# Patient Record
Sex: Female | Born: 1985 | Race: Black or African American | Hispanic: No | Marital: Married | Smoking: Current every day smoker
Health system: Southern US, Community
[De-identification: ages and names within clinical notes are randomized; demographics above are authoritative.]

## PROBLEM LIST (undated history)

## (undated) ENCOUNTER — Inpatient Hospital Stay (HOSPITAL_COMMUNITY): Payer: Self-pay

## (undated) DIAGNOSIS — Z8619 Personal history of other infectious and parasitic diseases: Secondary | ICD-10-CM

## (undated) DIAGNOSIS — N83209 Unspecified ovarian cyst, unspecified side: Secondary | ICD-10-CM

## (undated) DIAGNOSIS — O139 Gestational [pregnancy-induced] hypertension without significant proteinuria, unspecified trimester: Secondary | ICD-10-CM

## (undated) DIAGNOSIS — B999 Unspecified infectious disease: Secondary | ICD-10-CM

## (undated) DIAGNOSIS — O165 Unspecified maternal hypertension, complicating the puerperium: Secondary | ICD-10-CM

## (undated) DIAGNOSIS — D259 Leiomyoma of uterus, unspecified: Secondary | ICD-10-CM

## (undated) DIAGNOSIS — I10 Essential (primary) hypertension: Secondary | ICD-10-CM

## (undated) HISTORY — DX: Unspecified maternal hypertension, complicating the puerperium: O16.5

## (undated) HISTORY — PX: NO PAST SURGERIES: SHX2092

## (undated) HISTORY — DX: Essential (primary) hypertension: I10

---
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
## 2007-06-07 ENCOUNTER — Emergency Department (HOSPITAL_COMMUNITY): Admission: EM | Admit: 2007-06-07 | Discharge: 2007-06-07 | Payer: Self-pay | Admitting: Emergency Medicine

## 2010-02-24 ENCOUNTER — Emergency Department (HOSPITAL_COMMUNITY)
Admission: EM | Admit: 2010-02-24 | Discharge: 2010-02-25 | Payer: Self-pay | Source: Home / Self Care | Admitting: Emergency Medicine

## 2010-03-25 ENCOUNTER — Emergency Department (HOSPITAL_COMMUNITY)
Admission: EM | Admit: 2010-03-25 | Discharge: 2010-03-25 | Payer: Self-pay | Source: Home / Self Care | Admitting: Family Medicine

## 2010-03-25 LAB — POCT URINALYSIS DIPSTICK
Nitrite: NEGATIVE
Protein, ur: NEGATIVE mg/dL
Specific Gravity, Urine: 1.025 (ref 1.005–1.030)
Urine Glucose, Fasting: NEGATIVE mg/dL
Urobilinogen, UA: 1 mg/dL (ref 0.0–1.0)
pH: 6.5 (ref 5.0–8.0)

## 2010-03-25 LAB — WET PREP, GENITAL
Clue Cells Wet Prep HPF POC: NONE SEEN
Trich, Wet Prep: NONE SEEN
Yeast Wet Prep HPF POC: NONE SEEN

## 2010-03-25 LAB — POCT PREGNANCY, URINE: Preg Test, Ur: NEGATIVE

## 2010-04-04 LAB — GC/CHLAMYDIA PROBE AMP, GENITAL
Chlamydia, DNA Probe: NEGATIVE
GC Probe Amp, Genital: NEGATIVE

## 2010-12-12 LAB — URINALYSIS, ROUTINE W REFLEX MICROSCOPIC
Bilirubin Urine: NEGATIVE
Glucose, UA: NEGATIVE
Hgb urine dipstick: NEGATIVE
Ketones, ur: 15 — AB
Nitrite: NEGATIVE
Protein, ur: NEGATIVE
Specific Gravity, Urine: 1.029
Urobilinogen, UA: 1
pH: 7.5

## 2010-12-12 LAB — POCT PREGNANCY, URINE
Operator id: 234501
Preg Test, Ur: NEGATIVE

## 2012-09-22 ENCOUNTER — Other Ambulatory Visit (HOSPITAL_COMMUNITY)
Admission: RE | Admit: 2012-09-22 | Discharge: 2012-09-22 | Disposition: A | Discharge status: Home / Self Care | Payer: BC Managed Care – PPO | Source: Ambulatory Visit | Attending: Emergency Medicine | Admitting: Emergency Medicine

## 2012-09-22 ENCOUNTER — Encounter (HOSPITAL_COMMUNITY): Payer: Self-pay | Admitting: Emergency Medicine

## 2012-09-22 ENCOUNTER — Encounter (HOSPITAL_COMMUNITY): Payer: Self-pay | Admitting: *Deleted

## 2012-09-22 ENCOUNTER — Emergency Department (HOSPITAL_COMMUNITY)
Admission: EM | Admit: 2012-09-22 | Discharge: 2012-09-22 | Disposition: A | Discharge status: Nursing Home (Includes ICF) | Payer: BC Managed Care – PPO | Source: Home / Self Care

## 2012-09-22 ENCOUNTER — Emergency Department (HOSPITAL_COMMUNITY)
Admission: EM | Admit: 2012-09-22 | Discharge: 2012-09-22 | Discharge status: Against Medical Advice | Payer: BC Managed Care – PPO | Attending: Emergency Medicine | Admitting: Emergency Medicine

## 2012-09-22 DIAGNOSIS — N760 Acute vaginitis: Secondary | ICD-10-CM | POA: Insufficient documentation

## 2012-09-22 DIAGNOSIS — Z113 Encounter for screening for infections with a predominantly sexual mode of transmission: Secondary | ICD-10-CM | POA: Insufficient documentation

## 2012-09-22 DIAGNOSIS — R109 Unspecified abdominal pain: Secondary | ICD-10-CM | POA: Insufficient documentation

## 2012-09-22 DIAGNOSIS — Z3202 Encounter for pregnancy test, result negative: Secondary | ICD-10-CM | POA: Insufficient documentation

## 2012-09-22 DIAGNOSIS — R110 Nausea: Secondary | ICD-10-CM | POA: Insufficient documentation

## 2012-09-22 DIAGNOSIS — R300 Dysuria: Secondary | ICD-10-CM | POA: Insufficient documentation

## 2012-09-22 LAB — POCT PREGNANCY, URINE
Preg Test, Ur: NEGATIVE
Preg Test, Ur: NEGATIVE

## 2012-09-22 LAB — URINALYSIS, ROUTINE W REFLEX MICROSCOPIC
Glucose, UA: NEGATIVE mg/dL
Ketones, ur: 15 mg/dL — AB
Leukocytes, UA: NEGATIVE
Nitrite: NEGATIVE
Protein, ur: NEGATIVE mg/dL
Specific Gravity, Urine: 1.046 — ABNORMAL HIGH (ref 1.005–1.030)
Urobilinogen, UA: 0.2 mg/dL (ref 0.0–1.0)
pH: 5.5 (ref 5.0–8.0)

## 2012-09-22 LAB — CBC WITH DIFFERENTIAL/PLATELET
Basophils Absolute: 0 10*3/uL (ref 0.0–0.1)
Basophils Relative: 1 % (ref 0–1)
Eosinophils Absolute: 0.1 10*3/uL (ref 0.0–0.7)
Eosinophils Relative: 1 % (ref 0–5)
HCT: 39.9 % (ref 36.0–46.0)
Hemoglobin: 13.9 g/dL (ref 12.0–15.0)
Lymphocytes Relative: 34 % (ref 12–46)
Lymphs Abs: 2.9 10*3/uL (ref 0.7–4.0)
MCH: 30.3 pg (ref 26.0–34.0)
MCHC: 34.8 g/dL (ref 30.0–36.0)
MCV: 87.1 fL (ref 78.0–100.0)
Monocytes Absolute: 0.7 10*3/uL (ref 0.1–1.0)
Monocytes Relative: 8 % (ref 3–12)
Neutro Abs: 4.8 10*3/uL (ref 1.7–7.7)
Neutrophils Relative %: 57 % (ref 43–77)
Platelets: 263 10*3/uL (ref 150–400)
RBC: 4.58 MIL/uL (ref 3.87–5.11)
RDW: 12.2 % (ref 11.5–15.5)
WBC: 8.4 10*3/uL (ref 4.0–10.5)

## 2012-09-22 LAB — POCT I-STAT, CHEM 8
BUN: 6 mg/dL (ref 6–23)
Calcium, Ion: 1.16 mmol/L (ref 1.12–1.23)
Chloride: 102 mEq/L (ref 96–112)
Creatinine, Ser: 0.7 mg/dL (ref 0.50–1.10)
Glucose, Bld: 88 mg/dL (ref 70–99)
HCT: 43 % (ref 36.0–46.0)
Hemoglobin: 14.6 g/dL (ref 12.0–15.0)
Potassium: 3.6 mEq/L (ref 3.5–5.1)
Sodium: 141 mEq/L (ref 135–145)
TCO2: 26 mmol/L (ref 0–100)

## 2012-09-22 LAB — POCT URINALYSIS DIP (DEVICE)
Glucose, UA: NEGATIVE mg/dL
Ketones, ur: NEGATIVE mg/dL
Leukocytes, UA: NEGATIVE
Nitrite: NEGATIVE
Protein, ur: 30 mg/dL — AB
Specific Gravity, Urine: >=1.03 (ref 1.005–1.030)
Urobilinogen, UA: 0.2 mg/dL (ref 0.0–1.0)
pH: 5.5 (ref 5.0–8.0)

## 2012-09-22 LAB — URINE MICROSCOPIC-ADD ON

## 2012-09-22 LAB — POCT RAPID STREP A: Streptococcus, Group A Screen (Direct): NEGATIVE

## 2012-09-22 NOTE — ED Notes (Signed)
The p[t has had abd pain nausea temp,  Painful urination.  lmp nov

## 2012-09-22 NOTE — ED Notes (Signed)
ibu at 1530

## 2012-09-22 NOTE — ED Notes (Signed)
Pt c/o lower abdominal cramping since Saturday. Having nausea denies vomiting. Pt states that she has not had a cycle since November. Pt had one injection of depo and then discontinued.  Pt has used pain med left over from dental surgery which helped relieve pain.  Pt is sitting up right no signs of distress.

## 2012-09-23 NOTE — ED Provider Notes (Signed)
   History    CSN: [id number] Arrival date & time 09/22/12  1847  First MD Initiated Contact with Patient 09/22/12 1935     Chief Complaint  Patient presents with  . Abdominal Pain    lower abdominal pain since saturday. pain is a constant cramping sensation.    (Consider location/radiation/quality/duration/timing/severity/associated sxs/prior Treatment) HPI  27 yo bf presents with abd pain that has been worsening the last 24 yours.  Described as being constant with cramping.  Some nausea, and chills.  Denies vomiting, dysuria, hematuria, constipaton, diarrhea, bloody stools.  Has not had a cycle since November 2013.  No vaginal bleeding or discharge.  History reviewed. No pertinent past medical history. History reviewed. No pertinent past surgical history. History reviewed. No pertinent family history. History  Substance Use Topics  . Smoking status: Never Smoker   . Smokeless tobacco: Not on file  . Alcohol Use: No   OB History   Grav Para Term Preterm Abortions TAB SAB Ect Mult Living                 Review of Systems  Constitutional: Positive for chills. Negative for appetite change.  HENT: Negative.   Eyes: Negative.   Respiratory: Negative.   Cardiovascular: Negative.   Gastrointestinal: Positive for nausea and abdominal pain. Negative for vomiting, diarrhea, constipation, blood in stool, anal bleeding and rectal pain.  Endocrine: Negative.   Genitourinary: Positive for menstrual problem and pelvic pain. Negative for dysuria, hematuria, flank pain, vaginal bleeding, vaginal discharge, difficulty urinating and vaginal pain.  Musculoskeletal: Negative.   Skin: Negative.   Psychiatric/Behavioral: Negative.     Allergies  Review of patient's allergies indicates no known allergies.  Home Medications  No current outpatient prescriptions on file. BP 148/86  Pulse 86  Temp(Src) 100.3 F (37.9 C) (Oral)  Resp 16  SpO2 95%  LMP 01/24/2012 Physical Exam   Constitutional: She is oriented to person, place, and time. She appears well-developed and well-nourished.  HENT:  Head: Normocephalic.  Eyes: EOM are normal. Pupils are equal, round, and reactive to light.  Neck: Thyromegaly present.  Cardiovascular: Normal rate and regular rhythm.   Pulmonary/Chest: Effort normal and breath sounds normal.  Abdominal: Soft. Bowel sounds are normal. She exhibits no mass. There is tenderness (diffuse).  Musculoskeletal: Normal range of motion.  Neurological: She is alert and oriented to person, place, and time.  Skin: Skin is warm and dry.  Psychiatric: She has a normal mood and affect.    ED Course  Procedures (including critical care time) Labs Reviewed  POCT URINALYSIS DIP (DEVICE) - Abnormal; Notable for the following:    Bilirubin Urine SMALL (*)    Hgb urine dipstick MODERATE (*)    Protein, ur 30 (*)    All other components within normal limits  CBC WITH DIFFERENTIAL  POCT PREGNANCY, URINE  POCT I-STAT, CHEM 8  POCT RAPID STREP A ([facility] CARE ONLY)  CERVICOVAGINAL ANCILLARY ONLY   No results found. No diagnosis found.  MDM  With worsening abdominal pain and increased temp, we will send patient down to ED for furter evaluation.  Patient voices understanding.  Discussed with dr Tressia Danas.   Zonia Kief, PA-C 09/23/12 [phone]

## 2012-09-24 LAB — CULTURE, GROUP A STREP

## 2012-09-26 NOTE — ED Provider Notes (Signed)
Medical screening examination/treatment/procedure(s) were performed by non-physician practitioner and as supervising physician I was immediately available for consultation/collaboration.   [facility]; MD  Sharin Grave, MD 09/26/12 [phone]

## 2013-08-15 DIAGNOSIS — R42 Dizziness and giddiness: Secondary | ICD-10-CM | POA: Insufficient documentation

## 2014-06-28 ENCOUNTER — Emergency Department (HOSPITAL_COMMUNITY)
Admission: EM | Admit: 2014-06-28 | Discharge: 2014-06-28 | Disposition: A | Discharge status: Home / Self Care | Payer: Self-pay | Attending: Emergency Medicine | Admitting: Emergency Medicine

## 2014-06-28 ENCOUNTER — Encounter (HOSPITAL_COMMUNITY): Payer: Self-pay | Admitting: Emergency Medicine

## 2014-06-28 DIAGNOSIS — N898 Other specified noninflammatory disorders of vagina: Secondary | ICD-10-CM | POA: Insufficient documentation

## 2014-06-28 DIAGNOSIS — Z3202 Encounter for pregnancy test, result negative: Secondary | ICD-10-CM | POA: Insufficient documentation

## 2014-06-28 LAB — WET PREP, GENITAL
Clue Cells Wet Prep HPF POC: NONE SEEN
Trich, Wet Prep: NONE SEEN
Yeast Wet Prep HPF POC: NONE SEEN

## 2014-06-28 LAB — URINALYSIS, ROUTINE W REFLEX MICROSCOPIC
Bilirubin Urine: NEGATIVE
Glucose, UA: NEGATIVE mg/dL
Hgb urine dipstick: NEGATIVE
Ketones, ur: NEGATIVE mg/dL
Leukocytes, UA: NEGATIVE
Nitrite: NEGATIVE
Protein, ur: NEGATIVE mg/dL
Specific Gravity, Urine: 1.027 (ref 1.005–1.030)
Urobilinogen, UA: 0.2 mg/dL (ref 0.0–1.0)
pH: 5.5 (ref 5.0–8.0)

## 2014-06-28 LAB — RPR: RPR Ser Ql: NONREACTIVE

## 2014-06-28 MED ORDER — AZITHROMYCIN 250 MG PO TABS
1000.0000 mg | ORAL_TABLET | Freq: Once | ORAL | Status: AC
  Administered 2014-06-28: 1000 mg via ORAL
  Filled 2014-06-28: qty 4

## 2014-06-28 MED ORDER — CEFTRIAXONE SODIUM 250 MG IJ SOLR
250.0000 mg | Freq: Once | INTRAMUSCULAR | Status: AC
  Administered 2014-06-28: 250 mg via INTRAMUSCULAR
  Filled 2014-06-28: qty 250

## 2014-06-28 NOTE — ED Provider Notes (Signed)
CSN: [id number]     Arrival date & time 06/28/14  9485 History   First MD Initiated Contact with Patient 06/28/14 [phone]     Chief Complaint  Patient presents with  . Vaginal Itching     (Consider location/radiation/quality/duration/timing/severity/associated sxs/prior Treatment) HPI Comments: Yellow clumpy d/c, since Friday. Itching since Wednesday Unprotected sex 1 wk ago No fevers, no ha, no sob / joint pain / cough.   Constant Mild Nothing makes better or worse. Tried monostat without improvement.   Patient is a 29 y.o. female presenting with vaginal itching. The history is provided by the patient.  Vaginal Itching    History reviewed. No pertinent past medical history. History reviewed. No pertinent past surgical history. No family history on file. History  Substance Use Topics  . Smoking status: Never Smoker   . Smokeless tobacco: Not on file  . Alcohol Use: No   OB History    No data available     Review of Systems  All other systems reviewed and are negative.     Allergies  Review of patient's allergies indicates no known allergies.  Home Medications   Prior to Admission medications   Not on File   BP 127/86 mmHg  Pulse 58  Temp(Src) 98.2 F (36.8 C) (Oral)  SpO2 100%  LMP 06/03/2014 (Exact Date) Physical Exam  Constitutional: She appears well-developed and well-nourished. No distress.  HENT:  Head: Normocephalic and atraumatic.  Mouth/Throat: Oropharynx is clear and moist. No oropharyngeal exudate.  Eyes: Conjunctivae and EOM are normal. Pupils are equal, round, and reactive to light. Right eye exhibits no discharge. Left eye exhibits no discharge. No scleral icterus.  Neck: Normal range of motion. Neck supple. No JVD present. No thyromegaly present.  Cardiovascular: Normal rate, regular rhythm, normal heart sounds and intact distal pulses.  Exam reveals no gallop and no friction rub.   No murmur heard. Pulmonary/Chest: Effort normal and  breath sounds normal. No respiratory distress. She has no wheezes. She has no rales.  Abdominal: Soft. Bowel sounds are normal. She exhibits no distension and no mass. There is no tenderness.  Genitourinary:  Chaperone present for exam, copious yello and green d/c, no bleeding, no CMT or adnexal ttp or fullness.  Musculoskeletal: Normal range of motion. She exhibits no edema or tenderness.  Lymphadenopathy:    She has no cervical adenopathy.  Neurological: She is alert. Coordination normal.  Skin: Skin is warm and dry. No rash noted. No erythema.  Psychiatric: She has a normal mood and affect. Her behavior is normal.  Nursing note and vitals reviewed.   ED Course  Procedures (including critical care time) Labs Review Labs Reviewed  WET PREP, GENITAL - Abnormal; Notable for the following:    WBC, Wet Prep HPF POC FEW (*)    All other components within normal limits  URINALYSIS, ROUTINE W REFLEX MICROSCOPIC  RPR  HIV ANTIBODY (ROUTINE TESTING)  POC URINE PREG, ED  GC/CHLAMYDIA PROBE AMP ([place])    Imaging Review No results found.   MDM   Final diagnoses:  Vaginal discharge    STD testing pending, vital signs normal, patient otherwise unremarkable  Wet prep negative - VS normal, UA neg - preg neg - cover for STD'  Meds given in ED:  Medications  azithromycin (ZITHROMAX) tablet 1,000 mg (not administered)  cefTRIAXone (ROCEPHIN) injection 250 mg (not administered)    New Prescriptions   No medications on file       Noemi Chapel, MD  06/28/14 0628 

## 2014-06-28 NOTE — ED Notes (Signed)
Vag itching with yellowish discharge-- has had unprotected sex 2 weeks.

## 2014-06-28 NOTE — Discharge Instructions (Signed)
Pelvic Infection ° °If you have been diagnosed with a pelvic infection such as a sexually transmitted disease, you will need to be treated with antibiotics. Please take the medicines as prescribed. Some of these tests do not come back for 1-2 days in which case if they turn positive you will receive a phone call to let you know. If you are contacted and do have an infection consistent with a sexually transmitted disease, then you will need to tell any and all sexual partners that you have had in the last 6 months no so that they can be tested and treated as well. If you should develop severe or worsening pain in your abdomen or the pelvis or develop severe fevers,nausea or vomiting that prevent you from taking your medications, return to the emergency department immediately. Otherwise contact your local physician or county health department for a follow up appointment to complete STD testing including HIV and syphilis.  See the list of phone numbers below. ° °RESOURCE GUIDE ° °Dental Problems ° °Patients with Medicaid: °[facility]                     [facility] °[address]                                           [address] °Phone:  [phone]                                                  Phone:  [phone] ° °If unable to pay or uninsured, contact:  [facility] or [facility] to become qualified for the adult dental clinic. ° °Chronic Pain Problems °Contact [facility]  [phone] °Patients need to be referred by their primary care doctor. ° °Insufficient Money for Medicine °Contact [employer]:  call "211" or [facility] 271-5999. ° °No Primary Care Doctor °Call [facility]  832-8000 °Other agencies that provide inexpensive medical care °   [facility]  [phone] °   [facility]  [phone] °   [facility]  [phone] °   [facility]  [phone] °   [facility]  [phone] ° [facility]  [phone] ° °Psychological Services °[facility]  [phone] °[facility]  [phone] °[facility]   [phone] (emergency services [phone]) ° °Substance Abuse Resources °[facility]  336-882-2125 °[facility] 336-784-9470 °[facility] 336-285-9073 °[facility] 336-845-3988 °Residential & Outpatient Substance Abuse Program  800-659-3381 ° °Abuse/Neglect °[place] [facility] (336) 641-3795 °[place] [facility] Hotline 800-378-5315 (After Hours) ° °Emergency Shelter °[facility] (336) 271-5985 ° °Maternity Homes °[facility] (336) 275-9566 °[facility] (704) 372-4663 ° °MRSA Hotline #:   832-7006 ° ° ° °[place] [facility] ° °[facility] [place] [facility]     [employer]                          [facility] °[address] [place]                         [address]      [address]  °[place]                                                [place]                            [place] °Phone:  349-3220                                   Phone:  [phone]                 Phone:  342-8140 ° °[facility] °Phone:  342-8316 ° °[place] Child Abuse Hotline °(336) 342-1394 °(336) 342-3537 (After Hours) ° ° ° °

## 2014-06-30 LAB — HIV ANTIBODY (ROUTINE TESTING W REFLEX): HIV Screen 4th Generation wRfx: NONREACTIVE

## 2015-01-30 ENCOUNTER — Emergency Department (HOSPITAL_COMMUNITY)
Admission: EM | Admit: 2015-01-30 | Discharge: 2015-01-30 | Disposition: A | Discharge status: Home / Self Care | Payer: Self-pay | Attending: Emergency Medicine | Admitting: Emergency Medicine

## 2015-01-30 ENCOUNTER — Encounter (HOSPITAL_COMMUNITY): Payer: Self-pay | Admitting: Emergency Medicine

## 2015-01-30 DIAGNOSIS — K0889 Other specified disorders of teeth and supporting structures: Secondary | ICD-10-CM | POA: Insufficient documentation

## 2015-01-30 DIAGNOSIS — Z720 Tobacco use: Secondary | ICD-10-CM | POA: Insufficient documentation

## 2015-01-30 DIAGNOSIS — H9202 Otalgia, left ear: Secondary | ICD-10-CM | POA: Insufficient documentation

## 2015-01-30 MED ORDER — BUPIVACAINE HCL (PF) 0.5 % IJ SOLN
30.0000 mL | Freq: Once | INTRAMUSCULAR | Status: AC
  Administered 2015-01-30: 30 mL
  Filled 2015-01-30: qty 30

## 2015-01-30 MED ORDER — IBUPROFEN 400 MG PO TABS
600.0000 mg | ORAL_TABLET | Freq: Once | ORAL | Status: AC
  Administered 2015-01-30: 600 mg via ORAL
  Filled 2015-01-30 (×2): qty 1

## 2015-01-30 MED ORDER — BUPIVACAINE HCL 0.5 % IJ SOLN
50.0000 mL | Freq: Once | INTRAMUSCULAR | Status: DC
  Filled 2015-01-30: qty 50

## 2015-01-30 MED ORDER — IBUPROFEN 600 MG PO TABS: 600.0000 mg | ORAL_TABLET | Freq: Three times a day (TID) | ORAL | Status: DC | PRN

## 2015-01-30 MED ORDER — PENICILLIN V POTASSIUM 500 MG PO TABS: 500.0000 mg | ORAL_TABLET | Freq: Three times a day (TID) | ORAL | Status: DC

## 2015-01-30 MED ORDER — PENICILLIN V POTASSIUM 250 MG PO TABS
500.0000 mg | ORAL_TABLET | Freq: Once | ORAL | Status: AC
  Administered 2015-01-30: 500 mg via ORAL
  Filled 2015-01-30: qty 2

## 2015-01-30 NOTE — ED Notes (Signed)
Pt reports dental pain x 1 month that has progressed into L ear pain x 4 days; pt states decreased sleep d/t pain; ibuprofen "helps but wears off quickly"

## 2015-01-30 NOTE — ED Provider Notes (Signed)
CSN: [id number]     Arrival date & time 01/30/15  [id number] History   First MD Initiated Contact with Patient 01/30/15 [phone]     Chief Complaint  Patient presents with  . Dental Pain  . Otalgia     HPI Patient presents to the emergency department complaining of left upper molar dental pain worsening over the past 2 weeks.  She states the pain radiates which her left ear.  No fevers or chills.  She takes ibuprofen and this helps.  She has not seen a dentist.  No other complaints at this time.  No facial swelling and neck swelling.   History reviewed. No pertinent past medical history. History reviewed. No pertinent past surgical history. History reviewed. No pertinent family history. Social History  Substance Use Topics  . Smoking status: Current Some Day Smoker -- 0.50 packs/day    Types: Cigarettes  . Smokeless tobacco: None  . Alcohol Use: Yes     Comment: occassionally   OB History    No data available     Review of Systems  All other systems reviewed and are negative.     Allergies  Review of patient's allergies indicates no known allergies.  Home Medications   Prior to Admission medications   Medication Sig Start Date End Date Taking? Authorizing Provider  ibuprofen (ADVIL,MOTRIN) 600 MG tablet Take 1 tablet (600 mg total) by mouth every 8 (eight) hours as needed. 01/30/15   Jola Schmidt, MD  penicillin v potassium (VEETID) 500 MG tablet Take 1 tablet (500 mg total) by mouth 3 (three) times daily. 01/30/15   Jola Schmidt, MD   BP 129/82 mmHg  Pulse 65  Temp(Src) 97.9 F (36.6 C) (Oral)  Resp 16  SpO2 99%  LMP 01/19/2015 Physical Exam  Constitutional: She is oriented to person, place, and time. She appears well-developed and well-nourished.  HENT:  Head: Normocephalic.  Eyes: EOM are normal.  Neck: Normal range of motion.  Pulmonary/Chest: Effort normal.  Abdominal: She exhibits no distension.  Musculoskeletal: Normal range of motion.  Neurological: She is  alert and oriented to person, place, and time.  Psychiatric: She has a normal mood and affect.  Nursing note and vitals reviewed.   ED Course  Procedures (including critical care time)  DENTAL NERVE BLOCK Performed by: Hoy Morn Consent: Verbal consent obtained. Required items: required blood products, implants, devices, and special equipment available Time out: Immediately prior to procedure a "time out" was called to verify the correct patient, procedure, equipment, support staff and site/side marked as required. Indication: Dental pain  Nerve block body site: Dental nerve  Preparation: Patient was prepped and draped in the usual sterile fashion. Needle gauge: 24 G Location technique: anatomical landmarks Local anesthetic: Bupivacaine 0.5% without epi  Anesthetic total: 1.5 ml Outcome: pain improved Patient tolerance: Patient tolerated the procedure well with no immediate complications.    Labs Review Labs Reviewed - No data to display  Imaging Review No results found. I have personally reviewed and evaluated these images and lab results as part of my medical decision-making.   EKG Interpretation None      MDM   Final diagnoses:  Pain, dental    Dental Pain. Home with antibiotics and pain medicine. Recommend dental follow up. No signs of gingival abscess. Tolerating secretions. Airway patent. No sub lingular swelling     Jola Schmidt, MD 01/30/15 [phone]

## 2015-02-16 ENCOUNTER — Other Ambulatory Visit: Payer: Self-pay | Admitting: *Deleted

## 2015-02-16 ENCOUNTER — Telehealth: Payer: Self-pay | Admitting: *Deleted

## 2015-02-16 DIAGNOSIS — H9203 Otalgia, bilateral: Secondary | ICD-10-CM

## 2015-02-16 NOTE — Telephone Encounter (Signed)
NCM called to inform pt of referral sent to Dr Maurilio Lovely office.  Instructed pt to make appointment within 48 hours to receive care.

## 2015-02-17 ENCOUNTER — Emergency Department (HOSPITAL_COMMUNITY)
Admission: EM | Admit: 2015-02-17 | Discharge: 2015-02-17 | Disposition: A | Discharge status: Home / Self Care | Payer: Self-pay | Attending: Emergency Medicine | Admitting: Emergency Medicine

## 2015-02-17 ENCOUNTER — Encounter (HOSPITAL_COMMUNITY): Payer: Self-pay

## 2015-02-17 DIAGNOSIS — F17210 Nicotine dependence, cigarettes, uncomplicated: Secondary | ICD-10-CM | POA: Insufficient documentation

## 2015-02-17 DIAGNOSIS — N760 Acute vaginitis: Secondary | ICD-10-CM | POA: Insufficient documentation

## 2015-02-17 DIAGNOSIS — B379 Candidiasis, unspecified: Secondary | ICD-10-CM

## 2015-02-17 DIAGNOSIS — Z3202 Encounter for pregnancy test, result negative: Secondary | ICD-10-CM | POA: Insufficient documentation

## 2015-02-17 DIAGNOSIS — B373 Candidiasis of vulva and vagina: Secondary | ICD-10-CM | POA: Insufficient documentation

## 2015-02-17 DIAGNOSIS — B9689 Other specified bacterial agents as the cause of diseases classified elsewhere: Secondary | ICD-10-CM

## 2015-02-17 LAB — COMPREHENSIVE METABOLIC PANEL
ALT: 14 U/L (ref 14–54)
AST: 21 U/L (ref 15–41)
Albumin: 4 g/dL (ref 3.5–5.0)
Alkaline Phosphatase: 72 U/L (ref 38–126)
Anion gap: 5 (ref 5–15)
BUN: 9 mg/dL (ref 6–20)
CO2: 29 mmol/L (ref 22–32)
Calcium: 9.2 mg/dL (ref 8.9–10.3)
Chloride: 105 mmol/L (ref 101–111)
Creatinine, Ser: 0.58 mg/dL (ref 0.44–1.00)
GFR calc Af Amer: >60 mL/min (ref >60)
GFR calc non Af Amer: >60 mL/min (ref >60)
Glucose, Bld: 97 mg/dL (ref 65–99)
Potassium: 3.8 mmol/L (ref 3.5–5.1)
Sodium: 139 mmol/L (ref 135–145)
Total Bilirubin: 0.5 mg/dL (ref 0.3–1.2)
Total Protein: 6.9 g/dL (ref 6.5–8.1)

## 2015-02-17 LAB — CBC
HCT: 41.1 % (ref 36.0–46.0)
Hemoglobin: 14.2 g/dL (ref 12.0–15.0)
MCH: 29.8 pg (ref 26.0–34.0)
MCHC: 34.5 g/dL (ref 30.0–36.0)
MCV: 86.3 fL (ref 78.0–100.0)
Platelets: 311 10*3/uL (ref 150–400)
RBC: 4.76 MIL/uL (ref 3.87–5.11)
RDW: 12.4 % (ref 11.5–15.5)
WBC: 10.3 10*3/uL (ref 4.0–10.5)

## 2015-02-17 LAB — URINALYSIS, ROUTINE W REFLEX MICROSCOPIC
Bilirubin Urine: NEGATIVE
Glucose, UA: NEGATIVE mg/dL
Hgb urine dipstick: NEGATIVE
Ketones, ur: NEGATIVE mg/dL
Leukocytes, UA: NEGATIVE
Nitrite: NEGATIVE
Protein, ur: NEGATIVE mg/dL
Specific Gravity, Urine: 1.024 (ref 1.005–1.030)
pH: 6 (ref 5.0–8.0)

## 2015-02-17 LAB — WET PREP, GENITAL
Sperm: NONE SEEN
Trich, Wet Prep: NONE SEEN
Yeast Wet Prep HPF POC: NONE SEEN

## 2015-02-17 LAB — I-STAT BETA HCG BLOOD, ED (MC, WL, AP ONLY): I-stat hCG, quantitative: <5 m[IU]/mL (ref <5)

## 2015-02-17 LAB — LIPASE, BLOOD: Lipase: 29 U/L (ref 11–51)

## 2015-02-17 MED ORDER — FLUCONAZOLE 200 MG PO TABS
200.0000 mg | ORAL_TABLET | Freq: Once | ORAL | Status: AC
  Administered 2015-02-17: 200 mg via ORAL
  Filled 2015-02-17: qty 1

## 2015-02-17 MED ORDER — METRONIDAZOLE 500 MG PO TABS: 500.0000 mg | ORAL_TABLET | Freq: Three times a day (TID) | ORAL | Status: DC

## 2015-02-17 MED ORDER — METRONIDAZOLE 500 MG PO TABS
500.0000 mg | ORAL_TABLET | Freq: Once | ORAL | Status: AC
  Administered 2015-02-17: 500 mg via ORAL
  Filled 2015-02-17: qty 1

## 2015-02-17 MED ORDER — CEFTRIAXONE SODIUM 250 MG IJ SOLR
250.0000 mg | Freq: Once | INTRAMUSCULAR | Status: AC
  Administered 2015-02-17: 250 mg via INTRAMUSCULAR
  Filled 2015-02-17: qty 250

## 2015-02-17 MED ORDER — STERILE WATER FOR INJECTION IJ SOLN
INTRAMUSCULAR | Status: AC
  Administered 2015-02-17: 10 mL
  Filled 2015-02-17: qty 10

## 2015-02-17 MED ORDER — AZITHROMYCIN 600 MG PO TABS
1200.0000 mg | ORAL_TABLET | Freq: Once | ORAL | Status: AC
  Administered 2015-02-17: 1200 mg via ORAL
  Filled 2015-02-17: qty 2

## 2015-02-17 NOTE — ED Notes (Signed)
Pt c/o lower abdominal cramping and vaginal discharge x 1 week.  Pain score 6/10.  Pt reports discharge is whitish-yellow and denies odor.  Pt reports using Monistat w/o relief.  Sts she was recently on penicillin for a tooth.

## 2015-02-17 NOTE — Discharge Instructions (Signed)
°Emergency Department Resource Guide °1) Find a Doctor and Pay Out of Pocket °Although you won't have to find out who is covered by your insurance plan, it is a good idea to ask around and get recommendations. You will then need to call the office and see if the doctor you have chosen will accept you as a new patient and what types of options they offer for patients who are self-pay. Some doctors offer discounts or will set up payment plans for their patients who do not have insurance, but you will need to ask so you aren't surprised when you get to your appointment. ° °2) Contact Your Local Health Department °Not all health departments have doctors that can see patients for sick visits, but many do, so it is worth a call to see if yours does. If you don't know where your local health department is, you can check in your phone book. The CDC also has a tool to help you locate your state's health department, and many state websites also have listings of all of their local health departments. ° °3) Find a Walk-in Clinic °If your illness is not likely to be very severe or complicated, you may want to try a walk in clinic. These are popping up all over the country in pharmacies, drugstores, and shopping centers. They're usually staffed by nurse practitioners or physician assistants that have been trained to treat common illnesses and complaints. They're usually fairly quick and inexpensive. However, if you have serious medical issues or chronic medical problems, these are probably not your best option. ° °No Primary Care Doctor: °- Call [facility] at  832-8000 - they can help you locate a primary care doctor that  accepts your insurance, provides certain services, etc. °- Physician Referral Service- 1-800-533-3463 ° °Chronic Pain Problems: °Organization         Address  Phone   Notes  °[facility]  (336) 297-2271 Patients need to be referred by their primary care doctor.  ° °Medication  Assistance: °Organization         Address  Phone   Notes  °[place] [facility] [address] °[place], [place] [address] (336) 641-8030 --Must be a resident of [place] °-- Must have NO insurance coverage whatsoever (no Medicaid/ Medicare, etc.) °-- The pt. MUST have a primary care doctor that directs their care regularly and follows them in the community °  °[facility]  (866) 331-1348   °[employer]  (888) 892-1162   ° °Agencies that provide inexpensive medical care: °Organization         Address  Phone   Notes  °[facility]  (336) 832-8035   °[facility]    (336) 832-7272   °[facility] [address] °[place], [place] [address] (336) 832-4777   °[facility] [address], °[place] (336) 271-4999   °[facility]    (336) 373-0678   °[facility]    (336) 272-1050   °[facility] ° [address], [place] Phone:  (336) 832-4444, Fax:  (336) 832-4440 Hours of Operation:  9 am - 6 pm, M-F.  Also accepts Medicaid/Medicare and self-pay.  °[facility] ° [address],  Phone: (336) 832-3150, Fax: (336) 832-3151. Hours of Operation:  8:30 am - 5:30 pm, M-F.  Also accepts Medicaid and self-pay.  °[facility] [place] [address], [place] Phone: (336) 878-6027   °[facility] [address], [place], [place] (336)723-1848, Ext. 123 Mondays & Thursdays: 7-9 AM.  First 15 patients are seen on a first come, first serve basis. °  ° °Medicaid-accepting [place] Providers: ° °Organization         Address  Phone   Notes  °[facility] [address], [place] (336) 641-2100 Also accepts self-pay patients.  °[facility] [address], [place] ° (336) 856-9996   °[facility] [address], [place]  (336) 288-8857   °[facility] [address] [place] [address], [place] (336) 299-7000   °Veita Bland [address], [place]  ° (336) 373-1557 Only accepts [place] Access Medicaid patients after they have their name applied to their card.  ° °Self-Pay (no insurance) in [place]: ° °Organization         Address  Phone   Notes  °Sickle Cell Patients, [facility] [address], [place] (336) 832-1970   °[facility] [address], [place] (336) 832-4400   °[facility] [place] ° [address], [place] (336) 992-4800   °[facility]/Dr. Osei-Bonsu ° [address] [place] [address], [place] or [address], [place] (336) 841-8500 Phone number for both [place] and [place] locations is the same.  °[facility] [address], [place] (336) 299-0000   °[facility] [address] [place] [address], [place] or [address] (336) 852-7530 °(336) 878-2260   °[facility] [address], [place] (336) 350-1642, phone; (336) 294-5005, fax Sees patients 1st and 3rd Saturday of every month.  Must not qualify for public or private insurance (i.e. Medicaid, Medicare, [place] Health Choice, Veterans' Benefits) • Household income should be no more than 200% of the poverty level •The clinic cannot treat you if you are pregnant or think you are pregnant • Sexually transmitted diseases are not treated at the clinic.  ° ° °Dental Care: °Organization         Address  Phone  Notes  °[place] [facility] [address], [place] (336) 641-6152 Accepts children up to age 21 who are enrolled in Medicaid or [place] Health Choice; pregnant women with a Medicaid card; and children who have applied for Medicaid or [place] Health Choice, but were declined, whose parents can pay a reduced fee at time of service.  °[place]  [facility] [place]  [address], [place] (336) 641-7733 Accepts children up to age 21 who are enrolled in Medicaid or [place] Health Choice; pregnant women with a Medicaid card; and children who have applied for Medicaid or [place] Health Choice, but were declined, whose parents can pay a reduced fee at time of service.  °[facility] ° [address],  (336) 641-4533 Patients are seen by appointment only. Walk-ins are not accepted. [facility] will see patients 18 years of age and older. °Monday - Tuesday (8am-5pm) °Most Wednesdays (8:30-5pm) °$30 per visit, cash only  °[facility] ° [address], [place] (336) 641-4533 Patients are seen by appointment only. Walk-ins are not accepted. [facility] will see patients 18 years of age and older. °One   Wednesday Evening (Monthly: Volunteer Based).  $30 per visit, cash only  °[facility]  (919) 537-3737 for adults; Children under age 4, call Graduate Pediatric Dentistry at (919) [phone]. Children aged 4-14, please call (919) [phone] to request a pediatric application. ° Dental services are provided in all areas of dental care including fillings, crowns and bridges, complete and partial dentures, implants, gum treatment, root canals, and extractions. Preventive care is also provided. Treatment is provided to both adults and children. °Patients are selected via a lottery and there is often a waiting list. °  °[facility] [address], °[place] ° (336) 763-8833 [email or web address] °  °[facility] [address], [place], [place] (336)723-1848, Ext. 123 Second and Fourth Thursday of each month, opens at 6:30 AM; Clinic ends at 9 AM.  Patients are seen on a first-come first-served basis, and a limited number are seen during each clinic.  ° °[facility] ° [address], [place], [place] (336) 723-7904    Eligibility Requirements °You must have lived in [place], or [place] counties for at least the last three months. °  You cannot be eligible for state or federal sponsored healthcare insurance, including Veterans Administration, Medicaid, or Medicare. °  You generally cannot be eligible for healthcare insurance through your employer.  °  How to apply: °Eligibility screenings are held every Tuesday and Wednesday afternoon from 1:00 pm until 4:00 pm. You do not need an appointment for the interview!  °[facility] [address], [place], [place] 336-631-2330   °[place] [facility]  336-342-8273   °[facility]  336-703-3100   °[facility]  336-570-6415   ° °Behavioral Health Resources in the Community: °Intensive Outpatient Programs °Organization         Address  Phone  Notes  °[facility] [address], [place], [place] 336-878-6098   °[facility] [address], [place], [place] 336-832-9800   °[facility] [address], [place], [place] ° 336-882-2125   °[facility] [address],  °[place], [place] 1-800-853-5163 or 336-641-4981   °Substance Abuse Resources °Organization         Address  Phone  Notes  °[facility]  336-882-2125   °[facility]  336-784-9470   °[facility]  336-285-9073   °[facility]  336-845-3988   °Residential & Outpatient Substance Abuse Program  1-800-659-3381   °Psychological Services °Organization         Address  Phone  Notes  °[facility]  336- 832-9600   °[facility]  336- 378-7881   °[facility] [address], [place] 1-800-853-5163 or 336-641-4981   ° °Mobile Crisis Teams °Organization         Address  Phone  Notes  °[facility], Mobile Crisis Care Unit  1-877-626-1772   °Assertive °[facility] ° [address]  [place], [place] 336-834-9664   °Sharon DeEsch [address] ° [place] 336-554-5454   ° °Self-Help/Support Groups °Organization         Address  Phone             Notes  °[facility]  - variety of support groups  336- 373-1402 Call for more information  °Narcotics Anonymous (NA), [facility] [address], °[place]  2 meetings at this location  ° °  Residential Treatment Programs °Organization         Address  Phone  Notes  °[facility] [address],    °[place]  1-866-801-8205   °[facility] ° [address], [place], [place] 704-293-8524   °[facility] [address], [place] 336-845-3988 Admissions: 8am-3pm M-F  °[facility] [address]    °[place], [place] 336-841-1104   °[facility] [address], [place], [place] 336-379-7146   °[facility] [address]  °[place], [place] 336-285-9073   °[facility] Intensive Outpatient [address], [place], [place] 336-852-3033   °[facility] [address]  °[place], [place] 1-877-615-2722 or 336-784-9470   °[facility] [address] [place], [place] 336-227-7417 Accepts Medicaid  °[facility] [address]  °[place] 1-800-659-3381 Substance Abuse/Addiction Treatment  ° °[place] Behavioral Health Resources °Organization         Address  Phone  Notes  °[facility]  (888) 581-9988   °Julie Brannon, PhD [address] [place], [place]   (336) 349-5553 or (336) 951-0000   °[facility]   [address] °[place], [place] (336) 349-4454   °[facility] [address], [place], [place] (336) 342-8316 Insurance/Medicaid/sponsorship through [facility]  °[facility] [address]                                    [place], [place] (336) 342-8316 Therapy/tele-psych/case    °[facility] [address]  ° [place],  (336) 349-2233    °Dr. Arfeen  (336) 349-4544   °[facility] [place]  [employer] [place] [facility] 1) [address], [place] °2) [address], [place] °3)  [address], [place] (336) 349-3220 °(336) 342-7768 ° °(336) 342-8140   °[place] [facility] (336) 342-1394 or (336) 342-3537 (After Hours)    ° ° °

## 2015-02-17 NOTE — ED Provider Notes (Signed)
CSN: [id number]     Arrival date & time 02/17/15  1842 History   First MD Initiated Contact with Patient 02/17/15 2049     Chief Complaint  Patient presents with  . Abdominal Pain  . Vaginal Discharge     (Consider location/radiation/quality/duration/timing/severity/associated sxs/prior Treatment) Patient is a 29 y.o. female presenting with abdominal pain and vaginal discharge.  Abdominal Pain Associated symptoms: nausea (with pain meds and abx) and vaginal discharge   Associated symptoms: no cough, no dysuria, no fever, no shortness of breath and no vomiting   Vaginal discharge:    Quality:  Milky, thick and white   Severity:  Moderate   Duration:  1 week   Timing:  Constant   Progression:  Waxing and waning   Chronicity:  New Risk factors: no alcohol abuse, no NSAID use and not obese   Vaginal Discharge Associated symptoms: abdominal pain (suprapubic) and nausea (with pain meds and abx)   Associated symptoms: no dyspareunia, no dysuria, no fever and no vomiting     History reviewed. No pertinent past medical history. History reviewed. No pertinent past surgical history. History reviewed. No pertinent family history. Social History  Substance Use Topics  . Smoking status: Current Some Day Smoker -- 0.50 packs/day    Types: Cigarettes  . Smokeless tobacco: None  . Alcohol Use: Yes     Comment: occassionally   OB History    No data available     Review of Systems  Constitutional: Negative for fever.  Eyes: Negative for photophobia and pain.  Respiratory: Negative for cough and shortness of breath.   Gastrointestinal: Positive for nausea (with pain meds and abx) and abdominal pain (suprapubic). Negative for vomiting.  Genitourinary: Positive for vaginal discharge, vaginal pain and pelvic pain. Negative for dysuria and dyspareunia.  Neurological: Negative for light-headedness and headaches.  All other systems reviewed and are negative.     Allergies  Review of  patient's allergies indicates no known allergies.  Home Medications   Prior to Admission medications   Medication Sig Start Date End Date Taking? Authorizing Provider  HYDROcodone-acetaminophen (NORCO/VICODIN) 5-325 MG tablet Take 0.5 tablets by mouth every 6 (six) hours as needed for moderate pain or severe pain.   Yes Historical Provider, MD  ibuprofen (ADVIL,MOTRIN) 600 MG tablet Take 1 tablet (600 mg total) by mouth every 8 (eight) hours as needed. 01/30/15  Yes Jola Schmidt, MD  metroNIDAZOLE (FLAGYL) 500 MG tablet Take 1 tablet (500 mg total) by mouth 3 (three) times daily. 02/17/15   Corene Cornea Jocsan Mcginley, MD   BP 140/96 mmHg  Pulse 62  Temp(Src) 98.2 F (36.8 C) (Oral)  Resp 14  SpO2 100%  LMP 02/14/2015 Physical Exam  Constitutional: She is oriented to person, place, and time. She appears well-developed and well-nourished.  HENT:  Head: Normocephalic and atraumatic.  Eyes: Conjunctivae are normal. Pupils are equal, round, and reactive to light.  Neck: Normal range of motion.  Cardiovascular: Normal rate and regular rhythm.   Pulmonary/Chest: Effort normal and breath sounds normal. No stridor. No respiratory distress.  Abdominal: Soft. She exhibits no distension. There is no tenderness.  Genitourinary: There is no rash or tenderness on the right labia. There is no rash or tenderness on the left labia. Right adnexum displays no mass and no tenderness. Left adnexum displays no mass and no tenderness. No tenderness or bleeding in the vagina. Vaginal discharge found.  Musculoskeletal: Normal range of motion. She exhibits no edema or tenderness.  Neurological: She  is alert and oriented to person, place, and time. No cranial nerve deficit.  Skin: Skin is warm.  Nursing note and vitals reviewed.   ED Course  Procedures (including critical care time) Labs Review Labs Reviewed  WET PREP, GENITAL - Abnormal; Notable for the following:    Clue Cells Wet Prep HPF POC PRESENT (*)    WBC, Wet  Prep HPF POC MODERATE (*)    All other components within normal limits  LIPASE, BLOOD  COMPREHENSIVE METABOLIC PANEL  CBC  URINALYSIS, ROUTINE W REFLEX MICROSCOPIC (NOT AT [facility])  RPR  HIV ANTIBODY (ROUTINE TESTING)  I-STAT BETA HCG BLOOD, ED ([facility], [facility], [facility] ONLY)  GC/CHLAMYDIA PROBE AMP ([place]) NOT AT [facility]    Imaging Review No results found. I have personally reviewed and evaluated these images and lab results as part of my medical decision-making.   EKG Interpretation None      MDM   Final diagnoses:  Bacterial vaginosis  Yeast infection    29 year old female here with vaginal discharge.  Abdominal pain after being on antibiotics for a dental infection a week and half ago. Has had yeast infections before and presents mildly. Has cramping with her periods were she just finished however not to this extent. Exam as documented above. Found to have bacterial vaginosis, yeast infection. Prophylactically treated for gonorrhea and chlamydia. RPR and HIV are pending. No evidence of PID.    Merrily Pew, MD 02/17/15 [phone]

## 2015-02-18 LAB — RPR: RPR Ser Ql: NONREACTIVE

## 2015-02-18 LAB — HIV ANTIBODY (ROUTINE TESTING W REFLEX): HIV Screen 4th Generation wRfx: NONREACTIVE

## 2015-02-18 LAB — GC/CHLAMYDIA PROBE AMP (~~LOC~~) NOT AT ARMC
Chlamydia: NEGATIVE
Neisseria Gonorrhea: NEGATIVE

## 2015-10-23 LAB — CYTOLOGY - PAP: Pap: NEGATIVE

## 2015-12-15 DIAGNOSIS — Z3490 Encounter for supervision of normal pregnancy, unspecified, unspecified trimester: Secondary | ICD-10-CM | POA: Insufficient documentation

## 2015-12-15 LAB — OB RESULTS CONSOLE GC/CHLAMYDIA
Chlamydia: NEGATIVE
Gonorrhea: NEGATIVE

## 2015-12-15 LAB — OB RESULTS CONSOLE RUBELLA ANTIBODY, IGM: Rubella: IMMUNE

## 2015-12-15 LAB — OB RESULTS CONSOLE HEPATITIS B SURFACE ANTIGEN: Hepatitis B Surface Ag: NEGATIVE

## 2016-02-10 ENCOUNTER — Encounter (HOSPITAL_COMMUNITY): Payer: Self-pay | Admitting: Emergency Medicine

## 2016-02-10 ENCOUNTER — Emergency Department (HOSPITAL_COMMUNITY)
Admission: EM | Admit: 2016-02-10 | Discharge: 2016-02-10 | Disposition: A | Discharge status: Home / Self Care | Payer: Managed Care, Other (non HMO) | Attending: Emergency Medicine | Admitting: Emergency Medicine

## 2016-02-10 DIAGNOSIS — O99332 Smoking (tobacco) complicating pregnancy, second trimester: Secondary | ICD-10-CM | POA: Diagnosis not present

## 2016-02-10 DIAGNOSIS — F17210 Nicotine dependence, cigarettes, uncomplicated: Secondary | ICD-10-CM | POA: Diagnosis not present

## 2016-02-10 DIAGNOSIS — Z3A17 17 weeks gestation of pregnancy: Secondary | ICD-10-CM | POA: Diagnosis not present

## 2016-02-10 DIAGNOSIS — O9989 Other specified diseases and conditions complicating pregnancy, childbirth and the puerperium: Secondary | ICD-10-CM | POA: Insufficient documentation

## 2016-02-10 DIAGNOSIS — R0782 Intercostal pain: Secondary | ICD-10-CM | POA: Insufficient documentation

## 2016-02-10 LAB — CBC
HCT: 34.6 % — ABNORMAL LOW (ref 36.0–46.0)
Hemoglobin: 12.3 g/dL (ref 12.0–15.0)
MCH: 30.4 pg (ref 26.0–34.0)
MCHC: 35.5 g/dL (ref 30.0–36.0)
MCV: 85.6 fL (ref 78.0–100.0)
Platelets: 260 10*3/uL (ref 150–400)
RBC: 4.04 MIL/uL (ref 3.87–5.11)
RDW: 12.4 % (ref 11.5–15.5)
WBC: 11 10*3/uL — ABNORMAL HIGH (ref 4.0–10.5)

## 2016-02-10 LAB — BASIC METABOLIC PANEL
Anion gap: 5 (ref 5–15)
BUN: 6 mg/dL (ref 6–20)
CO2: 25 mmol/L (ref 22–32)
Calcium: 9.2 mg/dL (ref 8.9–10.3)
Chloride: 107 mmol/L (ref 101–111)
Creatinine, Ser: 0.56 mg/dL (ref 0.44–1.00)
GFR calc Af Amer: >60 mL/min (ref >60)
GFR calc non Af Amer: >60 mL/min (ref >60)
Glucose, Bld: 83 mg/dL (ref 65–99)
Potassium: 3.7 mmol/L (ref 3.5–5.1)
Sodium: 137 mmol/L (ref 135–145)

## 2016-02-10 LAB — I-STAT TROPONIN, ED: Troponin i, poc: 0 ng/mL (ref 0.00–0.08)

## 2016-02-10 NOTE — ED Provider Notes (Signed)
[facility] DEPT Provider Note   CSN: [id number] Arrival date & time: 02/10/16  1217     History   Chief Complaint Chief Complaint  Patient presents with  . Chest Pain    HPI Anita Potter is a 30 y.o. female.  The history is provided by the patient.  Chest Pain   This is a new problem. The current episode started more than 2 days ago. The problem has not changed since onset.The pain is associated with lifting and raising an arm. Pain location: diffuse anterior chest. The pain is at a severity of 6/10. The pain is mild. The quality of the pain is described as dull. The pain does not radiate. The symptoms are aggravated by certain positions. Associated symptoms include headaches (occasional, not new) and nausea (all of pregnancy). Pertinent negatives include no abdominal pain, no back pain, no cough, no diaphoresis, no exertional chest pressure, no fever, no orthopnea, no palpitations, no shortness of breath and no vomiting. Treatments tried: tylenol. The treatment provided mild relief.  Pertinent negatives for past medical history include no seizures.    History reviewed. No pertinent past medical history.  There are no active problems to display for this patient.   History reviewed. No pertinent surgical history.  OB History    Gravida Para Term Preterm AB Living   1             SAB TAB Ectopic Multiple Live Births                   Home Medications    Prior to Admission medications   Medication Sig Start Date End Date Taking? Authorizing Provider  HYDROcodone-acetaminophen (NORCO/VICODIN) 5-325 MG tablet Take 0.5 tablets by mouth every 6 (six) hours as needed for moderate pain or severe pain.    Historical Provider, MD  ibuprofen (ADVIL,MOTRIN) 600 MG tablet Take 1 tablet (600 mg total) by mouth every 8 (eight) hours as needed. 01/30/15   Jola Schmidt, MD  metroNIDAZOLE (FLAGYL) 500 MG tablet Take 1 tablet (500 mg total) by mouth 3 (three) times daily. 02/17/15    Merrily Pew, MD    Family History History reviewed. No pertinent family history.  Social History Social History  Substance Use Topics  . Smoking status: Current Some Day Smoker    Packs/day: 0.50    Types: Cigarettes  . Smokeless tobacco: Never Used  . Alcohol use Yes     Comment: occassionally     Allergies   Patient has no known allergies.   Review of Systems Review of Systems  Constitutional: Negative for chills, diaphoresis and fever.  HENT: Negative for ear pain and sore throat.   Eyes: Negative for pain and visual disturbance.  Respiratory: Negative for cough and shortness of breath.   Cardiovascular: Positive for chest pain. Negative for palpitations and orthopnea.  Gastrointestinal: Positive for nausea (all of pregnancy). Negative for abdominal pain and vomiting.  Genitourinary: Negative for dysuria and hematuria.  Musculoskeletal: Negative for arthralgias and back pain.  Skin: Negative for color change and rash.  Neurological: Positive for headaches (occasional, not new). Negative for seizures and syncope.  All other systems reviewed and are negative.    Physical Exam Updated Vital Signs BP 131/68   Pulse 84   Temp 98.5 F (36.9 C) (Oral)   Resp 16   LMP 10/09/2015 (Approximate)   SpO2 99%   Physical Exam  Constitutional: She appears well-developed and well-nourished. No distress.  HENT:  Head: Normocephalic and  atraumatic.  Eyes: Conjunctivae are normal.  Neck: Neck supple.  Cardiovascular: Normal rate and regular rhythm.  Exam reveals no friction rub.   No murmur heard. Pulmonary/Chest: Effort normal and breath sounds normal. No respiratory distress. She has no wheezes. She has no rales. She exhibits tenderness.  Abdominal: Soft. There is no tenderness.  Musculoskeletal: She exhibits no edema.  Neurological: She is alert. No cranial nerve deficit or sensory deficit. She exhibits normal muscle tone. Coordination normal.  Skin: Skin is warm and  dry.  Psychiatric: She has a normal mood and affect.  Nursing note and vitals reviewed.    ED Treatments / Results  Labs (all labs ordered are listed, but only abnormal results are displayed) Labs Reviewed  CBC - Abnormal; Notable for the following:       Result Value   WBC 11.0 (*)    HCT 34.6 (*)    All other components within normal limits  BASIC METABOLIC PANEL  I-STAT TROPOININ, ED    EKG  EKG Interpretation  Date/Time:  Thursday February 10 2016 12:19:54 EST Ventricular Rate:  80 PR Interval:  168 QRS Duration: 76 QT Interval:  346 QTC Calculation: 399 R Axis:   71 Text Interpretation:  Normal sinus rhythm Normal ECG No significant change since last tracing Confirmed by KNOTT MD, DANIEL [id number]) on 02/10/2016 4:06:46 PM       Radiology No results found.  Procedures Procedures (including critical care time)  Medications Ordered in ED Medications - No data to display   Initial Impression / Assessment and Plan / ED Course  I have reviewed the triage vital signs and the nursing notes.  Pertinent labs & imaging results that were available during my care of the patient were reviewed by me and considered in my medical decision making (see chart for details).  Clinical Course     This is a 30 year old female who is 17 weeks and 5 days pregnant of her first pregnancy. She is coming today with diffuse chest pain. She describes it as dull achy pain. Worse with arm movement and worse with palpation and worse with deep breathing. No focal areas of pain. She is not hypoxic nor is she tachycardic. Her EKG shows sinus rhythm with no acute signs of ischemia interval abnormality or arrhythmia. Her only risk factors for pulmonary embolism are her pregnancy. Her lungs are clear to auscultation bilaterally her vital signs are stable. She is afebrile. She has tenderness to palpation over the anterior chest. And she has reproducible pain with arm movement. This pain is not  exertional and she has no shortness of breath. She does have intermittent nausea but that has been the case her entire pregnancy. She also has intermittent headaches however she frequently gets headaches just like this. Her cranial nerve exam is normal no signs of discrimination sensory deficits. No lower extremity swelling. She is not hypertensive. Her vital signs are stable and she is safe for discharge home. Strict return precautions are given related to chest pain and pregnancy. Of note CBC 8 troponin and BMP were drawn at triage and there are unremarkable. Patient is counseled to continue good OB care and follow-up.  Final Clinical Impressions(s) / ED Diagnoses   Final diagnoses:  Intercostal pain    New Prescriptions New Prescriptions   No medications on file     Dewaine Conger, MD 02/10/16 [place], MD 02/11/16 [phone]

## 2016-02-10 NOTE — ED Triage Notes (Signed)
Pt sts generalized CP worse with movement and positioning; pt sts currently [redacted] weeks pregnant with prenatal care

## 2016-02-10 NOTE — ED Notes (Signed)
Pt stable, ambulatory, states understanding of discharge instructions 

## 2016-02-18 ENCOUNTER — Encounter: Payer: Self-pay | Admitting: Physician Assistant

## 2016-02-18 ENCOUNTER — Ambulatory Visit: Payer: 59 | Attending: Internal Medicine | Admitting: Physician Assistant

## 2016-02-18 VITALS — BP 122/81 | HR 90 | Temp 98.4°F | Resp 14 | Wt 229.8 lb

## 2016-02-18 DIAGNOSIS — Z3A18 18 weeks gestation of pregnancy: Secondary | ICD-10-CM | POA: Insufficient documentation

## 2016-02-18 DIAGNOSIS — Z3492 Encounter for supervision of normal pregnancy, unspecified, second trimester: Secondary | ICD-10-CM | POA: Insufficient documentation

## 2016-02-18 DIAGNOSIS — T148XXA Other injury of unspecified body region, initial encounter: Secondary | ICD-10-CM | POA: Diagnosis not present

## 2016-02-18 NOTE — Progress Notes (Signed)
Anita Potter, is a 29 y.o. female  [id number]  [id number]  DOB - September 02, 1985  Subjective:  Chief Complaint and HPI: Anita Potter is a 30 y.o. female here today to establish care and for a follow up visit after being seen in the ED for CP on 02/10/2016.  Cardiac causes R/O.  No evidence of PE.  She has not had any CP since the ED visit.  She needs a note to go back to full duty at work.  She is [redacted] weeks pregnant with an uneventful pregnancy.  She does not have any health problems.  No CP, SOB, leg swelling.  She feels great.  Pregnant with a baby boy.  ED/Hospital notes reviewed.  BMP, CBC, troponin all normal.   ROS:   Constitutional:  No f/c, No night sweats, No unexplained weight loss. EENT:  No vision changes, No blurry vision, No hearing changes. No mouth, throat, or ear problems.  Respiratory: No cough, No SOB Cardiac: No CP, no palpitations GI:  No abd pain, No N/V/D. GU: No Urinary s/sx Musculoskeletal: No joint pain Neuro: No headache, no dizziness, no motor weakness.  Skin: No rash Endocrine:  No polydipsia. No polyuria.  Psych: Denies SI/HI  No problems updated.  ALLERGIES: No Known Allergies  PAST MEDICAL HISTORY: No past medical history on file.  MEDICATIONS AT HOME: Prior to Admission medications   Medication Sig Start Date End Date Taking? Authorizing Provider  acetaminophen (TYLENOL) 500 MG tablet Take 500 mg by mouth every 6 (six) hours as needed for mild pain.   Yes Historical Provider, MD  ibuprofen (ADVIL,MOTRIN) 600 MG tablet Take 1 tablet (600 mg total) by mouth every 8 (eight) hours as needed. Patient not taking: Reported on 02/18/2016 01/30/15   Jola Schmidt, MD  metroNIDAZOLE (FLAGYL) 500 MG tablet Take 1 tablet (500 mg total) by mouth 3 (three) times daily. Patient not taking: Reported on 02/18/2016 02/17/15   Merrily Pew, MD     Objective:  EXAM:   Vitals:   02/18/16 1435  BP: 122/81  Pulse: 90  Resp: 14  Temp: 98.4 F  (36.9 C)  TempSrc: Oral  SpO2: 98%  Weight: 229 lb 12.8 oz (104.2 kg)    General appearance : A&OX3. NAD. Non-toxic-appearing HEENT: Atraumatic and Normocephalic.  PERRLA. EOM intact.   Neck: supple, no JVD. No cervical lymphadenopathy. No thyromegaly Chest/Lungs:  Breathing-non-labored, Good air entry bilaterally, breath sounds normal without rales, rhonchi, or wheezing  CVS: S1 S2 regular, no murmurs, gallops, rubs  Extremities: Bilateral Lower Ext shows no edema, both legs are warm to touch with = pulse throughout Neurology:  CN II-XII grossly intact, Non focal.   Psych:  TP linear. J/I WNL. Normal speech. Appropriate eye contact and affect.  Skin:  No Rash  Data Review No results found for: HGBA1C   Assessment & Plan   1. Muscle strain CP is completely resolved.  Pregnancy going well and being followed by [facility].   It is fine for her to go back to regular duty at work at this time.  Patient have been counseled extensively about nutrition and exercise  Return if symptoms worsen or fail to improve, for to establish care.  The patient was given clear instructions to go to ER or return to medical center if symptoms don't improve, worsen or new problems develop. The patient verbalized understanding. The patient was told to call to get lab results if they haven't heard anything in the next week.  Freeman Caldron, PA-C [facility] [place], [place]   02/18/2016, 2:46 PMPatient ID: Anita Potter, female   DOB: 1985-11-02, 30 y.o.   MRN: [id number]

## 2016-02-18 NOTE — Progress Notes (Signed)
Pt is in the office today for chest pain follow so restrictions can be cleared at work Pt is currently pregnant(18wks)

## 2016-02-24 ENCOUNTER — Inpatient Hospital Stay (HOSPITAL_COMMUNITY)
Admission: AD | Admit: 2016-02-24 | Discharge: 2016-02-24 | Disposition: A | Discharge status: Home / Self Care | Payer: Managed Care, Other (non HMO) | Source: Ambulatory Visit | Attending: Obstetrics and Gynecology | Admitting: Obstetrics and Gynecology

## 2016-02-24 ENCOUNTER — Encounter (HOSPITAL_COMMUNITY): Payer: Self-pay | Admitting: *Deleted

## 2016-02-24 DIAGNOSIS — O26892 Other specified pregnancy related conditions, second trimester: Secondary | ICD-10-CM | POA: Diagnosis not present

## 2016-02-24 DIAGNOSIS — Z3A19 19 weeks gestation of pregnancy: Secondary | ICD-10-CM | POA: Insufficient documentation

## 2016-02-24 DIAGNOSIS — Z87891 Personal history of nicotine dependence: Secondary | ICD-10-CM | POA: Insufficient documentation

## 2016-02-24 DIAGNOSIS — R109 Unspecified abdominal pain: Secondary | ICD-10-CM | POA: Diagnosis not present

## 2016-02-24 LAB — URINALYSIS, ROUTINE W REFLEX MICROSCOPIC
Bilirubin Urine: NEGATIVE
Glucose, UA: NEGATIVE mg/dL
Hgb urine dipstick: NEGATIVE
Ketones, ur: NEGATIVE mg/dL
Leukocytes, UA: NEGATIVE
Nitrite: NEGATIVE
Protein, ur: NEGATIVE mg/dL
Specific Gravity, Urine: 1.019 (ref 1.005–1.030)
pH: 7 (ref 5.0–8.0)

## 2016-02-24 LAB — WET PREP, GENITAL
Clue Cells Wet Prep HPF POC: NONE SEEN
Sperm: NONE SEEN
Trich, Wet Prep: NONE SEEN
Yeast Wet Prep HPF POC: NONE SEEN

## 2016-02-24 LAB — OB RESULTS CONSOLE GC/CHLAMYDIA: Gonorrhea: NEGATIVE

## 2016-02-24 NOTE — MAU Note (Signed)
Pt reports she has been having lower abd pain/cramping off and on for a week. Also has had an upper respiratory /cold that she has been treating with robitussin. Went to urgent care today and they told hr she had a small amount of blood in her urine and it might be a UTI but they were not sure.

## 2016-02-24 NOTE — MAU Provider Note (Signed)
Chief Complaint: Abdominal Cramping   First Provider Initiated Contact with Patient 02/24/16 1812     SUBJECTIVE HPI: Anita Potter is a 30 y.o. G1P0 at [redacted]w[redacted]d who presents to Maternity Admissions reporting mild low abdominal cramping 1 week and was told at urgent care that she has blood in her urine might have UTI. Recommend that she follow-up with her OB. Denies urinary complaints.  Location: Suprapubic Quality: Cramping Severity: Mild Duration: One week Context: None Timing: Intermittent Modifying factors: None. Hasn't tried anything for the pain. Associated signs and symptoms: Negative for fever, chills, vaginal bleeding, vaginal discharge, urinary complaints or GI complaints.  History reviewed. No pertinent past medical history. OB History  Gravida Para Term Preterm AB Living  1            SAB TAB Ectopic Multiple Live Births               # Outcome Date GA Lbr Len/2nd Weight Sex Delivery Anes PTL Lv  1 Current              History reviewed. No pertinent surgical history. Social History   Social History  . Marital status: Single    Spouse name: N/A  . Number of children: N/A  . Years of education: N/A   Occupational History  . Not on file.   Social History Main Topics  . Smoking status: Former Smoker    Packs/day: 0.50    Types: Cigarettes    Quit date: 04/27/2015  . Smokeless tobacco: Never Used  . Alcohol use Yes     Comment: occassionally  . Drug use: No  . Sexual activity: Yes   Other Topics Concern  . Not on file   Social History Narrative  . No narrative on file   History reviewed. No pertinent family history. No current facility-administered medications on file prior to encounter.    Current Outpatient Prescriptions on File Prior to Encounter  Medication Sig Dispense Refill  . ibuprofen (ADVIL,MOTRIN) 600 MG tablet Take 1 tablet (600 mg total) by mouth every 8 (eight) hours as needed. (Patient not taking: Reported on 02/24/2016) 15 tablet 0  .  metroNIDAZOLE (FLAGYL) 500 MG tablet Take 1 tablet (500 mg total) by mouth 3 (three) times daily. (Patient not taking: Reported on 02/24/2016) 21 tablet 0   No Known Allergies  I have reviewed patient's Past Medical Hx, Surgical Hx, Family Hx, Social Hx, medications and allergies.   Review of Systems  Constitutional: Negative for chills and fever.  Gastrointestinal: Positive for abdominal pain. Negative for abdominal distention, constipation, diarrhea, nausea and vomiting.  Genitourinary: Negative for dysuria, flank pain, frequency, hematuria, urgency, vaginal bleeding and vaginal discharge.  Musculoskeletal: Negative for back pain.    OBJECTIVE Patient Vitals for the past 24 hrs:  BP Temp Temp src Pulse Resp Height Weight  02/24/16 1735 131/70 98.4 F (36.9 C) Oral 76 18 5\' 5"  (1.651 m) 231 lb (104.8 kg)   Constitutional: Well-developed, well-nourished female in no acute distress.  Cardiovascular: normal rate Respiratory: normal rate and effort.  GI: Abd soft, non-tender, gravid appropriate for gestational age. Pos BS x 4 MS: Extremities nontender, no edema, normal ROM Neurologic: Alert and oriented x 4.  GU: Neg CVAT.  SPECULUM EXAM: NEFG, physiologic discharge, no blood noted, cervix clean  BIMANUAL: cervix Long and closed; uterus 20-week size no adnexal tenderness or masses. No CMT.  FHR 145 by doppler  LAB RESULTS Results for orders placed or performed during the hospital  encounter of 02/24/16 (from the past 24 hour(s))  Urinalysis, Routine w reflex microscopic     Status: Abnormal   Collection Time: 02/24/16  5:40 PM  Result Value Ref Range   Color, Urine YELLOW YELLOW   APPearance HAZY (A) CLEAR   Specific Gravity, Urine 1.019 1.005 - 1.030   pH 7.0 5.0 - 8.0   Glucose, UA NEGATIVE NEGATIVE mg/dL   Hgb urine dipstick NEGATIVE NEGATIVE   Bilirubin Urine NEGATIVE NEGATIVE   Ketones, ur NEGATIVE NEGATIVE mg/dL   Protein, ur NEGATIVE NEGATIVE mg/dL   Nitrite NEGATIVE  NEGATIVE   Leukocytes, UA NEGATIVE NEGATIVE  Wet prep, genital     Status: Abnormal   Collection Time: 02/24/16  6:17 PM  Result Value Ref Range   Yeast Wet Prep HPF POC NONE SEEN NONE SEEN   Trich, Wet Prep NONE SEEN NONE SEEN   Clue Cells Wet Prep HPF POC NONE SEEN NONE SEEN   WBC, Wet Prep HPF POC FEW (A) NONE SEEN   Sperm NONE SEEN     IMAGING No results found.  MAU COURSE Orders Placed This Encounter  Procedures  . Wet prep, genital  . Culture, OB Urine  . Urinalysis, Routine w reflex microscopic   Meds ordered this encounter  Medications  . Prenatal Vit-Fe Fumarate-FA (PRENATAL MULTIVITAMIN) TABS tablet    Sig: Take 1 tablet by mouth daily at 12 noon.  Marland Kitchen guaiFENesin-dextromethorphan (ROBITUSSIN DM) 100-10 MG/5ML syrup    Sig: Take 5 mLs by mouth every 4 (four) hours as needed for cough.   Discussed history, exam, labs with Dr. Melba Coon. Agrees with plan of care. No new orders.  MDM - Low abdominal pain in pregnancy likely normal discomfort pregnancy, round ligament pain. No evidence of active preterm labor.  ASSESSMENT 1. Abdominal pain during pregnancy in second trimester     PLAN Discharge home in stable condition. Preterm labor precautions Follow-up Information    [facility] Follow up on 02/29/2016.   Why:  as scheduled for prenatal visit or sooner as needed if symptoms worsen.  Contact information: [address]  [address] [place] [address] [phone]        [facility] Follow up.   Why:  as needed in emergencies Contact information: [id number] [place] [phone]           Medication List    STOP taking these medications   ibuprofen 600 MG tablet Commonly known as:  ADVIL,MOTRIN   metroNIDAZOLE 500 MG tablet Commonly known as:  FLAGYL     TAKE these medications   guaiFENesin-dextromethorphan 100-10 MG/5ML  syrup Commonly known as:  ROBITUSSIN DM Take 5 mLs by mouth every 4 (four) hours as needed for cough.   prenatal multivitamin Tabs tablet Take 1 tablet by mouth daily at 12 noon.     ASK your doctor about these medications   acetaminophen 500 MG tablet Commonly known as:  TYLENOL Take 500 mg by mouth every 6 (six) hours as needed for mild pain.        [place], [place] 02/24/2016  6:38 PM

## 2016-02-24 NOTE — Discharge Instructions (Signed)
Abdominal Pain During Pregnancy  Abdominal pain is common in pregnancy. Most of the time, it does not cause harm. There are many causes of abdominal pain. Some causes are more serious than others and sometimes the cause is not known. Abdominal pain can be a sign that something is very wrong with the pregnancy or the pain may have nothing to do with the pregnancy. Always tell your health care provider if you have any abdominal pain.  Follow these instructions at home:  · Do not have sex or put anything in your vagina until your symptoms go away completely.  · Watch your abdominal pain for any changes.  · Get plenty of rest until your pain improves.  · Drink enough fluid to keep your urine clear or pale yellow.  · Take over-the-counter or prescription medicines only as told by your health care provider.  · Keep all follow-up visits as told by your health care provider. This is important.  Contact a health care provider if:  · You have a fever.  · Your pain gets worse or you have cramping.  · Your pain continues after resting.  Get help right away if:  · You are bleeding, leaking fluid, or passing tissue from the vagina.  · You have vomiting or diarrhea that does not go away.  · You have painful or bloody urination.  · You notice a decrease in your baby's movements.  · You feel very weak or faint.  · You have shortness of breath.  · You develop a severe headache with abdominal pain.  · You have abnormal vaginal discharge with abdominal pain.  This information is not intended to replace advice given to you by your health care provider. Make sure you discuss any questions you have with your health care provider.  Document Released: 03/06/2005 Document Revised: 12/16/2015 Document Reviewed: 10/03/2012  Elsevier Interactive Patient Education © 2017 [employer] Inc[employer]

## 2016-02-25 LAB — GC/CHLAMYDIA PROBE AMP (~~LOC~~) NOT AT ARMC
Chlamydia: NEGATIVE
Neisseria Gonorrhea: NEGATIVE

## 2016-02-26 LAB — CULTURE, OB URINE: Special Requests: NORMAL

## 2016-03-20 DIAGNOSIS — Z8619 Personal history of other infectious and parasitic diseases: Secondary | ICD-10-CM

## 2016-03-20 HISTORY — DX: Personal history of other infectious and parasitic diseases: Z86.19

## 2016-03-20 NOTE — L&D Delivery Note (Signed)
Delivery Note Pt pushed well for 84mins and at 9:31 AM a viable female was delivered via Vaginal, Spontaneous Delivery (Presentation:ROA ;  ).  APGAR: 9, 9; weight pending . AROM performed at perineum prior to delivery - clear fluid. Loose nuchal x 1 reduced during delivery   Placenta status:delivered, intact, duncan , .  Cord:3vc  with the following complications:none .  Cord pH: n/a  Anesthesia:  None Episiotomy: None Lacerations: 2nd degree;Periurethral;Perineal Suture Repair: 2.0 vicryl and 4-0 vicryl Est. Blood Loss (mL): 200  Mom to postpartum.  Baby to Couplet care / Skin to Skin  Discussed circumcision and they would like done in hospital.  [place] 07/04/2016, 10:00 AM

## 2016-03-21 ENCOUNTER — Encounter (HOSPITAL_COMMUNITY): Payer: Self-pay | Admitting: Emergency Medicine

## 2016-03-21 ENCOUNTER — Ambulatory Visit (HOSPITAL_COMMUNITY)
Admission: EM | Admit: 2016-03-21 | Discharge: 2016-03-21 | Disposition: A | Discharge status: Home / Self Care | Payer: Managed Care, Other (non HMO) | Attending: Family Medicine | Admitting: Family Medicine

## 2016-03-21 DIAGNOSIS — A749 Chlamydial infection, unspecified: Secondary | ICD-10-CM

## 2016-03-21 DIAGNOSIS — Z202 Contact with and (suspected) exposure to infections with a predominantly sexual mode of transmission: Secondary | ICD-10-CM

## 2016-03-21 MED ORDER — AZITHROMYCIN 250 MG PO TABS
1000.0000 mg | ORAL_TABLET | Freq: Once | ORAL | Status: AC
  Administered 2016-03-21: 1000 mg via ORAL

## 2016-03-21 MED ORDER — AZITHROMYCIN 250 MG PO TABS
ORAL_TABLET | ORAL | Status: AC
  Filled 2016-03-21: qty 4

## 2016-03-21 NOTE — ED Triage Notes (Signed)
The patient presented to the [facility] with a positive STD test from [facility].

## 2016-03-21 NOTE — ED Provider Notes (Signed)
CSN: [id number]     Arrival date & time 03/21/16  1921 History   None    Chief Complaint  Patient presents with  . Exposure to STD   (Consider location/radiation/quality/duration/timing/severity/associated sxs/prior Treatment) Patient received a notice from [facility] that she had positive vaginal cx for chlamydia and was negative for gc and trich.  She did not receive tx for chlamydia.   The history is provided by the patient.  Exposure to STD  This is a new problem. The current episode started yesterday. The problem occurs constantly. The problem has not changed since onset.Nothing aggravates the symptoms.    History reviewed. No pertinent past medical history. History reviewed. No pertinent surgical history. History reviewed. No pertinent family history. Social History  Substance Use Topics  . Smoking status: Former Smoker    Packs/day: 0.50    Types: Cigarettes    Quit date: 04/27/2015  . Smokeless tobacco: Never Used  . Alcohol use Yes     Comment: occassionally   OB History    Gravida Para Term Preterm AB Living   1             SAB TAB Ectopic Multiple Live Births                 Review of Systems  Constitutional: Negative.   HENT: Negative.   Eyes: Negative.   Respiratory: Negative.   Cardiovascular: Negative.   Endocrine: Negative.   Genitourinary: Positive for vaginal discharge.  Musculoskeletal: Negative.   Allergic/Immunologic: Negative.   Neurological: Negative.   Hematological: Negative.   Psychiatric/Behavioral: Negative.     Allergies  Patient has no known allergies.  Home Medications   Prior to Admission medications   Medication Sig Start Date End Date Taking? Authorizing Provider  Prenatal Vit-Fe Fumarate-FA (PRENATAL MULTIVITAMIN) TABS tablet Take 1 tablet by mouth daily at 12 noon.   Yes Historical Provider, MD   Meds Ordered and Administered this Visit   Medications  azithromycin (ZITHROMAX) tablet 1,000 mg (1,000 mg Oral Given 03/21/16  2004)    BP 131/80 (BP Location: Left Arm)   Pulse 79   Temp 98.1 F (36.7 C) (Oral)   Resp 16   LMP 10/09/2015 (Approximate)   SpO2 100%  No data found.   Physical Exam  Constitutional: She appears well-developed and well-nourished.  HENT:  Head: Normocephalic and atraumatic.  Right Ear: External ear normal.  Left Ear: External ear normal.  Mouth/Throat: Oropharynx is clear and moist.  Eyes: EOM are normal. Pupils are equal, round, and reactive to light.  Neck: Normal range of motion.  Cardiovascular: Normal rate, regular rhythm and normal heart sounds.   Pulmonary/Chest: Breath sounds normal.  Abdominal: Soft.  Nursing note and vitals reviewed.   Urgent Care Course   Clinical Course     Procedures (including critical care time)  Labs Review Labs Reviewed - No data to display  Imaging Review No results found.   Visual Acuity Review  Right Eye Distance:   Left Eye Distance:   Bilateral Distance:    Right Eye Near:   Left Eye Near:    Bilateral Near:         MDM   1. Chlamydia   2. Exposure to STD    Declines female exam Azithromycin 1gm in clinic      Lysbeth Penner, [place] 03/21/16 2013

## 2016-04-07 ENCOUNTER — Encounter (HOSPITAL_COMMUNITY): Payer: Self-pay | Admitting: *Deleted

## 2016-04-07 ENCOUNTER — Inpatient Hospital Stay (HOSPITAL_COMMUNITY)
Admission: AD | Admit: 2016-04-07 | Discharge: 2016-04-07 | Disposition: A | Discharge status: Home / Self Care | Payer: Managed Care, Other (non HMO) | Source: Ambulatory Visit | Attending: Obstetrics and Gynecology | Admitting: Obstetrics and Gynecology

## 2016-04-07 DIAGNOSIS — N898 Other specified noninflammatory disorders of vagina: Secondary | ICD-10-CM | POA: Diagnosis present

## 2016-04-07 DIAGNOSIS — O26892 Other specified pregnancy related conditions, second trimester: Secondary | ICD-10-CM | POA: Insufficient documentation

## 2016-04-07 DIAGNOSIS — Z87891 Personal history of nicotine dependence: Secondary | ICD-10-CM | POA: Insufficient documentation

## 2016-04-07 DIAGNOSIS — Z3A25 25 weeks gestation of pregnancy: Secondary | ICD-10-CM | POA: Diagnosis not present

## 2016-04-07 HISTORY — DX: Personal history of other infectious and parasitic diseases: Z86.19

## 2016-04-07 HISTORY — DX: Leiomyoma of uterus, unspecified: D25.9

## 2016-04-07 LAB — WET PREP, GENITAL
Clue Cells Wet Prep HPF POC: NONE SEEN
Sperm: NONE SEEN
Trich, Wet Prep: NONE SEEN
Yeast Wet Prep HPF POC: NONE SEEN

## 2016-04-07 LAB — URINALYSIS, ROUTINE W REFLEX MICROSCOPIC
Bilirubin Urine: NEGATIVE
Glucose, UA: NEGATIVE mg/dL
Hgb urine dipstick: NEGATIVE
Ketones, ur: NEGATIVE mg/dL
Leukocytes, UA: NEGATIVE
Nitrite: NEGATIVE
Protein, ur: NEGATIVE mg/dL
Specific Gravity, Urine: 1.015 (ref 1.005–1.030)
pH: 6 (ref 5.0–8.0)

## 2016-04-07 LAB — POCT FERN TEST
POCT Fern Test: NEGATIVE
POCT Fern Test: NEGATIVE

## 2016-04-07 NOTE — MAU Provider Note (Signed)
History     CSN: [id number]  Arrival date and time: 04/07/16 1034   First Provider Initiated Contact with Patient 04/07/16 1124      Chief Complaint  Patient presents with  . Rupture of Membranes   HPI  Anita Potter is a 31 y.o. G1P0 at [redacted]w[redacted]d who presents with leaking of fluid. Reports watery vaginal discharge since Monday. Reports intermittent periods when her underwear has been wet. No gushes. Discharge has been watery & clear. No odor. Denies vaginal bleeding or irritation. Denies abdominal pain. Was treated for chlamydia at this beginning of this month. Reports no intercourse since being treated.  Positive fetal movement.   OB History    Gravida Para Term Preterm AB Living   1             SAB TAB Ectopic Multiple Live Births                  Past Medical History:  Diagnosis Date  . Hx of chlamydia infection 03/2016  . Uterine fibroid     Past Surgical History:  Procedure Laterality Date  . NO PAST SURGERIES      History reviewed. No pertinent family history.  Social History  Substance Use Topics  . Smoking status: Former Smoker    Packs/day: 0.50    Types: Cigarettes    Quit date: 04/27/2015  . Smokeless tobacco: Never Used  . Alcohol use Yes     Comment: occassionally    Allergies: No Known Allergies  Prescriptions Prior to Admission  Medication Sig Dispense Refill Last Dose  . Prenatal Vit-Fe Fumarate-FA (PRENATAL MULTIVITAMIN) TABS tablet Take 1 tablet by mouth daily at 12 noon.   04/06/2016 at Unknown time    Review of Systems  Constitutional: Negative for chills and fever.  Gastrointestinal: Negative.   Genitourinary: Positive for vaginal discharge. Negative for dysuria and vaginal bleeding.   Physical Exam   Blood pressure 129/71, pulse 84, temperature 98.5 F (36.9 C), temperature source Oral, resp. rate 18, weight 241 lb 4 oz (109.4 kg), last menstrual period 10/09/2015.  Physical Exam  Nursing note and vitals reviewed. Constitutional:  She is oriented to person, place, and time. She appears well-developed and well-nourished. No distress.  HENT:  Head: Normocephalic and atraumatic.  Eyes: Conjunctivae are normal. Right eye exhibits no discharge. Left eye exhibits no discharge. No scleral icterus.  Neck: Normal range of motion.  Respiratory: Effort normal. No respiratory distress.  GI: Soft. There is no tenderness.  Genitourinary: No bleeding in the vagina. Vaginal discharge (small amount of thin white discharge; no pooling; cervix visually closed) found.  Neurological: She is alert and oriented to person, place, and time.  Skin: Skin is warm and dry. She is not diaphoretic.  Psychiatric: She has a normal mood and affect. Her behavior is normal. Judgment and thought content normal.   Fetal Tracing:  Baseline: 135 Variability: moderate Accelerations: 10x10 Decelerations: variable  Toco: none MAU Course  Procedures Results for orders placed or performed during the hospital encounter of 04/07/16 (from the past 24 hour(s))  Urinalysis, Routine w reflex microscopic     Status: Abnormal   Collection Time: 04/07/16 11:01 AM  Result Value Ref Range   Color, Urine YELLOW YELLOW   APPearance HAZY (A) CLEAR   Specific Gravity, Urine 1.015 1.005 - 1.030   pH 6.0 5.0 - 8.0   Glucose, UA NEGATIVE NEGATIVE mg/dL   Hgb urine dipstick NEGATIVE NEGATIVE   Bilirubin Urine NEGATIVE NEGATIVE  Ketones, ur NEGATIVE NEGATIVE mg/dL   Protein, ur NEGATIVE NEGATIVE mg/dL   Nitrite NEGATIVE NEGATIVE   Leukocytes, UA NEGATIVE NEGATIVE  Fern Test     Status: None   Collection Time: 04/07/16 11:25 AM  Result Value Ref Range   POCT Fern Test Negative = intact amniotic membranes   Wet prep, genital     Status: Abnormal   Collection Time: 04/07/16 11:30 AM  Result Value Ref Range   Yeast Wet Prep HPF POC NONE SEEN NONE SEEN   Trich, Wet Prep NONE SEEN NONE SEEN   Clue Cells Wet Prep HPF POC NONE SEEN NONE SEEN   WBC, Wet Prep HPF POC  FEW (A) NONE SEEN   Sperm NONE SEEN   Fern Test     Status: None   Collection Time: 04/07/16 12:01 PM  Result Value Ref Range   POCT Fern Test Negative = intact amniotic membranes     MDM Category 1 tracing No pooling, fern negative Wet prep negative -- too soon for GC/CT today S/w Dr. Melba Coon. Ok to discharge home Assessment and Plan  A: 1. Vaginal discharge during pregnancy in second trimester    P; Discharge home Preterm labor precautions Keep f/u with OB -- discuss chlamydia TOC at that visit  Jorje Guild 04/07/2016, 11:24 AM

## 2016-04-07 NOTE — MAU Note (Signed)
Has been having a few leaks this week.  Has been pretty juicy down there.  Noted underwear has been really wet.  Also having some mucous d/c.

## 2016-04-07 NOTE — Discharge Instructions (Signed)
. °Preterm Labor and Birth Information °The normal length of a pregnancy is 39-41 weeks. Preterm labor is when labor starts before 37 completed weeks of pregnancy. °What are the risk factors for preterm labor? °Preterm labor is more likely to occur in women who: °· Have certain infections during pregnancy such as a bladder infection, sexually transmitted infection, or infection inside the uterus (chorioamnionitis). °· Have a shorter-than-normal cervix. °· Have gone into preterm labor before. °· Have had surgery on their cervix. °· Are younger than age 17 or older than age 35. °· Are African American. °· Are pregnant with twins or multiple babies (multiple gestation). °· Take street drugs or smoke while pregnant. °· Do not gain enough weight while pregnant. °· Became pregnant shortly after having been pregnant. °What are the symptoms of preterm labor? °Symptoms of preterm labor include: °· Cramps similar to those that can happen during a menstrual period. The cramps may happen with diarrhea. °· Pain in the abdomen or lower back. °· Regular uterine contractions that may feel like tightening of the abdomen. °· A feeling of increased pressure in the pelvis. °· Increased watery or bloody mucus discharge from the vagina. °· Water breaking (ruptured amniotic sac). °Why is it important to recognize signs of preterm labor? °It is important to recognize signs of preterm labor because babies who are born prematurely may not be fully developed. This can put them at an increased risk for: °· Long-term (chronic) heart and lung problems. °· Difficulty immediately after birth with regulating body systems, including blood sugar, body temperature, heart rate, and breathing rate. °· Bleeding in the brain. °· Cerebral palsy. °· Learning difficulties. °· Death. °These risks are highest for babies who are born before 34 weeks of pregnancy. °How is preterm labor treated? °Treatment depends on the length of your pregnancy, your condition,  and the health of your baby. It may involve: °· Having a stitch (suture) placed in your cervix to prevent your cervix from opening too early (cerclage). °· Taking or being given medicines, such as: °¨ Hormone medicines. These may be given early in pregnancy to help support the pregnancy. °¨ Medicine to stop contractions. °¨ Medicines to help mature the baby’s lungs. These may be prescribed if the risk of delivery is high. °¨ Medicines to prevent your baby from developing cerebral palsy. °If the labor happens before 34 weeks of pregnancy, you may need to stay in the hospital. °What should I do if I think I am in preterm labor? °If you think that you are going into preterm labor, call your health care provider right away. °How can I prevent preterm labor in future pregnancies? °To increase your chance of having a full-term pregnancy: °· Do not use any tobacco products, such as cigarettes, chewing tobacco, and e-cigarettes. If you need help quitting, ask your health care provider. °· Do not use street drugs or medicines that have not been prescribed to you during your pregnancy. °· Talk with your health care provider before taking any herbal supplements, even if you have been taking them regularly. °· Make sure you gain a healthy amount of weight during your pregnancy. °· Watch for infection. If you think that you might have an infection, get it checked right away. °· Make sure to tell your health care provider if you have gone into preterm labor before. °This information is not intended to replace advice given to you by your health care provider. Make sure you discuss any questions you have with your   health care provider. °Document Released: 05/27/2003 Document Revised: 08/17/2015 Document Reviewed: 07/28/2015 °Elsevier Interactive Patient Education © 2017 Elsevier Inc. ° °

## 2016-04-19 DIAGNOSIS — D259 Leiomyoma of uterus, unspecified: Secondary | ICD-10-CM | POA: Insufficient documentation

## 2016-04-26 ENCOUNTER — Encounter: Payer: Self-pay | Admitting: Family Medicine

## 2016-04-26 ENCOUNTER — Other Ambulatory Visit (HOSPITAL_COMMUNITY)
Admission: RE | Admit: 2016-04-26 | Discharge: 2016-04-26 | Disposition: A | Discharge status: Home / Self Care | Payer: Managed Care, Other (non HMO) | Source: Ambulatory Visit | Attending: Family Medicine | Admitting: Family Medicine

## 2016-04-26 ENCOUNTER — Ambulatory Visit: Payer: Managed Care, Other (non HMO) | Attending: Family Medicine | Admitting: Family Medicine

## 2016-04-26 VITALS — BP 122/76 | HR 98 | Temp 98.1°F | Resp 18 | Ht 65.0 in | Wt 245.4 lb

## 2016-04-26 DIAGNOSIS — Z79899 Other long term (current) drug therapy: Secondary | ICD-10-CM | POA: Diagnosis not present

## 2016-04-26 DIAGNOSIS — Z3A28 28 weeks gestation of pregnancy: Secondary | ICD-10-CM | POA: Insufficient documentation

## 2016-04-26 DIAGNOSIS — Z113 Encounter for screening for infections with a predominantly sexual mode of transmission: Secondary | ICD-10-CM

## 2016-04-26 DIAGNOSIS — Z202 Contact with and (suspected) exposure to infections with a predominantly sexual mode of transmission: Secondary | ICD-10-CM | POA: Diagnosis not present

## 2016-04-26 NOTE — Progress Notes (Signed)
Patient is here for STD screening  Patient has eaten today  Patient has not taking any meds today  Patient declined pain on today visit

## 2016-04-26 NOTE — Progress Notes (Signed)
   Subjective:  Patient ID: Anita Potter, female    DOB: Apr 18, 1985  Age: 31 y.o. MRN: [id number]  CC: Establish Care   HPI Anita Potter presents for sexually transmitted disease check. Contracted chlamydia from sexual partner back in December while pregnant. Reports being told to come back for follow-up. She denies any discharge, dysuria, lesions or pelvic pain. She reports only one sexual partner within the last 3 months. She reports she is not currently sexually active. She reports currently being 28 weeks and 4 days pregnant and is being followed by OB/GYN.   Outpatient Medications Prior to Visit  Medication Sig Dispense Refill  . Prenatal Vit-Fe Fumarate-FA (PRENATAL MULTIVITAMIN) TABS tablet Take 1 tablet by mouth daily at 12 noon.     No facility-administered medications prior to visit.     ROS Review of Systems  Respiratory: Negative.   Cardiovascular: Negative.   Gastrointestinal: Negative.   Genitourinary: Negative.    Objective:  BP 122/76 (BP Location: Left Arm, Patient Position: Sitting, Cuff Size: Normal)   Pulse 98   Temp 98.1 F (36.7 C) (Oral)   Resp 18   Ht 5\' 5"  (1.651 m)   Wt 245 lb 6.4 oz (111.3 kg)   LMP 10/09/2015 (Approximate)   SpO2 98%   BMI 40.84 kg/m   BP/Weight 04/26/2016 [id number] [id number]  Systolic BP 123XX123 [id number]  Diastolic BP 76 71 80  Wt. (Lbs) 245.4 241.25 -  BMI 40.84 40.15 -   Physical Exam  Cardiovascular: Normal rate, regular rhythm, normal heart sounds and intact distal pulses.   Pulmonary/Chest: Effort normal and breath sounds normal.  Abdominal: Soft. Bowel sounds are normal.  Genitourinary:  Genitourinary Comments: Urine cytology screen.  Nursing note and vitals reviewed.    Assessment & Plan:   Problem List Items Addressed This Visit    None    Visit Diagnoses    Screening for STDs (sexually transmitted diseases)    -  Primary   Relevant Orders   STD Panel (HBSAG,HIV,RPR) (Completed)   Urine cytology  ancillary only (Completed)   Hepatitis C Antibody (Completed)   Exposure to sexually transmitted disease (STD)       Relevant Orders   STD Panel (HBSAG,HIV,RPR) (Completed)   Urine cytology ancillary only (Completed)   Hepatitis C Antibody (Completed)       Follow-up: Return As needed.   Alfonse Spruce FNP

## 2016-04-26 NOTE — Patient Instructions (Addendum)
Third Trimester of Pregnancy The third trimester is from week 29 through week 42, months 7 through 9. This trimester is when your unborn baby (fetus) is growing very fast. At the end of the ninth month, the unborn baby is about 20 inches in length. It weighs about 6-10 pounds. Follow these instructions at home:  Avoid all smoking, herbs, and alcohol. Avoid drugs not approved by your doctor.  Do not use any tobacco products, including cigarettes, chewing tobacco, and electronic cigarettes. If you need help quitting, ask your doctor. You may get counseling or other support to help you quit.  Only take medicine as told by your doctor. Some medicines are safe and some are not during pregnancy.  Exercise only as told by your doctor. Stop exercising if you start having cramps.  Eat regular, healthy meals.  Wear a good support bra if your breasts are tender.  Do not use hot tubs, steam rooms, or saunas.  Wear your seat belt when driving.  Avoid raw meat, uncooked cheese, and liter boxes and soil used by cats.  Take your prenatal vitamins.  Take 1500-2000 milligrams of calcium daily starting at the 20th week of pregnancy until you deliver your baby.  Try taking medicine that helps you poop (stool softener) as needed, and if your doctor approves. Eat more fiber by eating fresh fruit, vegetables, and whole grains. Drink enough fluids to keep your pee (urine) clear or pale yellow.  Take warm water baths (sitz baths) to soothe pain or discomfort caused by hemorrhoids. Use hemorrhoid cream if your doctor approves.  If you have puffy, bulging veins (varicose veins), wear support hose. Raise (elevate) your feet for 15 minutes, 3-4 times a day. Limit salt in your diet.  Avoid heavy lifting, wear low heels, and sit up straight.  Rest with your legs raised if you have leg cramps or low back pain.  Visit your dentist if you have not gone during your pregnancy. Use a soft toothbrush to brush your  teeth. Be gentle when you floss.  You can have sex (intercourse) unless your doctor tells you not to.  Do not travel far distances unless you must. Only do so with your doctor's approval.  Take prenatal classes.  Practice driving to the hospital.  Pack your hospital bag.  Prepare the baby's room.  Go to your doctor visits. Get help if:  You are not sure if you are in labor or if your water has broken.  You are dizzy.  You have mild cramps or pressure in your lower belly (abdominal).  You have a nagging pain in your belly area.  You continue to feel sick to your stomach (nauseous), throw up (vomit), or have watery poop (diarrhea).  You have bad smelling fluid coming from your vagina.  You have pain with peeing (urination). Get help right away if:  You have a fever.  You are leaking fluid from your vagina.  You are spotting or bleeding from your vagina.  You have severe belly cramping or pain.  You lose or gain weight rapidly.  You have trouble catching your breath and have chest pain.  You notice sudden or extreme puffiness (swelling) of your face, hands, ankles, feet, or legs.  You have not felt the baby move in over an hour.  You have severe headaches that do not go away with medicine.  You have vision changes. This information is not intended to replace advice given to you by your health care provider. Make   sure you discuss any questions you have with your health care provider. Document Released: 05/31/2009 Document Revised: 08/12/2015 Document Reviewed: 05/07/2012 Elsevier Interactive Patient Education  2017 [employer].  Pregnancy and Sexually Transmitted Infections An STI (sexually transmitted infection) is a disease or infection that may be passed (transmitted) from person to person, usually during sexual activity. This may happen by way of saliva, semen, blood, vaginal mucus, or urine. An STI can be caused by bacteria, viruses, or parasites. During  pregnancy, STIs can be dangerous for you and your unborn baby. It is important to take steps to reduce your chances of getting an STI. Also, you need to be seen by your health care provider right away if you think you may have an STI, or if you think you may have been exposed to an STI. Diagnosis and treatment will depend on the type of STI. If you are already pregnant, you will be screened for HIV (human immunodeficiency virus) early in your pregnancy. If you are at high risk for HIV, this test may be repeated during your third trimester of pregnancy. What are some common STIs? There are different types of STIs. Some STIs that cause problems in pregnancy include:  Gonorrhea.  Chlamydia.  Syphilis.  HIV and AIDS (acquired immunodeficiency syndrome).  Genital herpes.  Hepatitis.  Genital warts.  Human papillomavirus (HPV).  Trichomoniasis. STIs that do not affect the baby include:  Chancroid.  Pubic lice. What are the possible effects of STIs during pregnancy? STIs can cause:  Stillbirth.  Miscarriage.  Premature labor.  Premature rupture of the membranes.  Serious birth defects or deformities.  Infection of the amniotic sac.  Infections that occur after birth (postpartum) in you and the baby.  Slowed growth of the baby before birth.  Illnesses in newborns. What are common symptoms of STIs? Different STIs have different symptoms. Some women may not have any symptoms. If symptoms are present, they may include:  Painful or bloody urination.  Pain in the pelvis, abdomen, vagina, anus, throat, or eyes.  A skin rash, itching, or irritation.  Growths, ulcerations, blisters, or sores in the genital and anal areas.  Fever.  Abnormal vaginal discharge, with or without bad odor.  Pain or bleeding during sexual intercourse.  Yellowing of the skin and the white parts of the eyes (jaundice).  Swollen glands in the groin area. Even if symptoms are not present, an  STI can still be passed to another person during sexual contact. How are STIs diagnosed? Your health care provider can use tests to determine if you have an STI. These may include blood tests, urine tests, and tests performed during a pelvic exam. You should be screened for STIs, including gonorrhea and chlamydia, if:  You are sexually active and are younger than age 3.  You are age 65 or older and your health care provider tells you that you are at risk for these types of infections.  Your sexual activity has changed since the last time you were screened, and you are at an increased risk for chlamydia or gonorrhea. Ask your health care provider if you are at risk. How can I reduce my risk of getting an STI? Take these actions to reduce your risk of getting an STI:  Do not have any oral, vaginal, or anal sex. This is known as practicing abstinence.  If you have sex, use a latex condom or a female condom consistently and correctly during sexual intercourse.  Use dental dams and water-soluble lubricants  during sexual activity. Do not use petroleum jelly or oils.  Avoid having multiple sexual partners.  Do not have sex with someone who has other sexual partners.  Do not have sex with anyone you do not know or who is at high risk for an STI.  Avoid risky sex acts that can break the skin.  Do not have sex if you have open sores on your mouth or skin.  Avoid engaging in oral and anal sex acts.  Get the hepatitis vaccine. It is safe for pregnant women. What should I do if I think I have an STI?  See your health care provider.  Tell your sexual partner or partners. They should be tested and treated for any STIs.  Do not have sex until your health care provider says it is okay. Get help right away if: Contact your health care provider right away if:  You have any symptoms of an STI.  You think that you have, or your sexual partner has, an STI even if there are no symptoms.  You  think that you may have been exposed to an STI. This information is not intended to replace advice given to you by your health care provider. Make sure you discuss any questions you have with your health care provider. Document Released: 04/13/2004 Document Revised: 11/04/2015 Document Reviewed: 10/11/2015 Elsevier Interactive Patient Education  2017 Elsevier Inc.   Contraceptive Barrier Methods A barrier method is a type of birth control (contraception) that is used to prevent pregnancy. These methods include:   Female condom.   Female condom.   Diaphragm.   Cervical cap.   Sponge.   Spermicide.  Your health care provider can help you decide what form of contraception is best for you. Always keep in mind the risks of sexually transmitted infections (STIs).  FEMALE CONDOM A female condom is a thin sheath (latex or rubber) that is worn over the penis during sexual intercourse. The condom prevents pregnancy by catching and stopping the sperm from reaching the uterus. Condoms may come with a spermicide on them, and they can only be worn once. Condoms should not be used with petroleum jelly, lotions, or oils. These things decrease their effectiveness. Condoms can be used with water-based lubricants. Condoms help protect against STIs. Latex and polyurethane condoms provide the best available protection against many STIs, including HIV.  FEMALE CONDOM The female condom is a soft, loose-fitting sheath that is put into the vagina before sexual intercourse. It prevents pregnancy by catching the sperm in the condom and blocking the passage of sperm to the uterus. It is intended for one-time use only. A female partner should not use a condom at the same time. The female and female condoms may stick together and break. A female condom can be inserted as long as 8 hours before intercourse. Condoms help protect against STIs.  DIAPHRAGM A diaphragm is a soft, latex, dome-shaped barrier that is placed in the  vagina with spermicidal jelly before sexual intercourse. It covers the cervix, kills sperm, and blocks the passage of semen into the cervix. The diaphragm can be inserted up to 2 hours before sex. If it is inserted more than 2 hours before intercourse, then spermicide must be applied again. The diaphragm should be left in the vagina for 6-8 hours after intercourse. It must be fitted by a health care provider. This method does not protect against STIs.  CERVICAL CAP A cervical cap is a round, soft, latex or plastic cup that  is put in the vagina and fits over the cervix. It may be inserted as long as 6 hours before sexual activity. It must be left in place for at least 6 hours after intercourse and can be left in place for as long as 48 hours. It provides continuous protection as long as it is in place, regardless of the number of intercourse acts. The cervical cap cannot be used during your period. It must be fitted by a health care provider. Cervical caps do not protect against STIs. SPONGE A sponge is a soft, circular piece of polyurethane foam that has spermicide in it. The sponge has a loop for removal. It is inserted into the vagina after wetting it and is placed over the cervix before sexual intercourse. The foam is designed to trap and absorb sperm before it enters the cervix. The spermicide kills or immobilizes sperm. The sponge offers an immediate and continuous presence of spermicide throughout a 24-hour period regardless of the number of intercourse acts. The sponge should be left in place for at least 6 hours after sex. It should not be left in for more than 24 hours, and it cannot be reused. The sponge does not protect against STIs. SPERMICIDES Spermicides are chemicals that kill or block sperm from entering the cervix and uterus. They come in the form of creams, jellies, suppositories, foam, film, or tablets. The film, tablets, and suppositories should be inserted 10 to 30 minutes before sexual  intercourse so they can dissolve. They are inserted into the vagina with an applicator before having sexual intercourse. This must be repeated every time you have sexual intercourse. The use of spermicides does not protect against STIs. This information is not intended to replace advice given to you by your health care provider. Make sure you discuss any questions you have with your health care provider. Document Released: 01/01/2007 Document Revised: 06/28/2015 Document Reviewed: 08/18/2012 Elsevier Interactive Patient Education  2017 [employer].

## 2016-04-27 LAB — STD PANEL
HIV 1&2 Ab, 4th Generation: NONREACTIVE
Hepatitis B Surface Ag: NEGATIVE

## 2016-04-27 LAB — URINE CYTOLOGY ANCILLARY ONLY
Chlamydia: NEGATIVE
Neisseria Gonorrhea: NEGATIVE
Trichomonas: NEGATIVE

## 2016-04-27 LAB — HEPATITIS C ANTIBODY: HCV Ab: NEGATIVE

## 2016-04-28 ENCOUNTER — Other Ambulatory Visit: Payer: Self-pay | Admitting: Family Medicine

## 2016-04-28 ENCOUNTER — Telehealth: Payer: Self-pay

## 2016-04-28 DIAGNOSIS — Z113 Encounter for screening for infections with a predominantly sexual mode of transmission: Secondary | ICD-10-CM

## 2016-04-28 NOTE — Telephone Encounter (Signed)
-----   Message from Alfonse Spruce, [place] sent at 04/28/2016  7:15 AM EST ----- HIV, Hepatitis B and C, and syphilis are all negative. Gonorrhea, Chlamydia,, and Trichomonas were all negative. You may schedule appointment with lab for herpes testing.

## 2016-04-28 NOTE — Telephone Encounter (Signed)
CMA call to inform about lab results  Patient verify DOB  Patient was aware and understood

## 2016-05-01 LAB — URINE CYTOLOGY ANCILLARY ONLY
Bacterial vaginitis: NEGATIVE
Candida vaginitis: NEGATIVE

## 2016-05-02 ENCOUNTER — Telehealth: Payer: Self-pay

## 2016-05-02 NOTE — Telephone Encounter (Signed)
-----   Message from Alfonse Spruce, [place] sent at 05/02/2016  8:31 AM EST ----- -Bacterial vaginosis and yeast were negative.

## 2016-05-02 NOTE — Telephone Encounter (Signed)
CMA call to go over results  Patient Verify DOB  Patient was aware and understood

## 2016-06-07 ENCOUNTER — Inpatient Hospital Stay (HOSPITAL_COMMUNITY)
Admission: AD | Admit: 2016-06-07 | Discharge: 2016-06-07 | Disposition: A | Discharge status: Home / Self Care | Payer: 59 | Source: Ambulatory Visit | Attending: Obstetrics and Gynecology | Admitting: Obstetrics and Gynecology

## 2016-06-07 ENCOUNTER — Encounter (HOSPITAL_COMMUNITY): Payer: Self-pay | Admitting: *Deleted

## 2016-06-07 DIAGNOSIS — R109 Unspecified abdominal pain: Secondary | ICD-10-CM | POA: Diagnosis present

## 2016-06-07 DIAGNOSIS — Z87891 Personal history of nicotine dependence: Secondary | ICD-10-CM | POA: Diagnosis not present

## 2016-06-07 DIAGNOSIS — Z3A34 34 weeks gestation of pregnancy: Secondary | ICD-10-CM | POA: Insufficient documentation

## 2016-06-07 DIAGNOSIS — O26893 Other specified pregnancy related conditions, third trimester: Secondary | ICD-10-CM | POA: Diagnosis not present

## 2016-06-07 DIAGNOSIS — O26899 Other specified pregnancy related conditions, unspecified trimester: Secondary | ICD-10-CM

## 2016-06-07 DIAGNOSIS — R102 Pelvic and perineal pain: Secondary | ICD-10-CM | POA: Diagnosis not present

## 2016-06-07 LAB — URINALYSIS, ROUTINE W REFLEX MICROSCOPIC
Bilirubin Urine: NEGATIVE
Glucose, UA: NEGATIVE mg/dL
Hgb urine dipstick: NEGATIVE
Ketones, ur: NEGATIVE mg/dL
Nitrite: NEGATIVE
Protein, ur: NEGATIVE mg/dL
Specific Gravity, Urine: 1.017 (ref 1.005–1.030)
pH: 6 (ref 5.0–8.0)

## 2016-06-07 NOTE — Progress Notes (Signed)
FHT from earlier this am reviewed.  Reactive NST, no significant decels or ctx.

## 2016-06-07 NOTE — MAU Provider Note (Signed)
History     CSN: [id number]  Arrival date and time: 06/07/16 4081   First Provider Initiated Contact with Patient 06/07/16 0206      Chief Complaint  Patient presents with  . Abdominal Pain   HPI Anita Potter is a 31 y.o. G1P0 at [redacted]w[redacted]d who presents with abdominal pain. Reports intermittent abdominal pain since Sunday. Pain is worse with walking, position changes, & movement. Has treated pain with tylenol & heating pads without relief. Does wear a maternity supports belt at work. Denies contractions, vaginal bleeding, LOF, dysuria, n/v/d, constipation. Positive fetal movement.   OB History    Gravida Para Term Preterm AB Living   1             SAB TAB Ectopic Multiple Live Births                  Past Medical History:  Diagnosis Date  . Hx of chlamydia infection 03/2016  . Uterine fibroid     Past Surgical History:  Procedure Laterality Date  . NO PAST SURGERIES      No family history on file.  Social History  Substance Use Topics  . Smoking status: Former Smoker    Packs/day: 0.50    Types: Cigarettes    Quit date: 04/27/2015  . Smokeless tobacco: Never Used  . Alcohol use Yes     Comment: occassionally    Allergies: No Known Allergies  No prescriptions prior to admission.    Review of Systems  Constitutional: Negative.   Gastrointestinal: Positive for abdominal pain. Negative for constipation, diarrhea, nausea and vomiting.  Genitourinary: Negative.    Physical Exam   Blood pressure 137/82, pulse 77, temperature 98.3 F (36.8 C), temperature source Oral, resp. rate 16, last menstrual period 10/09/2015.  Physical Exam  Nursing note and vitals reviewed. Constitutional: She is oriented to person, place, and time. She appears well-developed and well-nourished. No distress.  HENT:  Head: Normocephalic and atraumatic.  Eyes: Conjunctivae are normal. Right eye exhibits no discharge. Left eye exhibits no discharge. No scleral icterus.  Neck: Normal  range of motion.  Cardiovascular: Normal rate, regular rhythm and normal heart sounds.   No murmur heard. Respiratory: Effort normal and breath sounds normal. No respiratory distress. She has no wheezes.  GI: Soft. Bowel sounds are normal. There is no tenderness.  Neurological: She is alert and oriented to person, place, and time.  Skin: Skin is warm and dry. She is not diaphoretic.  Psychiatric: She has a normal mood and affect. Her behavior is normal. Judgment and thought content normal.   Dilation: Fingertip Effacement (%): Thick Station: Ballotable Exam by:: Robyne Askew NP  Fetal Tracing:  Baseline: 130 Variability: moderate Accelerations: 15x15 Decelerations: none  Toco: 3-8 min, 40-70 sec MAU Course  Procedures Results for orders placed or performed during the hospital encounter of 06/07/16 (from the past 24 hour(s))  Urinalysis, Routine w reflex microscopic     Status: Abnormal   Collection Time: 06/07/16  1:35 AM  Result Value Ref Range   Color, Urine YELLOW YELLOW   APPearance HAZY (A) CLEAR   Specific Gravity, Urine 1.017 1.005 - 1.030   pH 6.0 5.0 - 8.0   Glucose, UA NEGATIVE NEGATIVE mg/dL   Hgb urine dipstick NEGATIVE NEGATIVE   Bilirubin Urine NEGATIVE NEGATIVE   Ketones, ur NEGATIVE NEGATIVE mg/dL   Protein, ur NEGATIVE NEGATIVE mg/dL   Nitrite NEGATIVE NEGATIVE   Leukocytes, UA TRACE (A) NEGATIVE   RBC /  HPF 0-5 0 - 5 RBC/hpf   WBC, UA 0-5 0 - 5 WBC/hpf   Bacteria, UA FEW (A) NONE SEEN   Squamous Epithelial / LPF 0-5 (A) NONE SEEN   Mucous PRESENT     MDM Category 1 tracing Cervix 1/thick/ballotable VSS, NAD S/w Dr. Willis [place]; ok to discharge home  Assessment and Plan  A: 1. Pain of round ligament affecting pregnancy, antepartum    P: Discharge home Continue using maternity support belt Slow position changes Discussed reasons to return to MAU Keep f/u with OB  Jorje Guild 06/07/2016, 2:28 AM

## 2016-06-07 NOTE — MAU Note (Signed)
Pt reports R sided abdominal pain since Sunday that has gotten progressively worse. Pt states that the pain is constantly there, but gets worse when moving, standing, turning over in bed. + FM . Denies bleeding or leaking. Pt has tried heat and Tylenol at home with no relief.

## 2016-06-07 NOTE — MAU Note (Signed)
Pt presents complaining of a sharp shooting pain on the right side of her abdomen that is worse with movement. States she had to leave work to come be seen because it was hurting so bad. Denies leaking or bleeding. Reports good fetal movement. Tried tylenol for the pain with no relief.

## 2016-06-07 NOTE — Discharge Instructions (Signed)
Round Ligament Pain The round ligament is a cord of muscle and tissue that helps to support the uterus. It can become a source of pain during pregnancy if it becomes stretched or twisted as the baby grows. The pain usually begins in the second trimester of pregnancy, and it can come and go until the baby is delivered. It is not a serious problem, and it does not cause harm to the baby. Round ligament pain is usually a short, sharp, and pinching pain, but it can also be a dull, lingering, and aching pain. The pain is felt in the lower side of the abdomen or in the groin. It usually starts deep in the groin and moves up to the outside of the hip area. Pain can occur with:  A sudden change in position.  Rolling over in bed.  Coughing or sneezing.  Physical activity. Follow these instructions at home: Watch your condition for any changes. Take these steps to help with your pain:  When the pain starts, relax. Then try:  Sitting down.  Flexing your knees up to your abdomen.  Lying on your side with one pillow under your abdomen and another pillow between your legs.  Sitting in a warm bath for 15-20 minutes or until the pain goes away.  Take over-the-counter and prescription medicines only as told by your health care provider.  Move slowly when you sit and stand.  Avoid long walks if they cause pain.  Stop or lessen your physical activities if they cause pain. Contact a health care provider if:  Your pain does not go away with treatment.  You feel pain in your back that you did not have before.  Your medicine is not helping. Get help right away if:  You develop a fever or chills.  You develop uterine contractions.  You develop vaginal bleeding.  You develop nausea or vomiting.  You develop diarrhea.  You have pain when you urinate. This information is not intended to replace advice given to you by your health care provider. Make sure you discuss any questions you have with  your health care provider. Document Released: 12/14/2007 Document Revised: 08/12/2015 Document Reviewed: 05/13/2014 Elsevier Interactive Patient Education  2017 [employer].

## 2016-06-15 LAB — OB RESULTS CONSOLE GBS: GBS: POSITIVE

## 2016-07-04 ENCOUNTER — Encounter (HOSPITAL_COMMUNITY): Payer: Self-pay | Admitting: *Deleted

## 2016-07-04 ENCOUNTER — Inpatient Hospital Stay (HOSPITAL_COMMUNITY)
Admission: AD | Admit: 2016-07-04 | Discharge: 2016-07-06 | DRG: 775 | Disposition: A | Discharge status: Home / Self Care | Payer: 59 | Source: Ambulatory Visit | Attending: Obstetrics and Gynecology | Admitting: Obstetrics and Gynecology

## 2016-07-04 DIAGNOSIS — Z87891 Personal history of nicotine dependence: Secondary | ICD-10-CM

## 2016-07-04 DIAGNOSIS — O3413 Maternal care for benign tumor of corpus uteri, third trimester: Secondary | ICD-10-CM | POA: Diagnosis present

## 2016-07-04 DIAGNOSIS — Z3A38 38 weeks gestation of pregnancy: Secondary | ICD-10-CM | POA: Diagnosis not present

## 2016-07-04 DIAGNOSIS — O99824 Streptococcus B carrier state complicating childbirth: Secondary | ICD-10-CM | POA: Diagnosis present

## 2016-07-04 DIAGNOSIS — R109 Unspecified abdominal pain: Secondary | ICD-10-CM

## 2016-07-04 DIAGNOSIS — Z3493 Encounter for supervision of normal pregnancy, unspecified, third trimester: Secondary | ICD-10-CM | POA: Diagnosis present

## 2016-07-04 DIAGNOSIS — D259 Leiomyoma of uterus, unspecified: Secondary | ICD-10-CM | POA: Diagnosis present

## 2016-07-04 DIAGNOSIS — O26892 Other specified pregnancy related conditions, second trimester: Secondary | ICD-10-CM

## 2016-07-04 LAB — PROTEIN / CREATININE RATIO, URINE
Creatinine, Urine: 91 mg/dL
Protein Creatinine Ratio: 0.21 mg/mg{Cre} — ABNORMAL HIGH (ref 0.00–0.15)
Total Protein, Urine: 19 mg/dL

## 2016-07-04 LAB — CBC
HCT: 38.9 % (ref 36.0–46.0)
Hemoglobin: 13.4 g/dL (ref 12.0–15.0)
MCH: 29.5 pg (ref 26.0–34.0)
MCHC: 34.4 g/dL (ref 30.0–36.0)
MCV: 85.7 fL (ref 78.0–100.0)
Platelets: 237 10*3/uL (ref 150–400)
RBC: 4.54 MIL/uL (ref 3.87–5.11)
RDW: 13 % (ref 11.5–15.5)
WBC: 10.3 10*3/uL (ref 4.0–10.5)

## 2016-07-04 LAB — COMPREHENSIVE METABOLIC PANEL
ALT: 17 U/L (ref 14–54)
AST: 33 U/L (ref 15–41)
Albumin: 3.2 g/dL — ABNORMAL LOW (ref 3.5–5.0)
Alkaline Phosphatase: 177 U/L — ABNORMAL HIGH (ref 38–126)
Anion gap: 9 (ref 5–15)
BUN: 8 mg/dL (ref 6–20)
CO2: 21 mmol/L — ABNORMAL LOW (ref 22–32)
Calcium: 8.7 mg/dL — ABNORMAL LOW (ref 8.9–10.3)
Chloride: 105 mmol/L (ref 101–111)
Creatinine, Ser: 0.5 mg/dL (ref 0.44–1.00)
GFR calc Af Amer: >60 mL/min (ref >60)
GFR calc non Af Amer: >60 mL/min (ref >60)
Glucose, Bld: 78 mg/dL (ref 65–99)
Potassium: 4.6 mmol/L (ref 3.5–5.1)
Sodium: 135 mmol/L (ref 135–145)
Total Bilirubin: 1 mg/dL (ref 0.3–1.2)
Total Protein: 6.4 g/dL — ABNORMAL LOW (ref 6.5–8.1)

## 2016-07-04 LAB — TYPE AND SCREEN
ABO/RH(D): A POS
Antibody Screen: NEGATIVE

## 2016-07-04 LAB — RPR: RPR Ser Ql: NONREACTIVE

## 2016-07-04 LAB — ABO/RH: ABO/RH(D): A POS

## 2016-07-04 MED ORDER — ACETAMINOPHEN 325 MG PO TABS: 650.0000 mg | ORAL_TABLET | ORAL | Status: DC | PRN

## 2016-07-04 MED ORDER — SENNOSIDES-DOCUSATE SODIUM 8.6-50 MG PO TABS
2.0000 | ORAL_TABLET | ORAL | Status: DC
Start: 2016-07-05 — End: 2016-07-06
  Administered 2016-07-05 – 2016-07-06 (×2): 2 via ORAL
  Filled 2016-07-04 (×2): qty 2

## 2016-07-04 MED ORDER — OXYCODONE-ACETAMINOPHEN 5-325 MG PO TABS: 2.0000 | ORAL_TABLET | ORAL | Status: DC | PRN

## 2016-07-04 MED ORDER — LABETALOL HCL 5 MG/ML IV SOLN
20.0000 mg | INTRAVENOUS | Status: DC | PRN
Start: 2016-07-04 — End: 2016-07-04
  Administered 2016-07-04: 20 mg via INTRAVENOUS

## 2016-07-04 MED ORDER — ONDANSETRON HCL 4 MG PO TABS: 4.0000 mg | ORAL_TABLET | ORAL | Status: DC | PRN

## 2016-07-04 MED ORDER — LIDOCAINE HCL (PF) 1 % IJ SOLN
30.0000 mL | INTRAMUSCULAR | Status: AC | PRN
  Administered 2016-07-04: 30 mL via SUBCUTANEOUS
  Filled 2016-07-04: qty 30

## 2016-07-04 MED ORDER — BENZOCAINE-MENTHOL 20-0.5 % EX AERO
1.0000 "application " | INHALATION_SPRAY | CUTANEOUS | Status: DC | PRN
  Administered 2016-07-04: 1 via TOPICAL
  Filled 2016-07-04: qty 56

## 2016-07-04 MED ORDER — ACETAMINOPHEN 325 MG PO TABS
650.0000 mg | ORAL_TABLET | ORAL | Status: DC | PRN
  Administered 2016-07-04 (×2): 650 mg via ORAL
  Filled 2016-07-04 (×2): qty 2

## 2016-07-04 MED ORDER — PRENATAL MULTIVITAMIN CH
1.0000 | ORAL_TABLET | Freq: Every day | ORAL | Status: DC
  Filled 2016-07-04: qty 1

## 2016-07-04 MED ORDER — COCONUT OIL OIL: 1.0000 "application " | TOPICAL_OIL | Status: DC | PRN

## 2016-07-04 MED ORDER — OXYTOCIN BOLUS FROM INFUSION
500.0000 mL | Freq: Once | INTRAVENOUS | Status: AC
  Administered 2016-07-04: 500 mL via INTRAVENOUS

## 2016-07-04 MED ORDER — LACTATED RINGERS IV SOLN: 500.0000 mL | INTRAVENOUS | Status: DC | PRN

## 2016-07-04 MED ORDER — ZOLPIDEM TARTRATE 5 MG PO TABS: 5.0000 mg | ORAL_TABLET | Freq: Every evening | ORAL | Status: DC | PRN

## 2016-07-04 MED ORDER — ONDANSETRON HCL 4 MG/2ML IJ SOLN: 4.0000 mg | INTRAMUSCULAR | Status: DC | PRN

## 2016-07-04 MED ORDER — HYDRALAZINE HCL 20 MG/ML IJ SOLN
5.0000 mg | INTRAMUSCULAR | Status: DC | PRN
Start: 2016-07-04 — End: 2016-07-04

## 2016-07-04 MED ORDER — SOD CITRATE-CITRIC ACID 500-334 MG/5ML PO SOLN: 30.0000 mL | ORAL | Status: DC | PRN

## 2016-07-04 MED ORDER — PENICILLIN G POT IN DEXTROSE 60000 UNIT/ML IV SOLN
3.0000 10*6.[IU] | INTRAVENOUS | Status: DC
  Administered 2016-07-04: 3 10*6.[IU] via INTRAVENOUS
  Filled 2016-07-04 (×2): qty 50

## 2016-07-04 MED ORDER — LABETALOL HCL 5 MG/ML IV SOLN
INTRAVENOUS | Status: AC
  Filled 2016-07-04: qty 4

## 2016-07-04 MED ORDER — ONDANSETRON HCL 4 MG/2ML IJ SOLN: 4.0000 mg | Freq: Four times a day (QID) | INTRAMUSCULAR | Status: DC | PRN

## 2016-07-04 MED ORDER — DIBUCAINE 1 % RE OINT
1.0000 "application " | TOPICAL_OINTMENT | RECTAL | Status: DC | PRN
  Administered 2016-07-04: 1 via RECTAL
  Filled 2016-07-04: qty 28

## 2016-07-04 MED ORDER — BUTORPHANOL TARTRATE 1 MG/ML IJ SOLN
1.0000 mg | INTRAMUSCULAR | Status: DC | PRN
  Administered 2016-07-04 (×2): 1 mg via INTRAVENOUS
  Filled 2016-07-04 (×2): qty 1

## 2016-07-04 MED ORDER — OXYTOCIN 40 UNITS IN LACTATED RINGERS INFUSION - SIMPLE MED
INTRAVENOUS | Status: AC
  Filled 2016-07-04: qty 1000

## 2016-07-04 MED ORDER — OXYCODONE-ACETAMINOPHEN 5-325 MG PO TABS: 1.0000 | ORAL_TABLET | ORAL | Status: DC | PRN

## 2016-07-04 MED ORDER — OXYTOCIN 40 UNITS IN LACTATED RINGERS INFUSION - SIMPLE MED
2.5000 [IU]/h | INTRAVENOUS | Status: DC
  Filled 2016-07-04: qty 1000

## 2016-07-04 MED ORDER — IBUPROFEN 600 MG PO TABS
600.0000 mg | ORAL_TABLET | Freq: Four times a day (QID) | ORAL | Status: DC
  Administered 2016-07-04 – 2016-07-06 (×8): 600 mg via ORAL
  Filled 2016-07-04 (×9): qty 1

## 2016-07-04 MED ORDER — TETANUS-DIPHTH-ACELL PERTUSSIS 5-2.5-18.5 LF-MCG/0.5 IM SUSP: 0.5000 mL | Freq: Once | INTRAMUSCULAR | Status: DC

## 2016-07-04 MED ORDER — LACTATED RINGERS IV SOLN
INTRAVENOUS | Status: DC
  Administered 2016-07-04: 04:00:00 via INTRAVENOUS

## 2016-07-04 MED ORDER — WITCH HAZEL-GLYCERIN EX PADS
1.0000 "application " | MEDICATED_PAD | CUTANEOUS | Status: DC | PRN
  Administered 2016-07-04: 1 via TOPICAL

## 2016-07-04 MED ORDER — DIPHENHYDRAMINE HCL 25 MG PO CAPS: 25.0000 mg | ORAL_CAPSULE | Freq: Four times a day (QID) | ORAL | Status: DC | PRN

## 2016-07-04 MED ORDER — SIMETHICONE 80 MG PO CHEW: 80.0000 mg | CHEWABLE_TABLET | ORAL | Status: DC | PRN

## 2016-07-04 MED ORDER — PENICILLIN G POTASSIUM 5000000 UNITS IJ SOLR
5.0000 10*6.[IU] | Freq: Once | INTRAVENOUS | Status: AC
  Administered 2016-07-04: 5 10*6.[IU] via INTRAVENOUS
  Filled 2016-07-04: qty 5

## 2016-07-04 NOTE — Progress Notes (Signed)
At approximately 1121 Pt up to bathroom with nurses assist, pt voided and then began to feel dizzy, was eased to the floor by 2 rns. Pt given juice and lr bolus.  Pt placed on steady and taken back to bed.  bp and bleeding assessed, all wnl! Dr Terri Piedra made aware

## 2016-07-04 NOTE — Lactation Note (Signed)
This note was copied from a baby's chart. Lactation Consultation Note  Patient Name: Anita Potter OFHQR'F Date: 07/04/2016 Reason for consult: Initial assessment  Baby is 43 hours old and has been to the breast several times 8-20 mins.  And mom requested formula earlier and baby received 7 ml of formula.  MBU RN assisted mom with latching at 1420 and baby fed 20 mins.  LC walked in and mom trying to latch the baby.  LC Showed mom how to hand express , several large drops of colostrum  noted and areola compressible. Spoons and colostrum collectors provided for mom.  Baby noted to have a high palate, and short anterior frenulum . LC did not inform mother.  [facility] RN reported to [facility] and [place] also noted when assisting mother with feeding.  Initially until depth achieved mom had some discomfort.  Baby was able to sustain latch with LC for 8 mins , multiple swallows, increased  With breast compressions. Baby released at 8 mins.  Mother informed of post-discharge support and given phone number to the lactation department, including services for phone call assistance; out-patient appointments; and breastfeeding support group. List of other breastfeeding resources in the community given in the handout. Encouraged mother to call for problems or concerns related to breastfeeding.    Maternal Data Has patient been taught Hand Expression?: Yes (several large drops of colostrum ) Does the patient have breastfeeding experience prior to this delivery?: No  Feeding Feeding Type: Breast Fed Nipple Type: Slow - flow Length of feed: 8 min (multiple swallows, increased w/ breast compressions )  LATCH Score/Interventions Latch: Grasps breast easily, tongue down, lips flanged, rhythmical sucking.  Audible Swallowing: Spontaneous and intermittent Intervention(s): Skin to skin;Hand expression Intervention(s): Skin to skin;Hand expression  Type of Nipple: Everted at rest and after stimulation  (compressible areolas )  Comfort (Breast/Nipple): Soft / non-tender     Hold (Positioning): Full assist, staff holds infant at breast Intervention(s): Breastfeeding basics reviewed;Support Pillows;Position options;Skin to skin  LATCH Score: 8  Lactation Tools Discussed/Used WIC Program: No   Consult Status Consult Status: Follow-up Date: 07/05/16 Follow-up type: In-patient    [place] 07/04/2016, 3:04 PM

## 2016-07-04 NOTE — MAU Note (Signed)
PT  SAYS UC   HURT  SINCE  1AM   VE IN OFFICE     2   CM  .       DENIES HSV AND  MRSA.   GBS- POSITIVE.

## 2016-07-04 NOTE — Progress Notes (Signed)
Patient ID: Anita Potter, female   DOB: [birth year], 31 y.o.   MRN: [id number] Pt with discomfort with contractions as well as pressure No pain meds SVE : complete and 0 station TOCO : ctxs q 1-72mins EFM :  120s, cat 1  A/P: Prime at 35 3/7wks copmpletely dilated          Will start pushing         S/P PCN for GBS +         Anticipate svd

## 2016-07-04 NOTE — H&P (Addendum)
Anita Potter is a 31 y.o. female G1P0 at 67 3/7 weeks (EDD 07/15/16 by LMP c/w 9 week [place]) presenting for regular contractions.  Prenatal care complicated by general discomfort and small fibroids.  Also GBS positive.  OB History    Gravida Para Term Preterm AB Living   1             SAB TAB Ectopic Multiple Live Births                 Past Medical History:  Diagnosis Date  . Hx of chlamydia infection 03/2016  . Uterine fibroid    Past Surgical History:  Procedure Laterality Date  . NO PAST SURGERIES     Family History: family history is not on file. Social History:  reports that she quit smoking about 14 months ago. Her smoking use included Cigarettes. She smoked 0.50 packs per day. She has never used smokeless tobacco. She reports that she drinks alcohol. She reports that she does not use drugs.     Maternal Diabetes: No Genetic Screening: Normal Maternal Ultrasounds/Referrals: Normal Fetal Ultrasounds or other Referrals:  None Maternal Substance Abuse:  No Significant Maternal Medications:  None Significant Maternal Lab Results:  Lab values include: Group B Strep positive Other Comments:  None  Review of Systems  Gastrointestinal: Positive for abdominal pain.  Neurological: Negative for headaches.   Maternal Medical History:  Reason for admission: Contractions.   Contractions: Onset was 13-24 hours ago.   Frequency: regular.   Perceived severity is moderate.    Fetal activity: Perceived fetal activity is normal.    Prenatal Complications - Diabetes: none.      Last menstrual period 10/09/2015. Maternal Exam:  Uterine Assessment: Contraction strength is moderate.  Contraction frequency is regular.   Abdomen: Patient reports no abdominal tenderness. Fetal presentation: vertex  Introitus: Normal vulva. Normal vagina.    Physical Exam  Constitutional: She appears well-developed and well-nourished.  Cardiovascular: Normal rate and regular rhythm.    Respiratory: Effort normal.  GI: Soft.  Genitourinary: Vagina normal.  Neurological: She is alert.  Psychiatric: She has a normal mood and affect.    Prenatal labs: ABO, Rh:  A positive Antibody:  Negative Rubella:  Immune RPR: NON REAC (02/07 1156)  HBsAg: NEGATIVE (02/07 1156)  HIV: NONREACTIVE (02/07 1156)  GBS: Positive (03/29 0000)  First trimester screen and AFP negative Essential panel negative One hour GCT 117 Hemoglobin AA  Assessment/Plan: Pt admitted with contractions and cervical change to 4cm from office exam of 2cm this AM. Epidural prn.  GBS positive so will treat with PCN.   BP slightly elevated on admission, but in pain, so likely from that, will check [place] labs.  Logan Bores 07/04/2016, 2:54 AM

## 2016-07-04 NOTE — Anesthesia Pain Management Evaluation Note (Signed)
  CRNA Pain Management Visit Note  Patient: Anita Potter, 31 y.o., female  "Hello I am a member of the anesthesia team at [facility]. We have an anesthesia team available at all times to provide care throughout the hospital, including epidural management and anesthesia for C-section. I don't know your plan for the delivery whether it a natural birth, water birth, IV sedation, nitrous supplementation, doula or epidural, but we want to meet your pain goals."   1.Was your pain managed to your expectations on prior hospitalizations?   No prior hospitalizations  2.What is your expectation for pain management during this hospitalization?     IV pain meds  3.How can we help you reach that goal? unsure  Record the patient's initial score and the patient's pain goal.   Pain: 6  Pain Goal: 10 [facility] wants you to be able to say your pain was always managed very well.  Casimer Lanius 07/04/2016

## 2016-07-05 LAB — CBC
HCT: 29.9 % — ABNORMAL LOW (ref 36.0–46.0)
Hemoglobin: 10.3 g/dL — ABNORMAL LOW (ref 12.0–15.0)
MCH: 30.4 pg (ref 26.0–34.0)
MCHC: 34.4 g/dL (ref 30.0–36.0)
MCV: 88.2 fL (ref 78.0–100.0)
Platelets: 209 10*3/uL (ref 150–400)
RBC: 3.39 MIL/uL — ABNORMAL LOW (ref 3.87–5.11)
RDW: 13.7 % (ref 11.5–15.5)
WBC: 12.9 10*3/uL — ABNORMAL HIGH (ref 4.0–10.5)

## 2016-07-05 NOTE — Progress Notes (Signed)
PPD #1 No problems Afeb, VSS except BP 140-170/70-90 Fundus firm, NT at U-1 Continue routine postpartum care, monitor BP, circumcision at lunch

## 2016-07-05 NOTE — Lactation Note (Signed)
This note was copied from a baby's chart. Lactation Consultation Note  Patient Name: Anita Potter DCVUD'T Date: 07/05/2016 Reason for consult: Follow-up assessment;Difficult latch Mom attempting latch in cradle hold but baby unable to latch.  Positioned baby in football hold.  Colostrum easily expressed into baby's mouth.  Baby latches for a brief time and then falls off and sucks his tongue.  After several attempts a 24 mm nipple shield applied.  Baby latched and sustained feeding for 10 minutes. Instructed to feed with feeding cues and to call for assist prn.  Maternal Data    Feeding Feeding Type: Breast Fed Length of feed: 10 min  LATCH Score/Interventions Latch: Grasps breast easily, tongue down, lips flanged, rhythmical sucking. (with nipple shield) Intervention(s): Adjust position;Assist with latch;Breast massage;Breast compression  Audible Swallowing: A few with stimulation Intervention(s): Hand expression;Skin to skin Intervention(s): Alternate breast massage  Type of Nipple: Everted at rest and after stimulation  Comfort (Breast/Nipple): Soft / non-tender     Hold (Positioning): Assistance needed to correctly position infant at breast and maintain latch. Intervention(s): Breastfeeding basics reviewed;Support Pillows;Position options;Skin to skin  LATCH Score: 8  Lactation Tools Discussed/Used Tools: Nipple Shields Nipple shield size: 24   Consult Status Consult Status: Follow-up Date: 07/06/16 Follow-up type: In-patient    Ave Filter 07/05/2016, 2:41 PM

## 2016-07-06 MED ORDER — IBUPROFEN 600 MG PO TABS: 600.0000 mg | ORAL_TABLET | Freq: Four times a day (QID) | ORAL | 1 refills | Status: DC

## 2016-07-06 MED ORDER — PRENATAL MULTIVITAMIN CH: 1.0000 | ORAL_TABLET | Freq: Every day | ORAL | 3 refills | Status: DC

## 2016-07-06 NOTE — Discharge Summary (Signed)
OB Discharge Summary     Patient Name: Anita Potter DOB: Mar 05, 1986 MRN: [id number]  Date of admission: 07/04/2016 Delivering MD: Carlynn Purl [facility]   Date of discharge: 07/06/2016  Admitting diagnosis: 38 WEEKS CTX Intrauterine pregnancy: [redacted]w[redacted]d     Secondary diagnosis:  Active Problems:   Indication for care in labor and delivery, antepartum   SVD (spontaneous vaginal delivery)   Postpartum care following vaginal delivery  Additional problems: gbbs +     Discharge diagnosis: Term Pregnancy Delivered                                                                                                Post partum procedures:n/a  Augmentation: n/a  Complications: None  Hospital course:  Onset of Labor With Vaginal Delivery     31 y.o. yo G1P1001 at [redacted]w[redacted]d was admitted in Active Labor on 07/04/2016. Patient had an uncomplicated labor course as follows:  Membrane Rupture Time/Date: 9:29 AM ,07/04/2016   Intrapartum Procedures: Episiotomy: None [1]                                         Lacerations:  2nd degree [3];Periurethral [8];Perineal [11]  Patient had a delivery of a Viable infant. 07/04/2016  Information for the patient's newborn:  Terah, Robey [[id number]]  Delivery Method: Vaginal, Spontaneous Delivery (Filed from Delivery Summary)    Pateint had an uncomplicated postpartum course.  She is ambulating, tolerating a regular diet, passing flatus, and urinating well. Patient is discharged home in stable condition on 07/06/16.   Physical exam  Vitals:   07/05/16 0500 07/05/16 1015 07/05/16 1738 07/06/16 0500  BP: 140/83 (!) 153/94 138/88 (!) 142/88  Pulse: 80 84 99 82  Resp: 18 18 16 18   Temp: 98.1 F (36.7 C) 98.6 F (37 C) 98.4 F (36.9 C) 98.3 F (36.8 C)  TempSrc: Oral Oral Oral Oral  SpO2: 100%  100%   Weight:      Height:       General: alert and no distress Lochia: appropriate Uterine Fundus: firm  Labs: Lab Results  Component Value Date   WBC 12.9 (H) 07/05/2016   HGB 10.3 (L) 07/05/2016   HCT 29.9 (L) 07/05/2016   MCV 88.2 07/05/2016   PLT 209 07/05/2016   CMP Latest Ref Rng & Units 07/04/2016  Glucose 65 - 99 mg/dL 78  BUN 6 - 20 mg/dL 8  Creatinine 0.44 - 1.00 mg/dL 0.50  Sodium 135 - 145 mmol/L 135  Potassium 3.5 - 5.1 mmol/L 4.6  Chloride 101 - 111 mmol/L 105  CO2 22 - 32 mmol/L 21(L)  Calcium 8.9 - 10.3 mg/dL 8.7(L)  Total Protein 6.5 - 8.1 g/dL 6.4(L)  Total Bilirubin 0.3 - 1.2 mg/dL 1.0  Alkaline Phos 38 - 126 U/L 177(H)  AST 15 - 41 U/L 33  ALT 14 - 54 U/L 17    Discharge instruction: per After Visit Summary and "Baby and Me Booklet".  After visit meds:  Allergies as  of 07/06/2016   No Known Allergies     Medication List    TAKE these medications   ibuprofen 600 MG tablet Commonly known as:  ADVIL,MOTRIN Take 1 tablet (600 mg total) by mouth every 6 (six) hours. What changed:  medication strength  how much to take  when to take this  reasons to take this   prenatal multivitamin Tabs tablet Take 1 tablet by mouth daily at 12 noon.       Diet: routine diet  Activity: Advance as tolerated. Pelvic rest for 6 weeks.   Outpatient follow up:6 weeks Follow up Appt:No future appointments. Follow up Visit:No Follow-up on file.  Postpartum contraception: Undecided  Newborn Data: Live born female  Birth Weight: 7 lb 11 oz (3487 g) APGAR: 9, 9  Baby Feeding: Breast Disposition:home with mother   07/06/2016 Janyth Contes, MD

## 2016-07-06 NOTE — Lactation Note (Signed)
This note was copied from a baby's chart. Lactation Consultation Note  Patient Name: Anita Potter CCEQF'D Date: 07/06/2016 Reason for consult: Initial assessment Mom states baby has been nursing well without the nipple shield.  Manual pump given to pre pump breast for a minute prior to latch.  Mom states right nipple is not as erect so baby favors left.  Shells given with instructions.  Mom is very motivated.  Discharge teaching done including engorgement treatment.  No questions at present time.  Lactation outpatient services and support information reviewed and encouraged.  Maternal Data    Feeding Feeding Type: Breast Fed  LATCH Score/Interventions Latch: Grasps breast easily, tongue down, lips flanged, rhythmical sucking. Intervention(s): Breast massage;Breast compression  Audible Swallowing: A few with stimulation Intervention(s): Alternate breast massage  Type of Nipple: Everted at rest and after stimulation  Comfort (Breast/Nipple): Soft / non-tender     Hold (Positioning): No assistance needed to correctly position infant at breast. Intervention(s): Breastfeeding basics reviewed;Support Pillows;Position options;Skin to skin  LATCH Score: 9  Lactation Tools Discussed/Used     Consult Status Consult Status: Complete    Ave Filter 07/06/2016, 9:10 AM

## 2016-07-06 NOTE — Progress Notes (Signed)
Post Partum Day 2 Subjective: no complaints, up ad lib, voiding, tolerating PO and nl lochia, pain controlled  Objective: Blood pressure (!) 142/88, pulse 82, temperature 98.3 F (36.8 C), temperature source Oral, resp. rate 18, height 5\' 5"  (1.651 m), weight 119.7 kg (264 lb), last menstrual period 10/09/2015, SpO2 100 %, unknown if currently breastfeeding.  Physical Exam:  General: alert and no distress Lochia: appropriate Uterine Fundus: firm   Recent Labs  07/04/16 0342 07/05/16 0510  HGB 13.4 10.3*  HCT 38.9 29.9*    Assessment/Plan: Discharge home, Breastfeeding and Lactation consult.  D/c with Motrin and PNV.  f/u 6 weeks   LOS: 2 days   Marcelle Hepner Bovard-Stuckert 07/06/2016, 7:37 AM

## 2016-07-08 ENCOUNTER — Emergency Department (HOSPITAL_COMMUNITY)
Admission: EM | Admit: 2016-07-08 | Discharge: 2016-07-09 | Disposition: A | Discharge status: Home / Self Care | Payer: 59 | Attending: Dermatology | Admitting: Dermatology

## 2016-07-08 ENCOUNTER — Encounter (HOSPITAL_COMMUNITY): Payer: Self-pay | Admitting: Emergency Medicine

## 2016-07-08 DIAGNOSIS — M7989 Other specified soft tissue disorders: Secondary | ICD-10-CM | POA: Insufficient documentation

## 2016-07-08 DIAGNOSIS — Z5321 Procedure and treatment not carried out due to patient leaving prior to being seen by health care provider: Secondary | ICD-10-CM | POA: Insufficient documentation

## 2016-07-08 NOTE — ED Notes (Signed)
Pt c/o swelling in her legs and feet. Pt recently delivered a baby on Tuesday the 17th. Pt is concerned that she has DVT.

## 2016-07-08 NOTE — ED Triage Notes (Signed)
Pt from home with complaints of right leg swelling and pain since she left the hospital. Pt was at the hospital to deliver a baby. Pt is able to ambulate, but states it is getting difficult due to "heaviness in her leg". Pt rates pain at 5/10. Leg is not warm nor is it red. Pt has been taking ibuprofen 600 mg at home, the last dose of which was around 1800 this evening.

## 2016-07-09 ENCOUNTER — Observation Stay (HOSPITAL_COMMUNITY)
Admission: AD | Admit: 2016-07-09 | Discharge: 2016-07-10 | Disposition: A | Discharge status: Home / Self Care | Payer: 59 | Source: Ambulatory Visit | Attending: Obstetrics and Gynecology | Admitting: Obstetrics and Gynecology

## 2016-07-09 ENCOUNTER — Encounter (HOSPITAL_COMMUNITY): Payer: Self-pay | Admitting: Certified Nurse Midwife

## 2016-07-09 DIAGNOSIS — Z79899 Other long term (current) drug therapy: Secondary | ICD-10-CM | POA: Insufficient documentation

## 2016-07-09 DIAGNOSIS — O165 Unspecified maternal hypertension, complicating the puerperium: Secondary | ICD-10-CM

## 2016-07-09 DIAGNOSIS — Z87891 Personal history of nicotine dependence: Secondary | ICD-10-CM | POA: Insufficient documentation

## 2016-07-09 HISTORY — DX: Unspecified maternal hypertension, complicating the puerperium: O16.5

## 2016-07-09 LAB — PROTEIN / CREATININE RATIO, URINE
Creatinine, Urine: 134 mg/dL
Protein Creatinine Ratio: 0.18 mg/mg{Cre} — ABNORMAL HIGH (ref 0.00–0.15)
Total Protein, Urine: 24 mg/dL

## 2016-07-09 LAB — URINALYSIS, ROUTINE W REFLEX MICROSCOPIC
Bacteria, UA: NONE SEEN
Bilirubin Urine: NEGATIVE
Glucose, UA: NEGATIVE mg/dL
Ketones, ur: 20 mg/dL — AB
Leukocytes, UA: NEGATIVE
Nitrite: NEGATIVE
Protein, ur: NEGATIVE mg/dL
Specific Gravity, Urine: 1.021 (ref 1.005–1.030)
pH: 6 (ref 5.0–8.0)

## 2016-07-09 LAB — COMPREHENSIVE METABOLIC PANEL
ALT: 58 U/L — ABNORMAL HIGH (ref 14–54)
AST: 33 U/L (ref 15–41)
Albumin: 3 g/dL — ABNORMAL LOW (ref 3.5–5.0)
Alkaline Phosphatase: 103 U/L (ref 38–126)
Anion gap: 5 (ref 5–15)
BUN: 11 mg/dL (ref 6–20)
CO2: 28 mmol/L (ref 22–32)
Calcium: 8.5 mg/dL — ABNORMAL LOW (ref 8.9–10.3)
Chloride: 107 mmol/L (ref 101–111)
Creatinine, Ser: 0.58 mg/dL (ref 0.44–1.00)
GFR calc Af Amer: >60 mL/min (ref >60)
GFR calc non Af Amer: >60 mL/min (ref >60)
Glucose, Bld: 87 mg/dL (ref 65–99)
Potassium: 4 mmol/L (ref 3.5–5.1)
Sodium: 140 mmol/L (ref 135–145)
Total Bilirubin: 0.4 mg/dL (ref 0.3–1.2)
Total Protein: 6.3 g/dL — ABNORMAL LOW (ref 6.5–8.1)

## 2016-07-09 LAB — CBC
HCT: 30.7 % — ABNORMAL LOW (ref 36.0–46.0)
Hemoglobin: 10.5 g/dL — ABNORMAL LOW (ref 12.0–15.0)
MCH: 30.3 pg (ref 26.0–34.0)
MCHC: 34.2 g/dL (ref 30.0–36.0)
MCV: 88.5 fL (ref 78.0–100.0)
Platelets: 249 10*3/uL (ref 150–400)
RBC: 3.47 MIL/uL — ABNORMAL LOW (ref 3.87–5.11)
RDW: 13.5 % (ref 11.5–15.5)
WBC: 7.6 10*3/uL (ref 4.0–10.5)

## 2016-07-09 MED ORDER — HYDRALAZINE HCL 10 MG PO TABS
10.0000 mg | ORAL_TABLET | Freq: Four times a day (QID) | ORAL | Status: DC
  Administered 2016-07-09 – 2016-07-10 (×6): 10 mg via ORAL
  Filled 2016-07-09 (×9): qty 1

## 2016-07-09 MED ORDER — LABETALOL HCL 100 MG PO TABS
200.0000 mg | ORAL_TABLET | Freq: Once | ORAL | Status: AC
  Administered 2016-07-09: 200 mg via ORAL
  Filled 2016-07-09: qty 2

## 2016-07-09 MED ORDER — PRENATAL MULTIVITAMIN CH: 1.0000 | ORAL_TABLET | Freq: Every day | ORAL | Status: DC

## 2016-07-09 MED ORDER — LABETALOL HCL 5 MG/ML IV SOLN
20.0000 mg | INTRAVENOUS | Status: DC | PRN
  Filled 2016-07-09: qty 4

## 2016-07-09 MED ORDER — ACETAMINOPHEN 325 MG PO TABS
650.0000 mg | ORAL_TABLET | ORAL | Status: DC | PRN
  Administered 2016-07-09 – 2016-07-10 (×3): 650 mg via ORAL
  Filled 2016-07-09 (×3): qty 2

## 2016-07-09 MED ORDER — CALCIUM CARBONATE ANTACID 500 MG PO CHEW: 2.0000 | CHEWABLE_TABLET | ORAL | Status: DC | PRN

## 2016-07-09 MED ORDER — ZOLPIDEM TARTRATE 5 MG PO TABS: 5.0000 mg | ORAL_TABLET | Freq: Every evening | ORAL | Status: DC | PRN

## 2016-07-09 MED ORDER — SODIUM CHLORIDE 0.9% FLUSH
3.0000 mL | Freq: Two times a day (BID) | INTRAVENOUS | Status: DC
  Administered 2016-07-09 – 2016-07-10 (×3): 3 mL via INTRAVENOUS

## 2016-07-09 MED ORDER — PRENATAL MULTIVITAMIN CH
1.0000 | ORAL_TABLET | Freq: Every day | ORAL | Status: DC
Start: 2016-07-09 — End: 2016-07-10
  Administered 2016-07-09 – 2016-07-10 (×2): 1 via ORAL
  Filled 2016-07-09 (×2): qty 1

## 2016-07-09 MED ORDER — BREAST MILK
ORAL | Status: DC
  Filled 2016-07-09: qty 1

## 2016-07-09 MED ORDER — SODIUM CHLORIDE 0.9 % IV SOLN: 250.0000 mL | INTRAVENOUS | Status: DC | PRN

## 2016-07-09 MED ORDER — FUROSEMIDE 40 MG PO TABS
40.0000 mg | ORAL_TABLET | Freq: Once | ORAL | Status: AC
  Administered 2016-07-09: 40 mg via ORAL
  Filled 2016-07-09: qty 1

## 2016-07-09 MED ORDER — LABETALOL HCL 5 MG/ML IV SOLN: 20.0000 mg | INTRAVENOUS | Status: DC | PRN

## 2016-07-09 MED ORDER — HYDRALAZINE HCL 20 MG/ML IJ SOLN
5.0000 mg | INTRAMUSCULAR | Status: DC | PRN
  Administered 2016-07-09: 5 mg via INTRAVENOUS
  Filled 2016-07-09 (×2): qty 1

## 2016-07-09 MED ORDER — SODIUM CHLORIDE 0.9% FLUSH: 3.0000 mL | INTRAVENOUS | Status: DC | PRN

## 2016-07-09 MED ORDER — LABETALOL HCL 100 MG PO TABS
100.0000 mg | ORAL_TABLET | Freq: Three times a day (TID) | ORAL | Status: DC
  Administered 2016-07-09 (×2): 100 mg via ORAL
  Filled 2016-07-09 (×2): qty 1

## 2016-07-09 MED ORDER — DOCUSATE SODIUM 100 MG PO CAPS
100.0000 mg | ORAL_CAPSULE | Freq: Every day | ORAL | Status: DC
  Administered 2016-07-10: 100 mg via ORAL
  Filled 2016-07-09: qty 1

## 2016-07-09 MED ORDER — HYDRALAZINE HCL 20 MG/ML IJ SOLN: 10.0000 mg | Freq: Once | INTRAMUSCULAR | Status: DC | PRN

## 2016-07-09 NOTE — H&P (Signed)
Anita Potter is an 31 y.o. female G1P1 s/p NSVD on 07/04/16 who presented to [facility] with increased LE swelling.  No HA or other symptoms.  She left [facility] when was taking too long to be seen, and came to MAU where BP noted to be in severe range.  160-170/101-113.  Labs are WNL except ALT 58.  She had some elevated BP in labor that improved postpartum to 130//80's.  Prot:creat ratio 0.18  Pertinent Gynecological History:  OB History: NSVD   Menstrual History:  Patient's last menstrual period was 10/09/2015 (approximate).    Past Medical History:  Diagnosis Date  . Hx of chlamydia infection 03/2016  . Uterine fibroid     Past Surgical History:  Procedure Laterality Date  . NO PAST SURGERIES      History reviewed. No pertinent family history.  Social History:  reports that she quit smoking about 14 months ago. Her smoking use included Cigarettes. She smoked 0.50 packs per day. She has never used smokeless tobacco. She reports that she drinks alcohol. She reports that she does not use drugs.  Allergies: No Known Allergies  Prescriptions Prior to Admission  Medication Sig Dispense Refill Last Dose  . ibuprofen (ADVIL,MOTRIN) 600 MG tablet Take 1 tablet (600 mg total) by mouth every 6 (six) hours. 45 tablet 1 07/08/2016 at Unknown time  . Prenatal Vit-Fe Fumarate-FA (PRENATAL MULTIVITAMIN) TABS tablet Take 1 tablet by mouth daily at 12 noon. 100 tablet 3 07/08/2016 at Unknown time    Review of Systems  Eyes: Negative for blurred vision.  Gastrointestinal: Negative for abdominal pain.  Neurological: Negative for headaches.    Blood pressure (!) 162/102, pulse 63, temperature 98.4 F (36.9 C), temperature source Oral, resp. rate 18, height 5\' 5"  (1.651 m), weight 111.1 kg (245 lb), last menstrual period 10/09/2015, SpO2 100 %, unknown if currently breastfeeding. Physical Exam  Constitutional: She appears well-developed.  Cardiovascular: Normal rate.   Respiratory: Effort normal.  GI:  Soft.  Genitourinary: Vagina normal and uterus normal.  Musculoskeletal: She exhibits edema (+1 ). She exhibits no tenderness.    Results for orders placed or performed during the hospital encounter of 07/09/16 (from the past 24 hour(s))  Urinalysis, Routine w reflex microscopic     Status: Abnormal   Collection Time: 07/09/16  2:40 AM  Result Value Ref Range   Color, Urine YELLOW YELLOW   APPearance CLEAR CLEAR   Specific Gravity, Urine 1.021 1.005 - 1.030   pH 6.0 5.0 - 8.0   Glucose, UA NEGATIVE NEGATIVE mg/dL   Hgb urine dipstick MODERATE (A) NEGATIVE   Bilirubin Urine NEGATIVE NEGATIVE   Ketones, ur 20 (A) NEGATIVE mg/dL   Protein, ur NEGATIVE NEGATIVE mg/dL   Nitrite NEGATIVE NEGATIVE   Leukocytes, UA NEGATIVE NEGATIVE   RBC / HPF 0-5 0 - 5 RBC/hpf   WBC, UA 0-5 0 - 5 WBC/hpf   Bacteria, UA NONE SEEN NONE SEEN   Squamous Epithelial / LPF 0-5 (A) NONE SEEN   Mucous PRESENT   Protein / creatinine ratio, urine     Status: Abnormal   Collection Time: 07/09/16  2:40 AM  Result Value Ref Range   Creatinine, Urine 134.00 mg/dL   Total Protein, Urine 24 mg/dL   Protein Creatinine Ratio 0.18 (H) 0.00 - 0.15 mg/mg[Cre]  CBC     Status: Abnormal   Collection Time: 07/09/16  3:09 AM  Result Value Ref Range   WBC 7.6 4.0 - 10.5 K/uL   RBC 3.47 (  L) 3.87 - 5.11 MIL/uL   Hemoglobin 10.5 (L) 12.0 - 15.0 g/dL   HCT 30.7 (L) 36.0 - 46.0 %   MCV 88.5 78.0 - 100.0 fL   MCH 30.3 26.0 - 34.0 pg   MCHC 34.2 30.0 - 36.0 g/dL   RDW 13.5 11.5 - 15.5 %   Platelets 249 150 - 400 K/uL  Comprehensive metabolic panel     Status: Abnormal   Collection Time: 07/09/16  3:09 AM  Result Value Ref Range   Sodium 140 135 - 145 mmol/L   Potassium 4.0 3.5 - 5.1 mmol/L   Chloride 107 101 - 111 mmol/L   CO2 28 22 - 32 mmol/L   Glucose, Bld 87 65 - 99 mg/dL   BUN 11 6 - 20 mg/dL   Creatinine, Ser 0.58 0.44 - 1.00 mg/dL   Calcium 8.5 (L) 8.9 - 10.3 mg/dL   Total Protein 6.3 (L) 6.5 - 8.1 g/dL    Albumin 3.0 (L) 3.5 - 5.0 g/dL   AST 33 15 - 41 U/L   ALT 58 (H) 14 - 54 U/L   Alkaline Phosphatase 103 38 - 126 U/L   Total Bilirubin 0.4 0.3 - 1.2 mg/dL   GFR calc non Af Amer >60 >60 mL/min   GFR calc Af Amer >60 >60 mL/min   Anion gap 5 5 - 15    No results found.  Assessment/Plan: Pt admitted with postpartum BP in severe range, but no other lab abnormalities or preeclampsia symptoms.  She has had multiple attempts at IV access with no success.  We gave her labetalol 200mg  po and lasix orally x 1 while waiting on IV team to try to get access.   If BP will respond to the medications, we will hold off on magnesium since no other lab abnormalities or symptoms, but will need to start if stay in severe range once we have access.    Logan Bores 07/09/2016, 7:51 AM

## 2016-07-09 NOTE — Progress Notes (Signed)
Dr. Marvel Plan notified of unsuccessful attempts at inserting an IV after lasix administration. IV team still planning to come at 7:30 this morning to start a PICC line. Pt denies headache, blurred vision, and epigastric pain at this time. +2 edema noted in the left and right lower extremeties. Told to monitor BP for the next couple of hours. Will continue to monitor.

## 2016-07-09 NOTE — MAU Note (Signed)
Patient presents to MAU with c/o leg, ankle swelling. Patient states she was DC thurs morning. She had HBP during pregnancy and was given medication during labor to decrease BP. Denies headache or blurred vision. Pain in right leg. Denies redness or hot spots.

## 2016-07-09 NOTE — Lactation Note (Signed)
Lactation Consultation Note  Patient Name: Anita Potter BWLSL'H Date: 07/09/2016  Baby DOB 07/04/[birth year]. Mom was re admitted and set for D/C today. She is pumping to feed because baby was having a hard time latching. She is no longer desires to latch baby. Discussed baby behavior, feeding frequency, pumping frequency, baby belly size, voids, wt changes, breast changes, and nipple care. She is aware of lactation services and support group.  Mom will use DEBP 8+/24hr. She will call for lactation as needed.      Denzil Hughes 07/09/2016, 10:34 AM

## 2016-07-09 NOTE — ED Notes (Signed)
Pt called for room x1

## 2016-07-09 NOTE — MAU Provider Note (Signed)
Chief Complaint: Leg Swelling and Hypertension   First Provider Initiated Contact with Patient 07/09/16 0328      SUBJECTIVE HPI: Anita Potter is a 31 y.o. G1P1001 on PPD# 5 following NSVD who presents to maternity admissions reporting swelling in both legs/feet causing pain in both legs, but more on the right.  She presented to [facility] yesterday but wait was too long and she did not stay for evaluation.  She report heaviness in both legs, more on the right. There is no warmth or redness in either leg/foot.  The pain has gradually increased as her swelling increased.  It is not relieved by ibuprofen or elevating her feet. She reports light lochia, denies abdominal pain, vaginal itching/burning, urinary symptoms, h/a, epigastric pain, visual disturbances, dizziness, n/v, or fever/chills.     HPI  Past Medical History:  Diagnosis Date  . Hx of chlamydia infection 03/2016  . Uterine fibroid    Past Surgical History:  Procedure Laterality Date  . NO PAST SURGERIES     Social History   Social History  . Marital status: Single    Spouse name: N/A  . Number of children: N/A  . Years of education: N/A   Occupational History  . Not on file.   Social History Main Topics  . Smoking status: Former Smoker    Packs/day: 0.50    Types: Cigarettes    Quit date: 04/27/2015  . Smokeless tobacco: Never Used  . Alcohol use Yes     Comment: occassionally  . Drug use: No  . Sexual activity: Yes   Other Topics Concern  . Not on file   Social History Narrative  . No narrative on file   No current facility-administered medications on file prior to encounter.    Current Outpatient Prescriptions on File Prior to Encounter  Medication Sig Dispense Refill  . ibuprofen (ADVIL,MOTRIN) 600 MG tablet Take 1 tablet (600 mg total) by mouth every 6 (six) hours. 45 tablet 1  . Prenatal Vit-Fe Fumarate-FA (PRENATAL MULTIVITAMIN) TABS tablet Take 1 tablet by mouth daily at 12 noon. 100 tablet 3   No  Known Allergies  ROS:  Review of Systems  Constitutional: Negative for chills, fatigue and fever.  Eyes: Negative for visual disturbance.  Respiratory: Negative for shortness of breath.   Cardiovascular: Positive for leg swelling. Negative for chest pain.  Gastrointestinal: Negative for abdominal pain, nausea and vomiting.  Genitourinary: Negative for difficulty urinating, dysuria, flank pain, pelvic pain, vaginal bleeding, vaginal discharge and vaginal pain.  Neurological: Negative for dizziness and headaches.  Psychiatric/Behavioral: Negative.      I have reviewed patient's Past Medical Hx, Surgical Hx, Family Hx, Social Hx, medications and allergies.   Physical Exam   Patient Vitals for the past 24 hrs:  BP Temp Temp src Pulse Resp Height Weight  07/09/16 0506 (!) 159/106 - - 65 - - -  07/09/16 0505 (!) 159/106 - - 65 - - -  07/09/16 0333 (!) 158/101 - - 72 - - -  07/09/16 0318 (!) 174/102 - - 70 - - -  07/09/16 0301 (!) 162/103 - - 60 - - -  07/09/16 0256 (!) 169/103 - - 62 - - -  07/09/16 0248 (!) 166/124 99 F (37.2 C) Oral 74 16 5\' 5"  (1.651 m) 245 lb (111.1 kg)   Constitutional: Well-developed, well-nourished female in no acute distress.  HEART: normal rate, heart sounds, regular rhythm RESP: normal effort, lung sounds clear and equal bilaterally GI: Abd soft,  non-tender. Pos BS x 4 MS: Extremities nontender, no edema, normal ROM Neurologic: Alert and oriented x 4.  GU: Neg CVAT.   LAB RESULTS Results for orders placed or performed during the hospital encounter of 07/09/16 (from the past 24 hour(s))  Urinalysis, Routine w reflex microscopic     Status: Abnormal   Collection Time: 07/09/16  2:40 AM  Result Value Ref Range   Color, Urine YELLOW YELLOW   APPearance CLEAR CLEAR   Specific Gravity, Urine 1.021 1.005 - 1.030   pH 6.0 5.0 - 8.0   Glucose, UA NEGATIVE NEGATIVE mg/dL   Hgb urine dipstick MODERATE (A) NEGATIVE   Bilirubin Urine NEGATIVE NEGATIVE    Ketones, ur 20 (A) NEGATIVE mg/dL   Protein, ur NEGATIVE NEGATIVE mg/dL   Nitrite NEGATIVE NEGATIVE   Leukocytes, UA NEGATIVE NEGATIVE   RBC / HPF 0-5 0 - 5 RBC/hpf   WBC, UA 0-5 0 - 5 WBC/hpf   Bacteria, UA NONE SEEN NONE SEEN   Squamous Epithelial / LPF 0-5 (A) NONE SEEN   Mucous PRESENT   Protein / creatinine ratio, urine     Status: Abnormal   Collection Time: 07/09/16  2:40 AM  Result Value Ref Range   Creatinine, Urine 134.00 mg/dL   Total Protein, Urine 24 mg/dL   Protein Creatinine Ratio 0.18 (H) 0.00 - 0.15 mg/mg[Cre]  CBC     Status: Abnormal   Collection Time: 07/09/16  3:09 AM  Result Value Ref Range   WBC 7.6 4.0 - 10.5 K/uL   RBC 3.47 (L) 3.87 - 5.11 MIL/uL   Hemoglobin 10.5 (L) 12.0 - 15.0 g/dL   HCT 30.7 (L) 36.0 - 46.0 %   MCV 88.5 78.0 - 100.0 fL   MCH 30.3 26.0 - 34.0 pg   MCHC 34.2 30.0 - 36.0 g/dL   RDW 13.5 11.5 - 15.5 %   Platelets 249 150 - 400 K/uL  Comprehensive metabolic panel     Status: Abnormal   Collection Time: 07/09/16  3:09 AM  Result Value Ref Range   Sodium 140 135 - 145 mmol/L   Potassium 4.0 3.5 - 5.1 mmol/L   Chloride 107 101 - 111 mmol/L   CO2 28 22 - 32 mmol/L   Glucose, Bld 87 65 - 99 mg/dL   BUN 11 6 - 20 mg/dL   Creatinine, Ser 0.58 0.44 - 1.00 mg/dL   Calcium 8.5 (L) 8.9 - 10.3 mg/dL   Total Protein 6.3 (L) 6.5 - 8.1 g/dL   Albumin 3.0 (L) 3.5 - 5.0 g/dL   AST 33 15 - 41 U/L   ALT 58 (H) 14 - 54 U/L   Alkaline Phosphatase 103 38 - 126 U/L   Total Bilirubin 0.4 0.3 - 1.2 mg/dL   GFR calc non Af Amer >60 >60 mL/min   GFR calc Af Amer >60 >60 mL/min   Anion gap 5 5 - 15    --/--/A POS, A POS (04/17 0342)  IMAGING No results found.  MAU Management/MDM: Ordered preeclampsia labs and reviewed results.  Preeclampsia focused order set initiated r/t severe range BPs.  Several RNs, nurse anesthetist, and anesthesiologist unable to start peripheral IV.  Consult Dr Marvel Plan with assessment and findings.  Labetalol 200 mg PO and  Lasix 40 mg PO given in MAU.  Admit to [place] Unit for continued BP management.  House coverage consulted for central line IV access if needed.  IV team to come at 7:30 am to start PICC line  but House Coverage RN to look at pt again after Lasix to see if IV access is possible.  Breast pump and supplies provided while pt in MAU. Discussed options/policy for rooming in with infant if pt desires.  Pt stable at time of transfer..  ASSESSMENT 1. Postpartum hypertension     PLAN Admit to [facility] Unit PO labetalol and Lasix for now Plan for RN to evaluate pt for peripheral IV access after Lasix begins working If no access, PICC line to be placed in am for BP meds as needed RN to call Dr Marvel Plan before PICC line placed Preeclampsia precautions reviewed with pt, to notify staff if symptoms develop     Fatima Blank Certified Nurse-Midwife 07/09/2016  5:17 AM

## 2016-07-09 NOTE — Progress Notes (Signed)
Patient ID: Anita Potter, female   DOB: July 04, 1985, 31 y.o.   MRN: [id number] IV was placed by Dr. Royce Macadamia and pt received IV hydralazine 5mg  IV with excellent response of BP to 140/80 range this AM, started on 10mg  po hydralazine BID but still running a bit high.  WIll add labetalol 100mg  po TID as well.

## 2016-07-10 LAB — COMPREHENSIVE METABOLIC PANEL
ALT: 50 U/L (ref 14–54)
AST: 25 U/L (ref 15–41)
Albumin: 2.8 g/dL — ABNORMAL LOW (ref 3.5–5.0)
Alkaline Phosphatase: 97 U/L (ref 38–126)
Anion gap: 7 (ref 5–15)
BUN: 9 mg/dL (ref 6–20)
CO2: 26 mmol/L (ref 22–32)
Calcium: 8.3 mg/dL — ABNORMAL LOW (ref 8.9–10.3)
Chloride: 105 mmol/L (ref 101–111)
Creatinine, Ser: 0.63 mg/dL (ref 0.44–1.00)
GFR calc Af Amer: >60 mL/min (ref >60)
GFR calc non Af Amer: >60 mL/min (ref >60)
Glucose, Bld: 85 mg/dL (ref 65–99)
Potassium: 3.3 mmol/L — ABNORMAL LOW (ref 3.5–5.1)
Sodium: 138 mmol/L (ref 135–145)
Total Bilirubin: 0.5 mg/dL (ref 0.3–1.2)
Total Protein: 6.2 g/dL — ABNORMAL LOW (ref 6.5–8.1)

## 2016-07-10 LAB — CBC
HCT: 31.5 % — ABNORMAL LOW (ref 36.0–46.0)
Hemoglobin: 10.9 g/dL — ABNORMAL LOW (ref 12.0–15.0)
MCH: 30.4 pg (ref 26.0–34.0)
MCHC: 34.6 g/dL (ref 30.0–36.0)
MCV: 88 fL (ref 78.0–100.0)
Platelets: 294 10*3/uL (ref 150–400)
RBC: 3.58 MIL/uL — ABNORMAL LOW (ref 3.87–5.11)
RDW: 13.3 % (ref 11.5–15.5)
WBC: 8.3 10*3/uL (ref 4.0–10.5)

## 2016-07-10 MED ORDER — HYDRALAZINE HCL 10 MG PO TABS: 10.0000 mg | ORAL_TABLET | Freq: Four times a day (QID) | ORAL | 1 refills | Status: DC

## 2016-07-10 MED ORDER — NIFEDIPINE ER 60 MG PO TB24: 60.0000 mg | ORAL_TABLET | Freq: Every day | ORAL | 1 refills | Status: DC

## 2016-07-10 MED ORDER — NIFEDIPINE ER OSMOTIC RELEASE 30 MG PO TB24
60.0000 mg | ORAL_TABLET | Freq: Every day | ORAL | Status: DC
  Administered 2016-07-10: 60 mg via ORAL
  Filled 2016-07-10: qty 2

## 2016-07-10 NOTE — Progress Notes (Signed)
HD#2 postpartum HTN, PPD #6 Feeling ok, had headache earlier which is now gone Afeb, VSS, BP140-160/80-90 Fundus firm Labs stable-ALT lower BP not quite controlled yet on Hydralazine 10 mg po q 6 hrs ans Procardia XL 60 mg daily, meds just changed earlier this morning.  Will monitor BP today, adjust meds as needed, plan to keep here today unless BP normal

## 2016-07-10 NOTE — Progress Notes (Signed)
Attempted visit with Sia to offer support and introduce spiritual care services.  Pt was not available at this time.  Please page as further needs arise.  Donald Prose. Elyn Peers, M.Div. [facility] Chaplain Pager [phone] Office [phone]     07/10/16 1524  Clinical Encounter Type  Visited With Patient not available

## 2016-07-10 NOTE — Progress Notes (Signed)
Feels fine Afeb, BP 130-140/80-90 Will d/c home on Hydralazine and procardia

## 2016-07-10 NOTE — Discharge Instructions (Signed)
Call for persistent headache, visual changes, shortness of breath or chest pain

## 2016-07-10 NOTE — Progress Notes (Signed)
Dr. Marvel Plan notified of pt's elevated BP of 161/96. Pt denies headache, blurred vision, and epigastric pain at this time. See new orders. Will continue to monitor BP.

## 2016-07-11 NOTE — Discharge Summary (Signed)
Physician Discharge Summary  Patient ID: Anita Potter MRN: [id number] DOB/AGE: 06-21-1985 31 y.o.  Admit date: 07/09/2016 Discharge date: 07/11/2016  Admission Diagnoses:  Postpartum hypertension  Discharge Diagnoses: Same Active Problems:   Postpartum hypertension   Discharged Condition: good  Hospital Course: Pt admitted, received IV hydralazine followed by PO hydralazine and Procardia to control BP.  Labs normal except for one slightly elevated LFT that improved.  After 24+ hours of meds and observation her BP was improved and stable and she felt better and was stable for discharge home.   Discharge Exam: Blood pressure 139/82, pulse (!) 104, temperature 98 F (36.7 C), temperature source Oral, resp. rate 18, height 5\' 5"  (1.651 m), weight 111.1 kg (245 lb), last menstrual period 10/09/2015, SpO2 100 %, unknown if currently breastfeeding. General appearance: alert  Disposition: 01-Home or Self Care  Discharge Instructions    Diet - low sodium heart healthy    Complete by:  As directed      Allergies as of 07/10/2016   No Known Allergies     Medication List    TAKE these medications   hydrALAZINE 10 MG tablet Commonly known as:  APRESOLINE Take 1 tablet (10 mg total) by mouth every 6 (six) hours.   ibuprofen 600 MG tablet Commonly known as:  ADVIL,MOTRIN Take 1 tablet (600 mg total) by mouth every 6 (six) hours.   NIFEdipine 60 MG 24 hr tablet Commonly known as:  PROCARDIA-XL/ADALAT CC Take 1 tablet (60 mg total) by mouth daily.   prenatal multivitamin Tabs tablet Take 1 tablet by mouth daily at 12 noon. What changed:  when to take this      Follow-up Information    Clarene Duke, MD. Schedule an appointment as soon as possible for a visit.   Specialty:  Obstetrics and Gynecology Contact information: [address] [place] [address] [place] [address] [phone]           Signed: Blane Ohara Lynnet Hefley 07/11/2016, 8:27 PM

## 2017-03-20 NOTE — L&D Delivery Note (Signed)
Patient is 32 y.o. G2P1001 [redacted]w[redacted]d admitted for IOL for cHTN. S/p IOL with cytotec, followed by Pitocin.   Delivery Note At 7:35 PM a viable female was delivered via Vaginal, Spontaneous (Presentation: OA; cephalic  ).  APGAR: 8, 9; weight pending .   Placenta status: intact  , 3Vessel .  Cord:   Head delivered OA. Body cord present. Shoulder and body delivered in usual fashion. Infant with spontaneous cry, placed on mother's abdomen, dried and bulb suctioned. Cord clamped x 2 after 1-minute delay, and cut by family member. Cord blood drawn. Placenta delivered spontaneously with gentle cord traction. Fundus firm with massage and Pitocin. Perineum inspected and found to have 1st degree perineal laceration, which was repaired with 3.0 vicryl with good hemostasis achieved.  Anesthesia:  epidural Episiotomy:  none Lacerations:  1st degree perineal Suture Repair: 3.0 vicryl Est. Blood Loss (mL):  200  Mom to postpartum.  Baby to Couplet care / Skin to Skin.  Anita Potter 10/31/2017, 7:59 PM

## 2017-04-03 ENCOUNTER — Encounter: Payer: Self-pay | Admitting: *Deleted

## 2017-04-25 ENCOUNTER — Encounter: Payer: Self-pay | Admitting: Nurse Practitioner

## 2017-04-25 ENCOUNTER — Encounter: Payer: Self-pay | Admitting: General Practice

## 2017-04-25 ENCOUNTER — Ambulatory Visit (INDEPENDENT_AMBULATORY_CARE_PROVIDER_SITE_OTHER): Payer: 59 | Admitting: Nurse Practitioner

## 2017-04-25 DIAGNOSIS — Z113 Encounter for screening for infections with a predominantly sexual mode of transmission: Secondary | ICD-10-CM

## 2017-04-25 DIAGNOSIS — O0990 Supervision of high risk pregnancy, unspecified, unspecified trimester: Secondary | ICD-10-CM

## 2017-04-25 DIAGNOSIS — O09899 Supervision of other high risk pregnancies, unspecified trimester: Secondary | ICD-10-CM | POA: Insufficient documentation

## 2017-04-25 DIAGNOSIS — E669 Obesity, unspecified: Secondary | ICD-10-CM

## 2017-04-25 DIAGNOSIS — O10919 Unspecified pre-existing hypertension complicating pregnancy, unspecified trimester: Secondary | ICD-10-CM | POA: Insufficient documentation

## 2017-04-25 DIAGNOSIS — O0991 Supervision of high risk pregnancy, unspecified, first trimester: Secondary | ICD-10-CM

## 2017-04-25 DIAGNOSIS — O10911 Unspecified pre-existing hypertension complicating pregnancy, first trimester: Secondary | ICD-10-CM

## 2017-04-25 LAB — POCT URINALYSIS DIP (DEVICE)
Bilirubin Urine: NEGATIVE
Glucose, UA: NEGATIVE mg/dL
Ketones, ur: NEGATIVE mg/dL
Leukocytes, UA: NEGATIVE
Nitrite: NEGATIVE
Protein, ur: NEGATIVE mg/dL
Specific Gravity, Urine: >=1.03 (ref 1.005–1.030)
Urobilinogen, UA: 0.2 mg/dL (ref 0.0–1.0)
pH: 6.5 (ref 5.0–8.0)

## 2017-04-25 MED ORDER — LABETALOL HCL 200 MG PO TABS: 200.0000 mg | ORAL_TABLET | Freq: Two times a day (BID) | ORAL | 6 refills | Status: DC

## 2017-04-25 NOTE — Patient Instructions (Addendum)
[email or web address] for contraceptive methods  Get a baby aspirin and chew one daily.  81 mg  Get your blood pressure medicine from your pharmacy and take it as directed throughout the pregnancy.  Do a 24 hour urine.   First Trimester of Pregnancy The first trimester of pregnancy is from week 1 until the end of week 13 (months 1 through 3). During this time, your baby will begin to develop inside you. At 6-8 weeks, the eyes and face are formed, and the heartbeat can be seen on ultrasound. At the end of 12 weeks, all the baby's organs are formed. Prenatal care is all the medical care you receive before the birth of your baby. Make sure you get good prenatal care and follow all of your doctor's instructions. Follow these instructions at home: Medicines  Take over-the-counter and prescription medicines only as told by your doctor. Some medicines are safe and some medicines are not safe during pregnancy.  Take a prenatal vitamin that contains at least 600 micrograms (mcg) of folic acid.  If you have trouble pooping (constipation), take medicine that will make your stool soft (stool softener) if your doctor approves. Eating and drinking  Eat regular, healthy meals.  Your doctor will tell you the amount of weight gain that is right for you.  Avoid raw meat and uncooked cheese.  If you feel sick to your stomach (nauseous) or throw up (vomit): ? Eat 4 or 5 small meals a day instead of 3 large meals. ? Try eating a few soda crackers. ? Drink liquids between meals instead of during meals.  To prevent constipation: ? Eat foods that are high in fiber, like fresh fruits and vegetables, whole grains, and beans. ? Drink enough fluids to keep your pee (urine) clear or pale yellow. Activity  Exercise only as told by your doctor. Stop exercising if you have cramps or pain in your lower belly (abdomen) or low back.  Do not exercise if it is too hot, too humid, or if you are in a place of great height  (high altitude).  Try to avoid standing for long periods of time. Move your legs often if you must stand in one place for a long time.  Avoid heavy lifting.  Wear low-heeled shoes. Sit and stand up straight.  You can have sex unless your doctor tells you not to. Relieving pain and discomfort  Wear a good support bra if your breasts are sore.  Take warm water baths (sitz baths) to soothe pain or discomfort caused by hemorrhoids. Use hemorrhoid cream if your doctor says it is okay.  Rest with your legs raised if you have leg cramps or low back pain.  If you have puffy, bulging veins (varicose veins) in your legs: ? Wear support hose or compression stockings as told by your doctor. ? Raise (elevate) your feet for 15 minutes, 3-4 times a day. ? Limit salt in your food. Prenatal care  Schedule your prenatal visits by the twelfth week of pregnancy.  Write down your questions. Take them to your prenatal visits.  Keep all your prenatal visits as told by your doctor. This is important. Safety  Wear your seat belt at all times when driving.  Make a list of emergency phone numbers. The list should include numbers for family, friends, the hospital, and police and fire departments. General instructions  Ask your doctor for a referral to a local prenatal class. Begin classes no later than at the start of month 6  of your pregnancy.  Ask for help if you need counseling or if you need help with nutrition. Your doctor can give you advice or tell you where to go for help.  Do not use hot tubs, steam rooms, or saunas.  Do not douche or use tampons or scented sanitary pads.  Do not cross your legs for long periods of time.  Avoid all herbs and alcohol. Avoid drugs that are not approved by your doctor.  Do not use any tobacco products, including cigarettes, chewing tobacco, and electronic cigarettes. If you need help quitting, ask your doctor. You may get counseling or other support to help  you quit.  Avoid cat litter boxes and soil used by cats. These carry germs that can cause birth defects in the baby and can cause a loss of your baby (miscarriage) or stillbirth.  Visit your dentist. At home, brush your teeth with a soft toothbrush. Be gentle when you floss. Contact a doctor if:  You are dizzy.  You have mild cramps or pressure in your lower belly.  You have a nagging pain in your belly area.  You continue to feel sick to your stomach, you throw up, or you have watery poop (diarrhea).  You have a bad smelling fluid coming from your vagina.  You have pain when you pee (urinate).  You have increased puffiness (swelling) in your face, hands, legs, or ankles. Get help right away if:  You have a fever.  You are leaking fluid from your vagina.  You have spotting or bleeding from your vagina.  You have very bad belly cramping or pain.  You gain or lose weight rapidly.  You throw up blood. It may look like coffee grounds.  You are around people who have [place] measles, fifth disease, or chickenpox.  You have a very bad headache.  You have shortness of breath.  You have any kind of trauma, such as from a fall or a car accident. Summary  The first trimester of pregnancy is from week 1 until the end of week 13 (months 1 through 3).  To take care of yourself and your unborn baby, you will need to eat healthy meals, take medicines only if your doctor tells you to do so, and do activities that are safe for you and your baby.  Keep all follow-up visits as told by your doctor. This is important as your doctor will have to ensure that your baby is healthy and growing well. This information is not intended to replace advice given to you by your health care provider. Make sure you discuss any questions you have with your health care provider. Document Released: 08/23/2007 Document Revised: 03/14/2016 Document Reviewed: 03/14/2016 Elsevier Interactive Patient Education   2017 [employer].

## 2017-04-25 NOTE — Progress Notes (Signed)
Subjective:   Anita Potter is a 32 y.o. G2P1001 at 35w5dby LMP being seen today for her first obstetrical visit.  Her obstetrical history is significant for obesity and chronic hypertension. Patient does intend to breast feed. Pregnancy history fully reviewed.  Last baby is currently 966months of age.  Was using condoms for contraception and this is an unplanned pregnancy.  History is significant for postpartum readmission for BPs in severe range.  Had some mild elevation of BP in hospital at delivery but was not discharged on any BP meds.  Did take BP meds for a short time but is not on BP medication currently.  Patient reports no complaints.  Has occasional nausea - has not had vomiting.  HISTORY: Obstetric History   G2   P1   T1   P0   A0   L1    SAB0   TAB0   Ectopic0   Multiple0   Live Births1     # Outcome Date GA Lbr Len/2nd Weight Sex Delivery Anes PTL Lv  2 Current           1 Term 07/04/16 [id number]:20 / 00:11 7 lb 11 oz (3.487 kg) M Vag-Spont None  LIV     Name: JACKSON,BOY Tyjae     Apgar1:  9                Apgar5: 9     Past Medical History:  Diagnosis Date  . Hx of chlamydia infection 03/2016  . Hypertension   . Postpartum hypertension 07/09/2016  . Uterine fibroid    Past Surgical History:  Procedure Laterality Date  . NO PAST SURGERIES     Family History  Problem Relation Age of Onset  . Hypertension Mother   . Cancer Father    Social History   Tobacco Use  . Smoking status: Former Smoker    Packs/day: 0.50    Types: Cigarettes    Last attempt to quit: 04/27/2015    Years since quitting: 1.9  . Smokeless tobacco: Never Used  Substance Use Topics  . Alcohol use: Yes    Comment: occassionally  . Drug use: No   No Known Allergies Current Outpatient Medications on File Prior to Visit  Medication Sig Dispense Refill  . Prenatal Vit-Fe Fumarate-FA (PRENATAL MULTIVITAMIN) TABS tablet Take 1 tablet by mouth daily at 12 noon. (Patient taking  differently: Take 1 tablet by mouth daily. ) 100 tablet 3   No current facility-administered medications on file prior to visit.      Exam   Vitals:   04/25/17 0854  BP: (!) 151/80  Pulse: 83  Weight: 225 lb (102.1 kg)  Repeat BP 144/88  Fetal Heart Rate (bpm): 159  Uterus:  Fundal Height: 12 cm  Pelvic Exam: deferred Pap done in 2017 and was normal - due in 2020                      System: General: well-developed, well-nourished female in no acute distress   Breast:  normal appearance, no masses or tenderness, slight inversion on right nipple - always has had this   Skin: normal coloration and turgor, no rashes   Neurologic: oriented, normal, negative, normal mood   Extremities: normal strength, tone, and muscle mass, ROM of all joints is normal   HEENT PERRLA, extraocular movement intact and sclera clear, anicteric   Mouth/Teeth mucous membranes moist, pharynx normal without lesions and dental  hygiene good   Neck supple and no masses   Cardiovascular: regular rate and rhythm   Respiratory:  no respiratory distress, normal breath sounds   Abdomen: soft, non-tender; bowel sounds normal; no masses,  no organomegaly     Assessment:   Pregnancy: G2P1001 Patient Active Problem List   Diagnosis Date Noted  . Supervision of high risk pregnancy, antepartum 04/25/2017  . Chronic hypertension during pregnancy, antepartum 04/25/2017  . Obesity (BMI 35.0-39.9 without comorbidity) 04/25/2017  . Short interval between pregnancies affecting pregnancy, antepartum 04/25/2017  . Vertigo 08/15/2013     Plan:  1. Supervision of high risk pregnancy, antepartum Obesity in pregnancy also - plan no more than gaining 20 pounds in pregnancy  - Enroll Patient in Babyscripts - Obstetric Panel, Including HIV - Culture, OB Urine - Cystic fibrosis gene test - SMN1 COPY NUMBER ANALYSIS (SMA Carrier Screen) - Hemoglobinopathy Evaluation - Cervicovaginal ancillary only - [place] MFM OB DETAIL  +14 WK; Future - Comp Met (CMET) - Creatinine, Urine  2. Chronic hypertension during pregnancy, antepartum Consult with Dr. Hulan Fray - will get 24 hour urine and prescribe ASA and Labetalol - TSH - Protein / creatinine ratio, urine  Drink at least 8 8-oz glasses of water every day. Initial labs drawn. Continue prenatal vitamins. Genetic Screening discussed, First trimester screen: declined.  Will plan to get quad screen. Ultrasound discussed; fetal anatomic survey: ordered. Problem list reviewed and updated. The nature of [place] with multiple MDs and other Advanced Practice Providers was explained to patient; also emphasized that residents, students are part of our team. Routine obstetric precautions reviewed. Return in about 2 weeks (around 05/09/2017).  Total face-to-face time with patient: 40 minutes.  Over 50% of encounter was spent on counseling and coordination of care.     Earlie Server, FNP Family Nurse Practitioner, [facility] for [employer], [facility] 04/25/2017 12:20 PM

## 2017-04-25 NOTE — Progress Notes (Signed)
BP re-check: 144/88.Pt declined Flu Shot,Pt offered Panarama & declined

## 2017-04-26 LAB — CREATININE, URINE, RANDOM: Creatinine, Urine: 186.1 mg/dL

## 2017-04-26 LAB — CERVICOVAGINAL ANCILLARY ONLY
Chlamydia: NEGATIVE
Neisseria Gonorrhea: NEGATIVE

## 2017-04-27 ENCOUNTER — Other Ambulatory Visit: Payer: Self-pay | Admitting: Nurse Practitioner

## 2017-04-27 DIAGNOSIS — O10919 Unspecified pre-existing hypertension complicating pregnancy, unspecified trimester: Secondary | ICD-10-CM

## 2017-04-27 LAB — CREATININE CLEARANCE, URINE, 24 HOUR
Creatinine Clearance: 165 mL/min — ABNORMAL HIGH (ref 88–128)
Creatinine, 24H Ur: 1167 mg/24 hr (ref 800–1800)
Creatinine, Ser: 0.49 mg/dL — ABNORMAL LOW (ref 0.57–1.00)
Creatinine, Urine: 131.1 mg/dL
GFR calc Af Amer: 152 mL/min/{1.73_m2} (ref >59)
GFR calc non Af Amer: 132 mL/min/{1.73_m2} (ref >59)

## 2017-04-27 LAB — CULTURE, OB URINE

## 2017-04-27 LAB — PROTEIN / CREATININE RATIO, URINE
Protein, Ur: 22.8 mg/dL
Protein/Creat Ratio: 174 mg/g creat (ref 0–200)

## 2017-04-27 LAB — URINE CULTURE, OB REFLEX: Organism ID, Bacteria: NO GROWTH

## 2017-04-27 NOTE — Progress Notes (Signed)
Incorrect order done on 24 hour urine.  Corrected the order to total protein for 24 hour urine.  Earlie Server, RN, MSN, NP-BC Nurse Practitioner, [facility] for [employer], [facility] 04/27/2017 3:33 PM

## 2017-05-01 LAB — OBSTETRIC PANEL, INCLUDING HIV
Antibody Screen: NEGATIVE
Basophils Absolute: 0 10*3/uL (ref 0.0–0.2)
Basos: 0 %
EOS (ABSOLUTE): 0.1 10*3/uL (ref 0.0–0.4)
Eos: 1 %
HIV Screen 4th Generation wRfx: NONREACTIVE
Hematocrit: 39.4 % (ref 34.0–46.6)
Hemoglobin: 13.4 g/dL (ref 11.1–15.9)
Hepatitis B Surface Ag: NEGATIVE
Immature Grans (Abs): 0 10*3/uL (ref 0.0–0.1)
Immature Granulocytes: 0 %
Lymphocytes Absolute: 2.4 10*3/uL (ref 0.7–3.1)
Lymphs: 25 %
MCH: 30 pg (ref 26.6–33.0)
MCHC: 34 g/dL (ref 31.5–35.7)
MCV: 88 fL (ref 79–97)
Monocytes Absolute: 0.7 10*3/uL (ref 0.1–0.9)
Monocytes: 7 %
Neutrophils Absolute: 6.5 10*3/uL (ref 1.4–7.0)
Neutrophils: 67 %
Platelets: 293 10*3/uL (ref 150–379)
RBC: 4.47 x10E6/uL (ref 3.77–5.28)
RDW: 13.8 % (ref 12.3–15.4)
RPR Ser Ql: NONREACTIVE
Rh Factor: POSITIVE
Rubella Antibodies, IGG: 1.98 index (ref >0.99)
WBC: 9.7 10*3/uL (ref 3.4–10.8)

## 2017-05-01 LAB — COMPREHENSIVE METABOLIC PANEL
ALT: 11 IU/L (ref 0–32)
AST: 13 IU/L (ref 0–40)
Albumin/Globulin Ratio: 1.2 (ref 1.2–2.2)
Albumin: 3.8 g/dL (ref 3.5–5.5)
Alkaline Phosphatase: 66 IU/L (ref 39–117)
BUN/Creatinine Ratio: 10 (ref 9–23)
BUN: 5 mg/dL — ABNORMAL LOW (ref 6–20)
Bilirubin Total: 0.2 mg/dL (ref 0.0–1.2)
CO2: 22 mmol/L (ref 20–29)
Calcium: 9.7 mg/dL (ref 8.7–10.2)
Chloride: 103 mmol/L (ref 96–106)
Creatinine, Ser: 0.51 mg/dL — ABNORMAL LOW (ref 0.57–1.00)
GFR calc Af Amer: 150 mL/min/{1.73_m2} (ref >59)
GFR calc non Af Amer: 130 mL/min/{1.73_m2} (ref >59)
Globulin, Total: 3.1 g/dL (ref 1.5–4.5)
Glucose: 96 mg/dL (ref 65–99)
Potassium: 3.9 mmol/L (ref 3.5–5.2)
Sodium: 139 mmol/L (ref 134–144)
Total Protein: 6.9 g/dL (ref 6.0–8.5)

## 2017-05-01 LAB — HEMOGLOBINOPATHY EVALUATION
Ferritin: 41 ng/mL (ref 15–150)
Hgb A2 Quant: 2.4 % (ref 1.8–3.2)
Hgb A: 97.6 % (ref 96.4–98.8)
Hgb C: 0 %
Hgb F Quant: 0 % (ref 0.0–2.0)
Hgb S: 0 %
Hgb Solubility: NEGATIVE
Hgb Variant: 0 %

## 2017-05-01 LAB — TSH: TSH: 0.58 u[IU]/mL (ref 0.450–4.500)

## 2017-05-01 LAB — CYSTIC FIBROSIS GENE TEST

## 2017-05-03 LAB — SMN1 COPY NUMBER ANALYSIS (SMA CARRIER SCREENING)

## 2017-05-10 ENCOUNTER — Encounter: Payer: Self-pay | Admitting: Obstetrics & Gynecology

## 2017-05-10 ENCOUNTER — Ambulatory Visit (INDEPENDENT_AMBULATORY_CARE_PROVIDER_SITE_OTHER): Payer: 59 | Admitting: Obstetrics & Gynecology

## 2017-05-10 DIAGNOSIS — O0990 Supervision of high risk pregnancy, unspecified, unspecified trimester: Secondary | ICD-10-CM

## 2017-05-10 NOTE — Progress Notes (Signed)
   PRENATAL VISIT NOTE  Subjective:  Anita Potter is a 32 y.o. G2P1001 at [redacted]w[redacted]d being seen today for ongoing prenatal care.  She is currently monitored for the following issues for this high-risk pregnancy and has Supervision of high risk pregnancy, antepartum; Vertigo; Chronic hypertension during pregnancy, antepartum; Obesity (BMI 35.0-39.9 without comorbidity); and Short interval between pregnancies affecting pregnancy, antepartum on their problem list.  Patient reports no complaints.  Contractions: Not present. Vag. Bleeding: None.  Movement: Present. Denies leaking of fluid.   The following portions of the patient's history were reviewed and updated as appropriate: allergies, current medications, past family history, past medical history, past social history, past surgical history and problem list. Problem list updated.  Objective:   Vitals:   05/10/17 1408  BP: 126/78  Pulse: 77  Weight: 227 lb 11.2 oz (103.3 kg)    Fetal Status:     Movement: Present     General:  Alert, oriented and cooperative. Patient is in no acute distress.  Skin: Skin is warm and dry. No rash noted.   Cardiovascular: Normal heart rate noted  Respiratory: Normal respiratory effort, no problems with respiration noted  Abdomen: Soft, gravid, appropriate for gestational age.  Pain/Pressure: Absent     Pelvic: Cervical exam deferred        Extremities: Normal range of motion.  Edema: None  Mental Status:  Normal mood and affect. Normal behavior. Normal judgment and thought content.   Assessment and Plan:  Pregnancy: G2P1001 at [redacted]w[redacted]d  1. Supervision of high risk pregnancy, antepartum Refuses all genetic testing and flu shot.   Has Anatomy US scheduled.   2.  CHTN BP well controlled.  Pt is not on labetalol at this time.  If BP becomes elevated, will start.  Pt is taking ASA  Preterm labor symptoms and general obstetric precautions including but not limited to vaginal bleeding, contractions, leaking  of fluid and fetal movement were reviewed in detail with the patient. Please refer to After Visit Summary for other counseling recommendations.   RTC 4 weeks  Silas Sacramento, MD

## 2017-05-17 ENCOUNTER — Telehealth: Payer: Self-pay

## 2017-05-17 ENCOUNTER — Other Ambulatory Visit: Payer: Self-pay | Admitting: Nurse Practitioner

## 2017-05-17 NOTE — Progress Notes (Signed)
Talked with client by phone.  Is currently not taking Labetalol.  Apparently her pharmacy was requesting a 90 day supply of labetalol.  Prescription not done today as client is currently not on labetalol.  Earlie Server, RN, MSN, NP-BC Nurse Practitioner, [facility] for [employer], [place] [facility] 05/17/2017 12:24 PM

## 2017-05-17 NOTE — Telephone Encounter (Signed)
-----   Message from Virginia Rochester, NP sent at 05/17/2017 12:24 PM EST ----- Regarding: RE: 90 days Rx Talked with client.  She is currently not taking labetalol.  Will not refill.  She did not request this refill.  Must have been an automatic refill request.  Note made in chart.   ----- Message ----- From: Tamela Oddi, RMA Sent: 05/17/2017  12:01 PM To: Virginia Rochester, NP Subject: 90 days Rx                                     Patient is requesting 90 day supply of Labetalol to be sent to [facility].

## 2017-05-17 NOTE — Telephone Encounter (Signed)
-----   Message from Virginia Rochester, NP sent at  05/17/2017 12:24 PM EST -----  Regarding: RE: 90 days Rx Talked with client.  She is currently not taking labetalol.  Will not refill.  She did not request this refill.  Must have been an automatic refill request.  Note made in chart.   ----- Message ----- From: Tamela Oddi, RMA Sent: 05/17/2017  12:01 PM To: Virginia Rochester, NP Subject: 90 days Rx                                     Patient is requesting 90 day supply of Labetalol to be sent to [facility]. Please advice.

## 2017-06-08 ENCOUNTER — Telehealth: Payer: Self-pay

## 2017-06-08 NOTE — Telephone Encounter (Signed)
TC to pt stating she is having cramping, and believes she lost her mucous plug Pt denies any vaginal bleeding or contractions. Pt is worried advised go to [facility]U for an evaluation.for assurance Since our office is now closed.

## 2017-06-09 ENCOUNTER — Encounter (HOSPITAL_COMMUNITY): Payer: Self-pay | Admitting: *Deleted

## 2017-06-09 ENCOUNTER — Inpatient Hospital Stay (HOSPITAL_COMMUNITY)
Admission: AD | Admit: 2017-06-09 | Discharge: 2017-06-09 | Disposition: A | Discharge status: Home / Self Care | Payer: 59 | Source: Ambulatory Visit | Attending: Obstetrics and Gynecology | Admitting: Obstetrics and Gynecology

## 2017-06-09 DIAGNOSIS — O162 Unspecified maternal hypertension, second trimester: Secondary | ICD-10-CM | POA: Insufficient documentation

## 2017-06-09 DIAGNOSIS — N898 Other specified noninflammatory disorders of vagina: Secondary | ICD-10-CM | POA: Diagnosis not present

## 2017-06-09 DIAGNOSIS — R102 Pelvic and perineal pain: Secondary | ICD-10-CM | POA: Diagnosis not present

## 2017-06-09 DIAGNOSIS — N949 Unspecified condition associated with female genital organs and menstrual cycle: Secondary | ICD-10-CM

## 2017-06-09 DIAGNOSIS — O26892 Other specified pregnancy related conditions, second trimester: Secondary | ICD-10-CM | POA: Insufficient documentation

## 2017-06-09 DIAGNOSIS — O0990 Supervision of high risk pregnancy, unspecified, unspecified trimester: Secondary | ICD-10-CM

## 2017-06-09 DIAGNOSIS — Z3A18 18 weeks gestation of pregnancy: Secondary | ICD-10-CM | POA: Insufficient documentation

## 2017-06-09 DIAGNOSIS — Z7982 Long term (current) use of aspirin: Secondary | ICD-10-CM | POA: Diagnosis not present

## 2017-06-09 DIAGNOSIS — Z87891 Personal history of nicotine dependence: Secondary | ICD-10-CM | POA: Diagnosis not present

## 2017-06-09 LAB — URINALYSIS, ROUTINE W REFLEX MICROSCOPIC
Bilirubin Urine: NEGATIVE
Glucose, UA: NEGATIVE mg/dL
Hgb urine dipstick: NEGATIVE
Ketones, ur: NEGATIVE mg/dL
Leukocytes, UA: NEGATIVE
Nitrite: NEGATIVE
Protein, ur: NEGATIVE mg/dL
Specific Gravity, Urine: 1.025 (ref 1.005–1.030)
pH: 6 (ref 5.0–8.0)

## 2017-06-09 NOTE — MAU Note (Signed)
Lost some of mucous plug Thursday night. Called doctor Friday and told to come in but just now coming. Alittle groin pain but no bleeding and no further vag d/c

## 2017-06-09 NOTE — MAU Provider Note (Signed)
History     CSN: [id number]  Arrival date and time: 06/09/17 0020   First Provider Initiated Contact with Patient 06/09/17 0054      Chief Complaint  Patient presents with  . Vaginal Discharge   HPI  [name] is a 32 y.o. G2P1001 at 110w1d who presents to MAU today with complaint of groin pain and vaginal discharge. The patient states that she has lower abdominal pain bilaterally worse with ambulation and changes of position. She has not taken anything for pain. She denies vaginal bleeding, but has had a small amount of mucous discharge noted Thursday night, but none since.   OB History    Gravida  2   Para  1   Term  1   Preterm      AB      Living  1     SAB      TAB      Ectopic      Multiple  0   Live Births  1           Past Medical History:  Diagnosis Date  . Hx of chlamydia infection 03/2016  . Hypertension   . Postpartum hypertension 07/09/2016  . Uterine fibroid     Past Surgical History:  Procedure Laterality Date  . NO PAST SURGERIES      Family History  Problem Relation Age of Onset  . Hypertension Mother   . Cancer Father     Social History   Tobacco Use  . Smoking status: Former Smoker    Packs/day: 0.50    Types: Cigarettes    Last attempt to quit: 04/27/2015    Years since quitting: 2.1  . Smokeless tobacco: Never Used  Substance Use Topics  . Alcohol use: Yes    Comment: none since pregnancy  . Drug use: No    Allergies: No Known Allergies  Medications Prior to Admission  Medication Sig Dispense Refill Last Dose  . aspirin EC 81 MG tablet Take 81 mg by mouth daily.   Taking  . Prenatal Vit-Fe Fumarate-FA (PRENATAL MULTIVITAMIN) TABS tablet Take 1 tablet by mouth daily at 12 noon. (Patient taking differently: Take 1 tablet by mouth daily. ) 100 tablet 3 Taking    Review of Systems  Constitutional: Negative for fever.  Gastrointestinal: Positive for abdominal pain. Negative for constipation, diarrhea,  nausea and vomiting.  Genitourinary: Positive for vaginal discharge. Negative for vaginal bleeding.   Physical Exam   Blood pressure 127/76, pulse 86, temperature 98.7 F (37.1 C), resp. rate 18, height 5\' 5"  (1.651 m), weight 232 lb (105.2 kg), last menstrual period 02/02/2017, SpO2 100 %, unknown if currently breastfeeding.  Physical Exam  Nursing note and vitals reviewed. Constitutional: She is oriented to person, place, and time. She appears well-developed and well-nourished. No distress.  HENT:  Head: Normocephalic and atraumatic.  Cardiovascular: Normal rate.  Respiratory: Effort normal.  GI: Soft. She exhibits no distension and no mass. There is no tenderness. There is no rebound and no guarding.  Genitourinary: Uterus is enlarged (appropriate for GA). Uterus is not tender. Cervix exhibits no motion tenderness. No bleeding in the vagina. Vaginal discharge (small mucous discharge) found.  Neurological: She is alert and oriented to person, place, and time.  Skin: Skin is warm and dry. No erythema.  Psychiatric: She has a normal mood and affect.  Dilation: Closed Cervical Position: Posterior Exam by:: J.Wenzle,PA   Results for orders placed or performed during the hospital  encounter of 06/09/17 (from the past 24 hour(s))  Urinalysis, Routine w reflex microscopic     Status: None   Collection Time: 06/09/17 12:35 AM  Result Value Ref Range   Color, Urine YELLOW YELLOW   APPearance CLEAR CLEAR   Specific Gravity, Urine 1.025 1.005 - 1.030   pH 6.0 5.0 - 8.0   Glucose, UA NEGATIVE NEGATIVE mg/dL   Hgb urine dipstick NEGATIVE NEGATIVE   Bilirubin Urine NEGATIVE NEGATIVE   Ketones, ur NEGATIVE NEGATIVE mg/dL   Protein, ur NEGATIVE NEGATIVE mg/dL   Nitrite NEGATIVE NEGATIVE   Leukocytes, UA NEGATIVE NEGATIVE     MAU Course  Procedures None  MDM FHR - 145 bpm with doppler Cervix closed, no bleeding, no pooling.  Patient reassured Assessment and Plan  A: [facility]UP at  [redacted]w[redacted]d Round ligament pain Vaginal discharge in pregnancy, second trimester  P: Discharge home Second trimester precautions discussed Patient advised to try abdominal binder for pain Tylenol PRN also advised Patient advised to follow-up with [facility] as scheduled for routine prenatal care or sooner PRN Patient may return to [facility]U as needed or if her condition were to change or worsen  Kerry Hough, PA-C 06/09/2017, 12:54 AM

## 2017-06-09 NOTE — Discharge Instructions (Signed)

## 2017-06-11 ENCOUNTER — Encounter (HOSPITAL_COMMUNITY): Payer: Self-pay | Admitting: Nurse Practitioner

## 2017-06-14 ENCOUNTER — Ambulatory Visit (INDEPENDENT_AMBULATORY_CARE_PROVIDER_SITE_OTHER): Payer: 59 | Admitting: Student

## 2017-06-14 VITALS — BP 127/65 | HR 82 | Wt 232.0 lb

## 2017-06-14 DIAGNOSIS — O10919 Unspecified pre-existing hypertension complicating pregnancy, unspecified trimester: Secondary | ICD-10-CM

## 2017-06-14 DIAGNOSIS — O0990 Supervision of high risk pregnancy, unspecified, unspecified trimester: Secondary | ICD-10-CM

## 2017-06-14 LAB — POCT URINALYSIS DIP (DEVICE)
Bilirubin Urine: NEGATIVE
Glucose, UA: NEGATIVE mg/dL
Ketones, ur: NEGATIVE mg/dL
Leukocytes, UA: NEGATIVE
Nitrite: NEGATIVE
Protein, ur: NEGATIVE mg/dL
Specific Gravity, Urine: >=1.03 (ref 1.005–1.030)
Urobilinogen, UA: 0.2 mg/dL (ref 0.0–1.0)
pH: 6.5 (ref 5.0–8.0)

## 2017-06-14 NOTE — Patient Instructions (Signed)
Hypertension During Pregnancy °Hypertension, commonly called high blood pressure, is when the force of blood pumping through your arteries is too strong. Arteries are blood vessels that carry blood from the heart throughout the body. Hypertension during pregnancy can cause problems for you and your baby. Your baby may be born early (prematurely) or may not weigh as much as he or she should at birth. Very bad cases of hypertension during pregnancy can be life-threatening. °Different types of hypertension can occur during pregnancy. These include: °· Chronic hypertension. This happens when: °? You have hypertension before pregnancy and it continues during pregnancy. °? You develop hypertension before you are [redacted] weeks pregnant, and it continues during pregnancy. °· Gestational hypertension. This is hypertension that develops after the 20th week of pregnancy. °· Preeclampsia, also called toxemia of pregnancy. This is a very serious type of hypertension that develops only during pregnancy. It affects the whole body, and it can be very dangerous for you and your baby. ° °Gestational hypertension and preeclampsia usually go away within 6 weeks after your baby is born. Women who have hypertension during pregnancy have a greater chance of developing hypertension later in life or during future pregnancies. °What are the causes? °The exact cause of hypertension is not known. °What increases the risk? °There are certain factors that make it more likely for you to develop hypertension during pregnancy. These include: °· Having hypertension during a previous pregnancy or prior to pregnancy. °· Being overweight. °· Being older than age 40. °· Being pregnant for the first time or being pregnant with more than one baby. °· Becoming pregnant using fertilization methods such as IVF (in vitro fertilization). °· Having diabetes, kidney problems, or systemic lupus erythematosus. °· Having a family history of hypertension. ° °What are the  signs or symptoms? °Chronic hypertension and gestational hypertension rarely cause symptoms. Preeclampsia causes symptoms, which may include: °· Increased protein in your urine. Your health care provider will check for this at every visit before you give birth (prenatal visit). °· Severe headaches. °· Sudden weight gain. °· Swelling of the hands, face, legs, and feet. °· Nausea and vomiting. °· Vision problems, such as blurred or double vision. °· Numbness in the face, arms, legs, and feet. °· Dizziness. °· Slurred speech. °· Sensitivity to bright lights. °· Abdominal pain. °· Convulsions. ° °How is this diagnosed? °You may be diagnosed with hypertension during a routine prenatal exam. At each prenatal visit, you may: °· Have a urine test to check for high amounts of protein in your urine. °· Have your blood pressure checked. A blood pressure reading is recorded as two numbers, such as "120 over 80" (or 120/80). The first ("top") number is called the systolic pressure. It is a measure of the pressure in your arteries when your heart beats. The second ("bottom") number is called the diastolic pressure. It is a measure of the pressure in your arteries as your heart relaxes between beats. Blood pressure is measured in a unit called mm Hg. A normal blood pressure reading is: °? Systolic: below 120. °? Diastolic: below 80. ° °The type of hypertension that you are diagnosed with depends on your test results and when your symptoms developed. °· Chronic hypertension is usually diagnosed before 20 weeks of pregnancy. °· Gestational hypertension is usually diagnosed after 20 weeks of pregnancy. °· Hypertension with high amounts of protein in the urine is diagnosed as preeclampsia. °· Blood pressure measurements that stay above 160 systolic, or above 110 diastolic, are   signs of severe preeclampsia. ° °How is this treated? °Treatment for hypertension during pregnancy varies depending on the type of hypertension you have and how  serious it is. °· If you take medicines called ACE inhibitors to treat chronic hypertension, you may need to switch medicines. ACE inhibitors should not be taken during pregnancy. °· If you have gestational hypertension, you may need to take blood pressure medicine. °· If you are at risk for preeclampsia, your health care provider may recommend that you take a low-dose aspirin every day to prevent high blood pressure during your pregnancy. °· If you have severe preeclampsia, you may need to be hospitalized so you and your baby can be monitored closely. You may also need to take medicine (magnesium sulfate) to prevent seizures and to lower blood pressure. This medicine may be given as an injection or through an IV tube. °· In some cases, if your condition gets worse, you may need to deliver your baby early. ° °Follow these instructions at home: °Eating and drinking °· Drink enough fluid to keep your urine clear or pale yellow. °· Eat a healthy diet that is low in salt (sodium). Do not add salt to your food. Check food labels to see how much sodium a food or beverage contains. °Lifestyle °· Do not use any products that contain nicotine or tobacco, such as cigarettes and e-cigarettes. If you need help quitting, ask your health care provider. °· Do not use alcohol. °· Avoid caffeine. °· Avoid stress as much as possible. Rest and get plenty of sleep. °General instructions °· Take over-the-counter and prescription medicines only as told by your health care provider. °· While lying down, lie on your left side. This keeps pressure off your baby. °· While sitting or lying down, raise (elevate) your feet. Try putting some pillows under your lower legs. °· Exercise regularly. Ask your health care provider what kinds of exercise are best for you. °· Keep all prenatal and follow-up visits as told by your health care provider. This is important. °Contact a health care provider if: °· You have symptoms that your health care  provider told you may require more treatment or monitoring, such as: °? Fever. °? Vomiting. °? Headache. °Get help right away if: °· You have severe abdominal pain or vomiting that does not get better with treatment. °· You suddenly develop swelling in your hands, ankles, or face. °· You gain 4 lbs (1.8 kg) or more in 1 week. °· You develop vaginal bleeding, or you have blood in your urine. °· You do not feel your baby moving as much as usual. °· You have blurred or double vision. °· You have muscle twitching or sudden tightening (spasms). °· You have shortness of breath. °· Your lips or fingernails turn blue. °This information is not intended to replace advice given to you by your health care provider. Make sure you discuss any questions you have with your health care provider. °Document Released: 11/22/2010 Document Revised: 09/24/2015 Document Reviewed: 08/20/2015 °Elsevier Interactive Patient Education © 2018 [employer] ° °

## 2017-06-14 NOTE — Progress Notes (Signed)
   PRENATAL VISIT NOTE  Subjective:  Anita Potter is a 32 y.o. G2P1001 at [redacted]w[redacted]d being seen today for ongoing prenatal care.  She is currently monitored for the following issues for this high-risk pregnancy and has Supervision of high risk pregnancy, antepartum; Vertigo; Chronic hypertension during pregnancy, antepartum; Obesity (BMI 35.0-39.9 without comorbidity); and Short interval between pregnancies affecting pregnancy, antepartum on their problem list.  Patient reports no complaints.  Contractions: Not present. Vag. Bleeding: None.  Movement: Present. Denies leaking of fluid.  Was seen in MAU last week for round ligament pain. States symptoms have improved. Does have maternity support belt at home & will start using it during the day.   The following portions of the patient's history were reviewed and updated as appropriate: allergies, current medications, past family history, past medical history, past social history, past surgical history and problem list. Problem list updated.  Objective:   Vitals:   06/14/17 0937  BP: 127/65  Pulse: 82  Weight: 232 lb (105.2 kg)    Fetal Status: Fetal Heart Rate (bpm): 140   Movement: Present   FH 2 FB below umbilicus  General:  Alert, oriented and cooperative. Patient is in no acute distress.  Skin: Skin is warm and dry. No rash noted.   Cardiovascular: Normal heart rate noted  Respiratory: Normal respiratory effort, no problems with respiration noted  Abdomen: Soft, gravid, appropriate for gestational age.  Pain/Pressure: Present     Pelvic: Cervical exam deferred        Extremities: Normal range of motion.  Edema: None  Mental Status:  Normal mood and affect. Normal behavior. Normal judgment and thought content.   Assessment and Plan:  Pregnancy: G2P1001 at [redacted]w[redacted]d  1. Chronic hypertension during pregnancy, antepartum -taking daily baby ASA -Reviewed s/s to report & go to MAU  2. Supervision of high risk pregnancy,  antepartum -anatomy scan scheduled for tomorrow -Had u/s at [facility] last week, it's a boy!  Preterm labor symptoms and general obstetric precautions including but not limited to vaginal bleeding, contractions, leaking of fluid and fetal movement were reviewed in detail with the patient. Please refer to After Visit Summary for other counseling recommendations.  Return in about 1 month (around 07/12/2017) for High Risk OB.   Jorje Guild, NP

## 2017-06-15 ENCOUNTER — Ambulatory Visit (HOSPITAL_COMMUNITY): Admission: RE | Admit: 2017-06-15 | Payer: 59 | Source: Ambulatory Visit

## 2017-06-25 ENCOUNTER — Other Ambulatory Visit: Payer: Self-pay | Admitting: Nurse Practitioner

## 2017-06-25 ENCOUNTER — Ambulatory Visit (HOSPITAL_COMMUNITY)
Admission: RE | Admit: 2017-06-25 | Discharge: 2017-06-25 | Disposition: A | Discharge status: Home / Self Care | Payer: 59 | Source: Ambulatory Visit | Attending: Nurse Practitioner | Admitting: Nurse Practitioner

## 2017-06-25 ENCOUNTER — Encounter (HOSPITAL_COMMUNITY): Payer: Self-pay

## 2017-06-25 DIAGNOSIS — O10919 Unspecified pre-existing hypertension complicating pregnancy, unspecified trimester: Secondary | ICD-10-CM

## 2017-06-25 DIAGNOSIS — Z3A20 20 weeks gestation of pregnancy: Secondary | ICD-10-CM | POA: Insufficient documentation

## 2017-06-25 DIAGNOSIS — O99212 Obesity complicating pregnancy, second trimester: Secondary | ICD-10-CM | POA: Insufficient documentation

## 2017-06-25 DIAGNOSIS — Z3689 Encounter for other specified antenatal screening: Secondary | ICD-10-CM

## 2017-06-25 DIAGNOSIS — O10012 Pre-existing essential hypertension complicating pregnancy, second trimester: Secondary | ICD-10-CM | POA: Diagnosis not present

## 2017-06-25 DIAGNOSIS — O0990 Supervision of high risk pregnancy, unspecified, unspecified trimester: Secondary | ICD-10-CM

## 2017-06-26 ENCOUNTER — Other Ambulatory Visit (HOSPITAL_COMMUNITY): Payer: Self-pay | Admitting: *Deleted

## 2017-06-26 DIAGNOSIS — O10919 Unspecified pre-existing hypertension complicating pregnancy, unspecified trimester: Secondary | ICD-10-CM

## 2017-07-10 ENCOUNTER — Ambulatory Visit (INDEPENDENT_AMBULATORY_CARE_PROVIDER_SITE_OTHER): Payer: 59 | Admitting: Obstetrics and Gynecology

## 2017-07-10 VITALS — BP 125/68 | HR 83 | Wt 235.9 lb

## 2017-07-10 DIAGNOSIS — O0990 Supervision of high risk pregnancy, unspecified, unspecified trimester: Secondary | ICD-10-CM

## 2017-07-10 DIAGNOSIS — O10919 Unspecified pre-existing hypertension complicating pregnancy, unspecified trimester: Secondary | ICD-10-CM

## 2017-07-10 NOTE — Progress Notes (Signed)
Subjective:  Anita Potter is a 32 y.o. G2P1001 at [redacted]w[redacted]d being seen today for ongoing prenatal care.  She is currently monitored for the following issues for this high-risk pregnancy and has Supervision of high risk pregnancy, antepartum; Vertigo; Chronic hypertension during pregnancy, antepartum; Obesity (BMI 35.0-39.9 without comorbidity); and Short interval between pregnancies affecting pregnancy, antepartum on their problem list.  Patient reports no complaints.  Contractions: Not present. Vag. Bleeding: None.  Movement: Present. Denies leaking of fluid.   The following portions of the patient's history were reviewed and updated as appropriate: allergies, current medications, past family history, past medical history, past social history, past surgical history and problem list. Problem list updated.  Objective:   Vitals:   07/10/17 0932  BP: 125/68  Pulse: 83  Weight: 107 kg (235 lb 14.4 oz)    Fetal Status: Fetal Heart Rate (bpm): 140 Fundal Height: 23 cm Movement: Present     General:  Alert, oriented and cooperative. Patient is in no acute distress.  Skin: Skin is warm and dry. No rash noted.   Cardiovascular: Normal heart rate noted  Respiratory: Normal respiratory effort, no problems with respiration noted  Abdomen: Soft, gravid, appropriate for gestational age. Pain/Pressure: Absent     Pelvic: Vag. Bleeding: None     Cervical exam deferred        Extremities: Normal range of motion.  Edema: None  Mental Status: Normal mood and affect. Normal behavior. Normal judgment and thought content.   Urinalysis:      Assessment and Plan:  Pregnancy: G2P1001 at [redacted]w[redacted]d  1. Supervision of high risk pregnancy, antepartum Doing well.   2. Chronic hypertension during pregnancy, antepartum Continue ASA. Has 4 week f/u US ordered. Denies PIH symptoms; reviewed what symptoms to look out for.   Preterm labor symptoms and general obstetric precautions including but not limited to vaginal  bleeding, contractions, leaking of fluid and fetal movement were reviewed in detail with the patient. Please refer to After Visit Summary for other counseling recommendations.  Return in about 1 month (around 08/07/2017) for ob visit.   Katheren Shams, DO

## 2017-07-10 NOTE — Patient Instructions (Signed)

## 2017-07-23 ENCOUNTER — Ambulatory Visit (HOSPITAL_COMMUNITY)
Admission: RE | Admit: 2017-07-23 | Discharge: 2017-07-23 | Disposition: A | Discharge status: Home / Self Care | Payer: 59 | Source: Ambulatory Visit | Attending: Nurse Practitioner | Admitting: Nurse Practitioner

## 2017-07-23 ENCOUNTER — Encounter (HOSPITAL_COMMUNITY): Payer: Self-pay

## 2017-07-23 DIAGNOSIS — Z362 Encounter for other antenatal screening follow-up: Secondary | ICD-10-CM | POA: Insufficient documentation

## 2017-07-23 DIAGNOSIS — Z3A24 24 weeks gestation of pregnancy: Secondary | ICD-10-CM | POA: Insufficient documentation

## 2017-07-23 DIAGNOSIS — O10919 Unspecified pre-existing hypertension complicating pregnancy, unspecified trimester: Secondary | ICD-10-CM

## 2017-07-23 DIAGNOSIS — O10012 Pre-existing essential hypertension complicating pregnancy, second trimester: Secondary | ICD-10-CM | POA: Diagnosis not present

## 2017-07-23 DIAGNOSIS — O99212 Obesity complicating pregnancy, second trimester: Secondary | ICD-10-CM | POA: Diagnosis not present

## 2017-08-08 ENCOUNTER — Ambulatory Visit (INDEPENDENT_AMBULATORY_CARE_PROVIDER_SITE_OTHER): Payer: 59 | Admitting: Family Medicine

## 2017-08-08 VITALS — BP 130/75 | HR 73 | Wt 237.1 lb

## 2017-08-08 DIAGNOSIS — O0990 Supervision of high risk pregnancy, unspecified, unspecified trimester: Secondary | ICD-10-CM

## 2017-08-08 DIAGNOSIS — O10919 Unspecified pre-existing hypertension complicating pregnancy, unspecified trimester: Secondary | ICD-10-CM

## 2017-08-08 DIAGNOSIS — O0992 Supervision of high risk pregnancy, unspecified, second trimester: Secondary | ICD-10-CM

## 2017-08-08 DIAGNOSIS — O10912 Unspecified pre-existing hypertension complicating pregnancy, second trimester: Secondary | ICD-10-CM

## 2017-08-08 LAB — POCT URINALYSIS DIP (DEVICE)
Bilirubin Urine: NEGATIVE
Glucose, UA: NEGATIVE mg/dL
Hgb urine dipstick: NEGATIVE
Ketones, ur: NEGATIVE mg/dL
Leukocytes, UA: NEGATIVE
Nitrite: NEGATIVE
Protein, ur: NEGATIVE mg/dL
Specific Gravity, Urine: 1.02 (ref 1.005–1.030)
Urobilinogen, UA: 0.2 mg/dL (ref 0.0–1.0)
pH: 7 (ref 5.0–8.0)

## 2017-08-08 NOTE — Patient Instructions (Addendum)
 Breastfeeding Choosing to breastfeed is one of the best decisions you can make for yourself and your baby. A change in hormones during pregnancy causes your breasts to make breast milk in your milk-producing glands. Hormones prevent breast milk from being released before your baby is born. They also prompt milk flow after birth. Once breastfeeding has begun, thoughts of your baby, as well as his or her sucking or crying, can stimulate the release of milk from your milk-producing glands. Benefits of breastfeeding Research shows that breastfeeding offers many health benefits for infants and mothers. It also offers a cost-free and convenient way to feed your baby. For your baby  Your first milk (colostrum) helps your baby's digestive system to function better.  Special cells in your milk (antibodies) help your baby to fight off infections.  Breastfed babies are less likely to develop asthma, allergies, obesity, or type 2 diabetes. They are also at lower risk for sudden infant death syndrome (SIDS).  Nutrients in breast milk are better able to meet your baby's needs compared to infant formula.  Breast milk improves your baby's brain development. For you  Breastfeeding helps to create a very special bond between you and your baby.  Breastfeeding is convenient. Breast milk costs nothing and is always available at the correct temperature.  Breastfeeding helps to burn calories. It helps you to lose the weight that you gained during pregnancy.  Breastfeeding makes your uterus return faster to its size before pregnancy. It also slows bleeding (lochia) after you give birth.  Breastfeeding helps to lower your risk of developing type 2 diabetes, osteoporosis, rheumatoid arthritis, cardiovascular disease, and breast, ovarian, uterine, and endometrial cancer later in life. Breastfeeding basics Starting breastfeeding  Find a comfortable place to sit or lie down, with your neck and back  well-supported.  Place a pillow or a rolled-up blanket under your baby to bring him or her to the level of your breast (if you are seated). Nursing pillows are specially designed to help support your arms and your baby while you breastfeed.  Make sure that your baby's tummy (abdomen) is facing your abdomen.  Gently massage your breast. With your fingertips, massage from the outer edges of your breast inward toward the nipple. This encourages milk flow. If your milk flows slowly, you may need to continue this action during the feeding.  Support your breast with 4 fingers underneath and your thumb above your nipple (make the letter "C" with your hand). Make sure your fingers are well away from your nipple and your baby's mouth.  Stroke your baby's lips gently with your finger or nipple.  When your baby's mouth is open wide enough, quickly bring your baby to your breast, placing your entire nipple and as much of the areola as possible into your baby's mouth. The areola is the colored area around your nipple. ? More areola should be visible above your baby's upper lip than below the lower lip. ? Your baby's lips should be opened and extended outward (flanged) to ensure an adequate, comfortable latch. ? Your baby's tongue should be between his or her lower gum and your breast.  Make sure that your baby's mouth is correctly positioned around your nipple (latched). Your baby's lips should create a seal on your breast and be turned out (everted).  It is common for your baby to suck about 2-3 minutes in order to start the flow of breast milk. Latching Teaching your baby how to latch onto your breast properly is   very important. An improper latch can cause nipple pain, decreased milk supply, and poor weight gain in your baby. Also, if your baby is not latched onto your nipple properly, he or she may swallow some air during feeding. This can make your baby fussy. Burping your baby when you switch breasts  during the feeding can help to get rid of the air. However, teaching your baby to latch on properly is still the best way to prevent fussiness from swallowing air while breastfeeding. Signs that your baby has successfully latched onto your nipple  Silent tugging or silent sucking, without causing you pain. Infant's lips should be extended outward (flanged).  Swallowing heard between every 3-4 sucks once your milk has started to flow (after your let-down milk reflex occurs).  Muscle movement above and in front of his or her ears while sucking.  Signs that your baby has not successfully latched onto your nipple  Sucking sounds or smacking sounds from your baby while breastfeeding.  Nipple pain.  If you think your baby has not latched on correctly, slip your finger into the corner of your baby's mouth to break the suction and place it between your baby's gums. Attempt to start breastfeeding again. Signs of successful breastfeeding Signs from your baby  Your baby will gradually decrease the number of sucks or will completely stop sucking.  Your baby will fall asleep.  Your baby's body will relax.  Your baby will retain a small amount of milk in his or her mouth.  Your baby will let go of your breast by himself or herself.  Signs from you  Breasts that have increased in firmness, weight, and size 1-3 hours after feeding.  Breasts that are softer immediately after breastfeeding.  Increased milk volume, as well as a change in milk consistency and color by the fifth day of breastfeeding.  Nipples that are not sore, cracked, or bleeding.  Signs that your baby is getting enough milk  Wetting at least 1-2 diapers during the first 24 hours after birth.  Wetting at least 5-6 diapers every 24 hours for the first week after birth. The urine should be clear or pale yellow by the age of 5 days.  Wetting 6-8 diapers every 24 hours as your baby continues to grow and develop.  At least 3  stools in a 24-hour period by the age of 5 days. The stool should be soft and yellow.  At least 3 stools in a 24-hour period by the age of 7 days. The stool should be seedy and yellow.  No loss of weight greater than 10% of birth weight during the first 3 days of life.  Average weight gain of 4-7 oz (113-198 g) per week after the age of 4 days.  Consistent daily weight gain by the age of 5 days, without weight loss after the age of 2 weeks. After a feeding, your baby may spit up a small amount of milk. This is normal. Breastfeeding frequency and duration Frequent feeding will help you make more milk and can prevent sore nipples and extremely full breasts (breast engorgement). Breastfeed when you feel the need to reduce the fullness of your breasts or when your baby shows signs of hunger. This is called "breastfeeding on demand." Signs that your baby is hungry include:  Increased alertness, activity, or restlessness.  Movement of the head from side to side.  Opening of the mouth when the corner of the mouth or cheek is stroked (rooting).  Increased sucking   sounds, smacking lips, cooing, sighing, or squeaking.  Hand-to-mouth movements and sucking on fingers or hands.  Fussing or crying.  Avoid introducing a pacifier to your baby in the first 4-6 weeks after your baby is born. After this time, you may choose to use a pacifier. Research has shown that pacifier use during the first year of a baby's life decreases the risk of sudden infant death syndrome (SIDS). Allow your baby to feed on each breast as long as he or she wants. When your baby unlatches or falls asleep while feeding from the first breast, offer the second breast. Because newborns are often sleepy in the first few weeks of life, you may need to awaken your baby to get him or her to feed. Breastfeeding times will vary from baby to baby. However, the following rules can serve as a guide to help you make sure that your baby is  properly fed:  Newborns (babies 4 weeks of age or younger) may breastfeed every 1-3 hours.  Newborns should not go without breastfeeding for longer than 3 hours during the day or 5 hours during the night.  You should breastfeed your baby a minimum of 8 times in a 24-hour period.  Breast milk pumping Pumping and storing breast milk allows you to make sure that your baby is exclusively fed your breast milk, even at times when you are unable to breastfeed. This is especially important if you go back to work while you are still breastfeeding, or if you are not able to be present during feedings. Your lactation consultant can help you find a method of pumping that works best for you and give you guidelines about how long it is safe to store breast milk. Caring for your breasts while you breastfeed Nipples can become dry, cracked, and sore while breastfeeding. The following recommendations can help keep your breasts moisturized and healthy:  Avoid using soap on your nipples.  Wear a supportive bra designed especially for nursing. Avoid wearing underwire-style bras or extremely tight bras (sports bras).  Air-dry your nipples for 3-4 minutes after each feeding.  Use only cotton bra pads to absorb leaked breast milk. Leaking of breast milk between feedings is normal.  Use lanolin on your nipples after breastfeeding. Lanolin helps to maintain your skin's normal moisture barrier. Pure lanolin is not harmful (not toxic) to your baby. You may also hand express a few drops of breast milk and gently massage that milk into your nipples and allow the milk to air-dry.  In the first few weeks after giving birth, some women experience breast engorgement. Engorgement can make your breasts feel heavy, warm, and tender to the touch. Engorgement peaks within 3-5 days after you give birth. The following recommendations can help to ease engorgement:  Completely empty your breasts while breastfeeding or pumping. You  may want to start by applying warm, moist heat (in the shower or with warm, water-soaked hand towels) just before feeding or pumping. This increases circulation and helps the milk flow. If your baby does not completely empty your breasts while breastfeeding, pump any extra milk after he or she is finished.  Apply ice packs to your breasts immediately after breastfeeding or pumping, unless this is too uncomfortable for you. To do this: ? Put ice in a plastic bag. ? Place a towel between your skin and the bag. ? Leave the ice on for 20 minutes, 2-3 times a day.  Make sure that your baby is latched on and positioned properly   while breastfeeding.  If engorgement persists after 48 hours of following these recommendations, contact your health care provider or a Science writer. Overall health care recommendations while breastfeeding  Eat 3 healthy meals and 3 snacks every day. Well-nourished mothers who are breastfeeding need an additional 450-500 calories a day. You can meet this requirement by increasing the amount of a balanced diet that you eat.  Drink enough water to keep your urine pale yellow or clear.  Rest often, relax, and continue to take your prenatal vitamins to prevent fatigue, stress, and low vitamin and mineral levels in your body (nutrient deficiencies).  Do not use any products that contain nicotine or tobacco, such as cigarettes and e-cigarettes. Your baby may be harmed by chemicals from cigarettes that pass into breast milk and exposure to secondhand smoke. If you need help quitting, ask your health care provider.  Avoid alcohol.  Do not use illegal drugs or marijuana.  Talk with your health care provider before taking any medicines. These include over-the-counter and prescription medicines as well as vitamins and herbal supplements. Some medicines that may be harmful to your baby can pass through breast milk.  It is possible to become pregnant while breastfeeding. If  birth control is desired, ask your health care provider about options that will be safe while breastfeeding your baby. Where to find more information: [employer]: www.llli.org Contact a health care provider if:  You feel like you want to stop breastfeeding or have become frustrated with breastfeeding.  Your nipples are cracked or bleeding.  Your breasts are red, tender, or warm.  You have: ? Painful breasts or nipples. ? A swollen area on either breast. ? A fever or chills. ? Nausea or vomiting. ? Drainage other than breast milk from your nipples.  Your breasts do not become full before feedings by the fifth day after you give birth.  You feel sad and depressed.  Your baby is: ? Too sleepy to eat well. ? Having trouble sleeping. ? More than 21 week old and wetting fewer than 6 diapers in a 24-hour period. ? Not gaining weight by 90 days of age.  Your baby has fewer than 3 stools in a 24-hour period.  Your baby's skin or the white parts of his or her eyes become yellow. Get help right away if:  Your baby is overly tired (lethargic) and does not want to wake up and feed.  Your baby develops an unexplained fever. Summary  Breastfeeding offers many health benefits for infant and mothers.  Try to breastfeed your infant when he or she shows early signs of hunger.  Gently tickle or stroke your baby's lips with your finger or nipple to allow the baby to open his or her mouth. Bring the baby to your breast. Make sure that much of the areola is in your baby's mouth. Offer one side and burp the baby before you offer the other side.  Talk with your health care provider or lactation consultant if you have questions or you face problems as you breastfeed. This information is not intended to replace advice given to you by your health care provider. Make sure you discuss any questions you have with your health care provider. Document Released: 03/06/2005 Document  Revised: 04/07/2016 Document Reviewed: 04/07/2016 Elsevier Interactive Patient Education  2018 [place] to have your son circumcised:    [facility] [phone] $480 while you are in hospital  [facility] [phone] $244 by 4 wks  [facility] [phone] $175 by 2 wks  [facility] [phone] $250 by 7 days [facility] [phone] $269 by 4 wks  These prices sometimes change but are roughly what you can expect to pay. Please call and confirm pricing.   Circumcision is considered an elective/non-medically necessary procedure. There are many reasons parents decide to have their sons circumsized. During the first year of life circumcised males have a reduced risk of urinary tract infections but after this year the rates between circumcised males and uncircumcised males are the same.  It is safe to have your son circumcised outside of the hospital and the places above perform them regularly.   Deciding about Circumcision in Baby Boys  (Up-to-date The Basics)  What is circumcision?  Circumcision is a surgery that removes the skin that covers the tip of the penis, called the "foreskin" Circumcision is usually done when a boy is between 65 and 17 days old. In the [place], circumcision is common. In some other countries, fewer boys are circumcised. Circumcision is a common tradition in some religions.  Should I have my baby boy circumcised?  There is no easy answer. Circumcision has some benefits. But it also has risks. After talking with your doctor, you will have to decide for yourself what is right for your family.  What are the benefits of circumcision?  Circumcised boys seem to have slightly lower rates of: ?Urinary tract infections ?Swelling of the opening at the tip of the penis Circumcised men seem to have  slightly lower rates of: ?Urinary tract infections ?Swelling of the opening at the tip of the penis ?Penis cancer ?HIV and other infections that you catch during sex ?Cervical cancer in the women they have sex with Even so, in the [place], the risks of these problems are small - even in boys and men who have not been circumcised. Plus, boys and men who are not circumcised can reduce these extra risks by: ?Cleaning their penis well ?Using condoms during sex  What are the risks of circumcision?  Risks include: ?Bleeding or infection from the surgery ?Damage to or amputation of the penis ?A chance that the doctor will cut off too much or not enough of the foreskin ?A chance that sex won't feel as good later in life Only about 1 out of every 200 circumcisions leads to problems. There is also a chance that your health insurance won't pay for circumcision.  How is circumcision done in baby boys?  First, the baby gets medicine for pain relief. This might be a cream on the skin or a shot into the base of the penis. Next, the doctor cleans the baby's penis well. Then he or she uses special tools to cut off the foreskin. Finally, the doctor wraps a bandage (called gauze) around the baby's penis. If you have your baby circumcised, his doctor or nurse will give you instructions on how to care for him after the surgery. It is important that you follow those instructions carefully.

## 2017-08-08 NOTE — Progress Notes (Signed)
   PRENATAL VISIT NOTE  Subjective:  Anita Potter is a 32 y.o. G2P1001 at [redacted]w[redacted]d being seen today for ongoing prenatal care.  She is currently monitored for the following issues for this low-risk pregnancy and has Supervision of high risk pregnancy, antepartum; Vertigo; Chronic hypertension during pregnancy, antepartum; Obesity (BMI 35.0-39.9 without comorbidity); and Short interval between pregnancies affecting pregnancy, antepartum on their problem list.  Patient reports no complaints.  Contractions: Not present. Vag. Bleeding: None.  Movement: Present. Denies leaking of fluid.   The following portions of the patient's history were reviewed and updated as appropriate: allergies, current medications, past family history, past medical history, past social history, past surgical history and problem list. Problem list updated.  Objective:   Vitals:   08/08/17 0827  BP: 130/75  Pulse: 73  Weight: 237 lb 1.6 oz (107.5 kg)    Fetal Status: Fetal Heart Rate (bpm): 154 Fundal Height: 27 cm Movement: Present     General:  Alert, oriented and cooperative. Patient is in no acute distress.  Skin: Skin is warm and dry. No rash noted.   Cardiovascular: Normal heart rate noted  Respiratory: Normal respiratory effort, no problems with respiration noted  Abdomen: Soft, gravid, appropriate for gestational age.  Pain/Pressure: Absent     Pelvic: Cervical exam deferred        Extremities: Normal range of motion.  Edema: None  Mental Status: Normal mood and affect. Normal behavior. Normal judgment and thought content.   Assessment and Plan:  Pregnancy: G2P1001 at [redacted]w[redacted]d  1. Chronic hypertension during pregnancy, antepartum BP is well controlled  2. Supervision of high risk pregnancy, antepartum 28 wk labs with jelly beans to be scheduled next week. Declined TDaP  Preterm labor symptoms and general obstetric precautions including but not limited to vaginal bleeding, contractions, leaking of  fluid and fetal movement were reviewed in detail with the patient. Please refer to After Visit Summary for other counseling recommendations.  Return in 2 weeks (on 08/22/2017) for 28 wk labs.  Future Appointments  Date Time Provider [place]  08/20/2017  9:00 AM WH-MFC [place] 3 WH-MFCUS MFC-US    Donnamae Jude, MD

## 2017-08-10 ENCOUNTER — Other Ambulatory Visit: Payer: Self-pay | Admitting: *Deleted

## 2017-08-10 DIAGNOSIS — O0990 Supervision of high risk pregnancy, unspecified, unspecified trimester: Secondary | ICD-10-CM

## 2017-08-14 ENCOUNTER — Other Ambulatory Visit: Payer: 59

## 2017-08-14 DIAGNOSIS — O0990 Supervision of high risk pregnancy, unspecified, unspecified trimester: Secondary | ICD-10-CM

## 2017-08-15 LAB — GLUCOSE TOLERANCE, 2 HOURS W/ 1HR
Glucose, 1 hour: 105 mg/dL (ref 65–179)
Glucose, 2 hour: 120 mg/dL (ref 65–152)
Glucose, Fasting: 77 mg/dL (ref 65–91)

## 2017-08-15 LAB — CBC
Hematocrit: 35.6 % (ref 34.0–46.6)
Hemoglobin: 11.7 g/dL (ref 11.1–15.9)
MCH: 30.2 pg (ref 26.6–33.0)
MCHC: 32.9 g/dL (ref 31.5–35.7)
MCV: 92 fL (ref 79–97)
Platelets: 250 10*3/uL (ref 150–450)
RBC: 3.88 x10E6/uL (ref 3.77–5.28)
RDW: 14.4 % (ref 12.3–15.4)
WBC: 9.1 10*3/uL (ref 3.4–10.8)

## 2017-08-15 LAB — HIV ANTIBODY (ROUTINE TESTING W REFLEX): HIV Screen 4th Generation wRfx: NONREACTIVE

## 2017-08-15 LAB — RPR: RPR Ser Ql: NONREACTIVE

## 2017-08-20 ENCOUNTER — Ambulatory Visit (HOSPITAL_COMMUNITY)
Admission: RE | Admit: 2017-08-20 | Discharge: 2017-08-20 | Disposition: A | Discharge status: Home / Self Care | Payer: 59 | Source: Ambulatory Visit | Attending: Nurse Practitioner | Admitting: Nurse Practitioner

## 2017-08-20 ENCOUNTER — Encounter (HOSPITAL_COMMUNITY): Payer: Self-pay

## 2017-08-20 ENCOUNTER — Other Ambulatory Visit (HOSPITAL_COMMUNITY): Payer: Self-pay | Admitting: *Deleted

## 2017-08-20 DIAGNOSIS — O10919 Unspecified pre-existing hypertension complicating pregnancy, unspecified trimester: Secondary | ICD-10-CM

## 2017-08-20 DIAGNOSIS — Z3A28 28 weeks gestation of pregnancy: Secondary | ICD-10-CM | POA: Insufficient documentation

## 2017-08-20 DIAGNOSIS — O10913 Unspecified pre-existing hypertension complicating pregnancy, third trimester: Secondary | ICD-10-CM | POA: Diagnosis not present

## 2017-08-27 ENCOUNTER — Ambulatory Visit (INDEPENDENT_AMBULATORY_CARE_PROVIDER_SITE_OTHER): Payer: 59 | Admitting: Advanced Practice Midwife

## 2017-08-27 VITALS — BP 124/77 | HR 79 | Wt 239.2 lb

## 2017-08-27 DIAGNOSIS — Z3483 Encounter for supervision of other normal pregnancy, third trimester: Secondary | ICD-10-CM

## 2017-08-27 NOTE — Progress Notes (Signed)
   PRENATAL VISIT NOTE  Subjective:  Anita Potter is a 32 y.o. G2P1001 at [redacted]w[redacted]d being seen today for ongoing prenatal care.  She is currently monitored for the following issues for this low-risk pregnancy and has Supervision of high risk pregnancy, antepartum; Vertigo; Chronic hypertension during pregnancy, antepartum; Obesity (BMI 35.0-39.9 without comorbidity); and Short interval between pregnancies affecting pregnancy, antepartum on their problem list.  Patient reports no complaints.  Contractions: Not present. Vag. Bleeding: None.  Movement: Present. Denies leaking of fluid.   The following portions of the patient's history were reviewed and updated as appropriate: allergies, current medications, past family history, past medical history, past social history, past surgical history and problem list. Problem list updated.  Objective:   Vitals:   08/27/17 1108  BP: 124/77  Pulse: 79  Weight: 239 lb 3.2 oz (108.5 kg)    Fetal Status: Fetal Heart Rate (bpm): 143 Fundal Height: 30 cm Movement: Present     General:  Alert, oriented and cooperative. Patient is in no acute distress.  Skin: Skin is warm and dry. No rash noted.   Cardiovascular: Normal heart rate noted  Respiratory: Normal respiratory effort, no problems with respiration noted  Abdomen: Soft, gravid, appropriate for gestational age.  Pain/Pressure: Absent     Pelvic: Cervical exam deferred        Extremities: Normal range of motion.  Edema: None  Mental Status: Normal mood and affect. Normal behavior. Normal judgment and thought content.   Assessment and Plan:  Pregnancy: G2P1001 at [redacted]w[redacted]d  1. Encounter for supervision of other normal pregnancy in third trimester --Feeling well today, reports eating small frequent meals and sips of water throughout day --Planning circumcision, answered questions regarding scheduling, premedication, aftercare  Preterm labor symptoms and general obstetric precautions including but not  limited to vaginal bleeding, contractions, leaking of fluid and fetal movement were reviewed in detail with the patient. Please refer to After Visit Summary for other counseling recommendations.  Return in about 2 weeks (around 09/10/2017).  Future Appointments  Date Time Provider [place]  09/17/2017  9:00 AM WH-MFC [place] 3 WH-MFCUS MFC-US    Samantha C Weinhold, CNM  08/27/17  11:41 AM

## 2017-08-27 NOTE — Patient Instructions (Signed)

## 2017-09-17 ENCOUNTER — Ambulatory Visit (HOSPITAL_COMMUNITY)
Admission: RE | Admit: 2017-09-17 | Discharge: 2017-09-17 | Disposition: A | Discharge status: Home / Self Care | Payer: 59 | Source: Ambulatory Visit | Attending: Obstetrics and Gynecology | Admitting: Obstetrics and Gynecology

## 2017-09-17 ENCOUNTER — Encounter (HOSPITAL_COMMUNITY): Payer: Self-pay

## 2017-09-17 ENCOUNTER — Other Ambulatory Visit (HOSPITAL_COMMUNITY): Payer: Self-pay | Admitting: Maternal and Fetal Medicine

## 2017-09-17 ENCOUNTER — Other Ambulatory Visit (HOSPITAL_COMMUNITY): Payer: Self-pay | Admitting: *Deleted

## 2017-09-17 DIAGNOSIS — Z362 Encounter for other antenatal screening follow-up: Secondary | ICD-10-CM | POA: Diagnosis not present

## 2017-09-17 DIAGNOSIS — Z3A32 32 weeks gestation of pregnancy: Secondary | ICD-10-CM | POA: Diagnosis not present

## 2017-09-17 DIAGNOSIS — O10013 Pre-existing essential hypertension complicating pregnancy, third trimester: Secondary | ICD-10-CM | POA: Insufficient documentation

## 2017-09-17 DIAGNOSIS — O10919 Unspecified pre-existing hypertension complicating pregnancy, unspecified trimester: Secondary | ICD-10-CM

## 2017-09-17 DIAGNOSIS — E669 Obesity, unspecified: Secondary | ICD-10-CM | POA: Diagnosis not present

## 2017-09-17 DIAGNOSIS — O10913 Unspecified pre-existing hypertension complicating pregnancy, third trimester: Secondary | ICD-10-CM

## 2017-09-17 DIAGNOSIS — Z7982 Long term (current) use of aspirin: Secondary | ICD-10-CM | POA: Diagnosis not present

## 2017-09-17 DIAGNOSIS — O99213 Obesity complicating pregnancy, third trimester: Secondary | ICD-10-CM

## 2017-09-17 DIAGNOSIS — O09893 Supervision of other high risk pregnancies, third trimester: Secondary | ICD-10-CM | POA: Insufficient documentation

## 2017-09-18 ENCOUNTER — Ambulatory Visit (INDEPENDENT_AMBULATORY_CARE_PROVIDER_SITE_OTHER): Payer: 59 | Admitting: Obstetrics & Gynecology

## 2017-09-18 ENCOUNTER — Ambulatory Visit: Payer: Self-pay

## 2017-09-18 VITALS — BP 139/83 | HR 83 | Wt 242.9 lb

## 2017-09-18 DIAGNOSIS — O99213 Obesity complicating pregnancy, third trimester: Secondary | ICD-10-CM

## 2017-09-18 DIAGNOSIS — O10913 Unspecified pre-existing hypertension complicating pregnancy, third trimester: Secondary | ICD-10-CM

## 2017-09-18 DIAGNOSIS — O0990 Supervision of high risk pregnancy, unspecified, unspecified trimester: Secondary | ICD-10-CM

## 2017-09-18 DIAGNOSIS — E669 Obesity, unspecified: Secondary | ICD-10-CM

## 2017-09-18 DIAGNOSIS — O10919 Unspecified pre-existing hypertension complicating pregnancy, unspecified trimester: Secondary | ICD-10-CM

## 2017-09-18 DIAGNOSIS — O09899 Supervision of other high risk pregnancies, unspecified trimester: Secondary | ICD-10-CM

## 2017-09-18 DIAGNOSIS — O0993 Supervision of high risk pregnancy, unspecified, third trimester: Secondary | ICD-10-CM

## 2017-09-18 LAB — POCT URINALYSIS DIP (DEVICE)
Bilirubin Urine: NEGATIVE
Glucose, UA: NEGATIVE mg/dL
Ketones, ur: NEGATIVE mg/dL
Leukocytes, UA: NEGATIVE
Nitrite: NEGATIVE
Protein, ur: NEGATIVE mg/dL
Specific Gravity, Urine: 1.02 (ref 1.005–1.030)
Urobilinogen, UA: 0.2 mg/dL (ref 0.0–1.0)
pH: 6.5 (ref 5.0–8.0)

## 2017-09-18 NOTE — Progress Notes (Signed)
   PRENATAL VISIT NOTE  Subjective:  Anita Potter is a 32 y.o. G2P1001 at [redacted]w[redacted]d being seen today for ongoing prenatal care.  She is currently monitored for the following issues for this high-risk pregnancy and has Supervision of high risk pregnancy, antepartum; Vertigo; Chronic hypertension during pregnancy, antepartum; Obesity (BMI 35.0-39.9 without comorbidity); and Short interval between pregnancies affecting pregnancy, antepartum on their problem list.  Patient reports no complaints.  Contractions: Irregular. Vag. Bleeding: None.  Movement: Present. Denies leaking of fluid.   The following portions of the patient's history were reviewed and updated as appropriate: allergies, current medications, past family history, past medical history, past social history, past surgical history and problem list. Problem list updated.  Objective:   Vitals:   09/18/17 0905  Weight: 242 lb 14.4 oz (110.2 kg)    Fetal Status:     Movement: Present     General:  Alert, oriented and cooperative. Patient is in no acute distress.  Skin: Skin is warm and dry. No rash noted.   Cardiovascular: Normal heart rate noted  Respiratory: Normal respiratory effort, no problems with respiration noted  Abdomen: Soft, gravid, appropriate for gestational age.  Pain/Pressure: Present     Pelvic: Cervical exam performed        Extremities: Normal range of motion.  Edema: None  Mental Status: Normal mood and affect. Normal behavior. Normal judgment and thought content.   Assessment and Plan:  Pregnancy: G2P1001 at [redacted]w[redacted]d  1. Chronic hypertension during pregnancy, antepartum - on baby asa - start weekly BPP today  2. Obesity (BMI 35.0-39.9 without comorbidity)   3. Short interval between pregnancies affecting pregnancy, antepartum   4. Supervision of high risk pregnancy, antepartum   Preterm labor symptoms and general obstetric precautions including but not limited to vaginal bleeding, contractions, leaking  of fluid and fetal movement were reviewed in detail with the patient. Please refer to After Visit Summary for other counseling recommendations.  No follow-ups on file.  Future Appointments  Date Time Provider [place]  10/16/2017  9:30 AM WH-MFC [place] 5 WH-MFCUS MFC-US    Emily Filbert, MD

## 2017-09-19 ENCOUNTER — Telehealth: Payer: Self-pay | Admitting: General Practice

## 2017-09-19 NOTE — Telephone Encounter (Signed)
Patient called and left message on nurse voicemail line stating she was seen yesterday for a prenatal visit. Patient states she saw her BP was in the 130s on her after visit and she is concerned because she had high blood pressure with her last pregnancy about this time. Patient states she had a dizzy spell earlier and is concerned that could be related as well. Patient would like a call back.   Called patient and asked for more information about her dizziness. Patient states she noticed it this morning for the first time at work. 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 Patient reports no other symptoms. Discussed it could have been due to her work environment. Recommended drinking at least 2 liters of water a day and to watch out for other symptoms like headaches or blurry vision. Discussed we will check her BP again at her appt on Tuesday. Discussed if it is borderline again she can inquire about if additional testing is needed. Patient verbalized understanding to all & had no questions.

## 2017-09-25 ENCOUNTER — Ambulatory Visit (INDEPENDENT_AMBULATORY_CARE_PROVIDER_SITE_OTHER): Payer: 59 | Admitting: *Deleted

## 2017-09-25 ENCOUNTER — Ambulatory Visit: Payer: Self-pay

## 2017-09-25 ENCOUNTER — Encounter: Payer: Self-pay | Admitting: *Deleted

## 2017-09-25 VITALS — BP 143/77 | Wt 243.2 lb

## 2017-09-25 DIAGNOSIS — O10919 Unspecified pre-existing hypertension complicating pregnancy, unspecified trimester: Secondary | ICD-10-CM

## 2017-09-25 DIAGNOSIS — O10913 Unspecified pre-existing hypertension complicating pregnancy, third trimester: Secondary | ICD-10-CM

## 2017-09-25 NOTE — Progress Notes (Signed)
Pt informed that the ultrasound is considered a limited OB ultrasound and is not intended to be a complete ultrasound exam.  Patient also informed that the ultrasound is not being completed with the intent of assessing for fetal or placental anomalies or any pelvic abnormalities.  Explained that the purpose of today's ultrasound is to assess for presentation, BPP and amniotic fluid volume.  Patient acknowledges the purpose of the exam and the limitations of the study.    Pt denies H/A or visual disturbances.  She reports that her job now has mandatory overtime and requests note stating she can only work 40 hours - given as requested. [place] for growth on 7/30, BPP added

## 2017-09-28 ENCOUNTER — Encounter (HOSPITAL_COMMUNITY): Payer: Self-pay | Admitting: *Deleted

## 2017-09-28 ENCOUNTER — Inpatient Hospital Stay (HOSPITAL_COMMUNITY)
Admission: AD | Admit: 2017-09-28 | Discharge: 2017-09-28 | Disposition: A | Discharge status: Home / Self Care | Payer: 59 | Source: Ambulatory Visit | Attending: Obstetrics & Gynecology | Admitting: Obstetrics & Gynecology

## 2017-09-28 ENCOUNTER — Other Ambulatory Visit: Payer: Self-pay

## 2017-09-28 DIAGNOSIS — R51 Headache: Secondary | ICD-10-CM | POA: Insufficient documentation

## 2017-09-28 DIAGNOSIS — O26893 Other specified pregnancy related conditions, third trimester: Secondary | ICD-10-CM | POA: Insufficient documentation

## 2017-09-28 DIAGNOSIS — Z8249 Family history of ischemic heart disease and other diseases of the circulatory system: Secondary | ICD-10-CM | POA: Insufficient documentation

## 2017-09-28 DIAGNOSIS — R42 Dizziness and giddiness: Secondary | ICD-10-CM | POA: Diagnosis not present

## 2017-09-28 DIAGNOSIS — Z7982 Long term (current) use of aspirin: Secondary | ICD-10-CM | POA: Insufficient documentation

## 2017-09-28 DIAGNOSIS — Z87891 Personal history of nicotine dependence: Secondary | ICD-10-CM | POA: Diagnosis not present

## 2017-09-28 DIAGNOSIS — Z3A34 34 weeks gestation of pregnancy: Secondary | ICD-10-CM | POA: Diagnosis not present

## 2017-09-28 DIAGNOSIS — R519 Headache, unspecified: Secondary | ICD-10-CM

## 2017-09-28 DIAGNOSIS — Z801 Family history of malignant neoplasm of trachea, bronchus and lung: Secondary | ICD-10-CM | POA: Insufficient documentation

## 2017-09-28 HISTORY — DX: Unspecified infectious disease: B99.9

## 2017-09-28 HISTORY — DX: Unspecified ovarian cyst, unspecified side: N83.209

## 2017-09-28 LAB — COMPREHENSIVE METABOLIC PANEL
ALT: 12 U/L (ref 0–44)
AST: 24 U/L (ref 15–41)
Albumin: 3 g/dL — ABNORMAL LOW (ref 3.5–5.0)
Alkaline Phosphatase: 91 U/L (ref 38–126)
Anion gap: 7 (ref 5–15)
BUN: 7 mg/dL (ref 6–20)
CO2: 23 mmol/L (ref 22–32)
Calcium: 8.6 mg/dL — ABNORMAL LOW (ref 8.9–10.3)
Chloride: 107 mmol/L (ref 98–111)
Creatinine, Ser: 0.46 mg/dL (ref 0.44–1.00)
GFR calc Af Amer: >60 mL/min (ref >60)
GFR calc non Af Amer: >60 mL/min (ref >60)
Glucose, Bld: 80 mg/dL (ref 70–99)
Potassium: 4.1 mmol/L (ref 3.5–5.1)
Sodium: 137 mmol/L (ref 135–145)
Total Bilirubin: 0.4 mg/dL (ref 0.3–1.2)
Total Protein: 6.2 g/dL — ABNORMAL LOW (ref 6.5–8.1)

## 2017-09-28 LAB — URINALYSIS, ROUTINE W REFLEX MICROSCOPIC
Bilirubin Urine: NEGATIVE
Glucose, UA: NEGATIVE mg/dL
Hgb urine dipstick: NEGATIVE
Ketones, ur: NEGATIVE mg/dL
Nitrite: NEGATIVE
Protein, ur: NEGATIVE mg/dL
Specific Gravity, Urine: 1.023 (ref 1.005–1.030)
pH: 6 (ref 5.0–8.0)

## 2017-09-28 LAB — PROTEIN / CREATININE RATIO, URINE
Creatinine, Urine: 184 mg/dL
Protein Creatinine Ratio: 0.07 mg/mg{Cre} (ref 0.00–0.15)
Total Protein, Urine: 13 mg/dL

## 2017-09-28 LAB — CBC
HCT: 33.1 % — ABNORMAL LOW (ref 36.0–46.0)
Hemoglobin: 11.4 g/dL — ABNORMAL LOW (ref 12.0–15.0)
MCH: 30.5 pg (ref 26.0–34.0)
MCHC: 34.4 g/dL (ref 30.0–36.0)
MCV: 88.5 fL (ref 78.0–100.0)
Platelets: 209 10*3/uL (ref 150–400)
RBC: 3.74 MIL/uL — ABNORMAL LOW (ref 3.87–5.11)
RDW: 12.9 % (ref 11.5–15.5)
WBC: 9.6 10*3/uL (ref 4.0–10.5)

## 2017-09-28 MED ORDER — BUTALBITAL-APAP-CAFFEINE 50-325-40 MG PO TABS
2.0000 | ORAL_TABLET | Freq: Once | ORAL | Status: AC
  Administered 2017-09-28: 2 via ORAL
  Filled 2017-09-28: qty 2

## 2017-09-28 NOTE — MAU Provider Note (Signed)
History     CSN: [id number]  Arrival date and time: 09/28/17 6503   First Provider Initiated Contact with Patient 09/28/17 1904      Chief Complaint  Patient presents with  . Headache  . Dizziness  . visual changes   Headache   This is a new problem. Episode onset: around 1600 today  The problem has been unchanged. The pain is located in the bilateral and temporal region. The pain does not radiate. The pain quality is similar to prior headaches. The quality of the pain is described as dull. The pain is at a severity of 5/10. Associated symptoms include dizziness and nausea. Pertinent negatives include no abdominal pain, fever or vomiting. Nothing aggravates the symptoms. She has tried acetaminophen (took tylenol 500mg  around 1600. ) for the symptoms. The treatment provided no relief.  Dizziness  This is a new problem. Episode onset: 4 days ago. The problem occurs intermittently. The problem has been waxing and waning. Associated symptoms include headaches and nausea. Pertinent negatives include no abdominal pain, chills, fever or vomiting. Nothing aggravates the symptoms. She has tried nothing for the symptoms.    OB History    Gravida  2   Para  1   Term  1   Preterm      AB      Living  1     SAB      TAB      Ectopic      Multiple  0   Live Births  1           Past Medical History:  Diagnosis Date  . Hx of chlamydia infection 03/2016  . Hypertension   . Infection    UTI  . Ovarian cyst   . Postpartum hypertension 07/09/2016  . Uterine fibroid     Past Surgical History:  Procedure Laterality Date  . NO PAST SURGERIES      Family History  Problem Relation Age of Onset  . Hypertension Mother   . Cancer Father        lung    Social History   Tobacco Use  . Smoking status: Former Smoker    Packs/day: 0.50    Types: Cigarettes    Last attempt to quit: 04/27/2015    Years since quitting: 2.4  . Smokeless tobacco: Never Used  Substance Use  Topics  . Alcohol use: Yes    Comment: none since pregnancy  . Drug use: No    Allergies: No Known Allergies  Medications Prior to Admission  Medication Sig Dispense Refill Last Dose  . aspirin EC 81 MG tablet Take 81 mg by mouth daily.   Taking  . Prenatal Vit-Fe Fumarate-FA (PRENATAL MULTIVITAMIN) TABS tablet Take 1 tablet by mouth daily at 12 noon. (Patient taking differently: Take 1 tablet by mouth daily. ) 100 tablet 3 Taking    Review of Systems  Constitutional: Negative for chills and fever.  Gastrointestinal: Positive for nausea. Negative for abdominal pain and vomiting.  Genitourinary: Negative for vaginal bleeding and vaginal discharge.  Neurological: Positive for dizziness and headaches.   Physical Exam   Blood pressure (!) 113/59, pulse 92, temperature 98.5 F (36.9 C), temperature source Oral, resp. rate 16, weight 245 lb (111.1 kg), last menstrual period 01/31/2017, SpO2 100 %, unknown if currently breastfeeding.  Physical Exam  Nursing note and vitals reviewed. Constitutional: She is oriented to person, place, and time. She appears well-developed and well-nourished.  HENT:  Head: Normocephalic and  atraumatic.  Cardiovascular: Normal rate.  Respiratory: Effort normal.  GI: Soft. There is no tenderness. There is no rebound.  Neurological: She is alert and oriented to person, place, and time.  Skin: Skin is warm and dry.  Psychiatric: She has a normal mood and affect.    NST:  Baseline: 140 Variability: moderate Accels: 15x15 Decels: none Toco: none   Results for orders placed or performed during the hospital encounter of 09/28/17 (from the past 24 hour(s))  Urinalysis, Routine w reflex microscopic     Status: Abnormal   Collection Time: 09/28/17  6:35 PM  Result Value Ref Range   Color, Urine YELLOW YELLOW   APPearance HAZY (A) CLEAR   Specific Gravity, Urine 1.023 1.005 - 1.030   pH 6.0 5.0 - 8.0   Glucose, UA NEGATIVE NEGATIVE mg/dL   Hgb urine  dipstick NEGATIVE NEGATIVE   Bilirubin Urine NEGATIVE NEGATIVE   Ketones, ur NEGATIVE NEGATIVE mg/dL   Protein, ur NEGATIVE NEGATIVE mg/dL   Nitrite NEGATIVE NEGATIVE   Leukocytes, UA TRACE (A) NEGATIVE   RBC / HPF 0-5 0 - 5 RBC/hpf   WBC, UA 6-10 0 - 5 WBC/hpf   Bacteria, UA FEW (A) NONE SEEN   Squamous Epithelial / LPF 6-10 0 - 5   Mucus PRESENT   Protein / creatinine ratio, urine     Status: None   Collection Time: 09/28/17  6:35 PM  Result Value Ref Range   Creatinine, Urine 184.00 mg/dL   Total Protein, Urine 13 mg/dL   Protein Creatinine Ratio 0.07 0.00 - 0.15 mg/mg[Cre]  CBC     Status: Abnormal   Collection Time: 09/28/17  7:23 PM  Result Value Ref Range   WBC 9.6 4.0 - 10.5 K/uL   RBC 3.74 (L) 3.87 - 5.11 MIL/uL   Hemoglobin 11.4 (L) 12.0 - 15.0 g/dL   HCT 33.1 (L) 36.0 - 46.0 %   MCV 88.5 78.0 - 100.0 fL   MCH 30.5 26.0 - 34.0 pg   MCHC 34.4 30.0 - 36.0 g/dL   RDW 12.9 11.5 - 15.5 %   Platelets 209 150 - 400 K/uL  Comprehensive metabolic panel     Status: Abnormal   Collection Time: 09/28/17  7:23 PM  Result Value Ref Range   Sodium 137 135 - 145 mmol/L   Potassium 4.1 3.5 - 5.1 mmol/L   Chloride 107 98 - 111 mmol/L   CO2 23 22 - 32 mmol/L   Glucose, Bld 80 70 - 99 mg/dL   BUN 7 6 - 20 mg/dL   Creatinine, Ser 0.46 0.44 - 1.00 mg/dL   Calcium 8.6 (L) 8.9 - 10.3 mg/dL   Total Protein 6.2 (L) 6.5 - 8.1 g/dL   Albumin 3.0 (L) 3.5 - 5.0 g/dL   AST 24 15 - 41 U/L   ALT 12 0 - 44 U/L   Alkaline Phosphatase 91 38 - 126 U/L   Total Bilirubin 0.4 0.3 - 1.2 mg/dL   GFR calc non Af Amer >60 >60 mL/min   GFR calc Af Amer >60 >60 mL/min   Anion gap 7 5 - 15   MAU Course  Procedures  MDM Patient has had fioricet for her headache, and reports that it is better. Pre-eclampsia labs are normal.   Assessment and Plan   1. Headache in pregnancy, antepartum, third trimester   2. [redacted] weeks gestation of pregnancy    DC home Comfort measures reviewed  3rd Trimester  precautions  PTL  precautions  Fetal kick counts RX: none  Return to MAU as needed FU with OB as planned  [facility] for [place] Follow up.   Specialty:  Obstetrics and Gynecology Contact information: [place] 09/28/2017, 7:11 PM

## 2017-09-28 NOTE — MAU Note (Signed)
Experiencing a lot of dizziness.  Called her nurse this week, was told to try to take it easy, drink more fluid.  Got off work early today, started seeing spots and was really dizzy.  Has a headache now, started about an hour ago. Denies epigastric pain or increased swelling.

## 2017-09-28 NOTE — Discharge Instructions (Signed)
Third Trimester of Pregnancy The third trimester is from week 28 through week 40 (months 7 through 9). The third trimester is a time when the unborn baby (fetus) is growing rapidly. At the end of the ninth month, the fetus is about 20 inches in length and weighs 6-10 pounds. Body changes during your third trimester Your body will continue to go through many changes during pregnancy. The changes vary from woman to woman. During the third trimester:  Your weight will continue to increase. You can expect to gain 25-35 pounds (11-16 kg) by the end of the pregnancy.  You may begin to get stretch marks on your hips, abdomen, and breasts.  You may urinate more often because the fetus is moving lower into your pelvis and pressing on your bladder.  You may develop or continue to have heartburn. This is caused by increased hormones that slow down muscles in the digestive tract.  You may develop or continue to have constipation because increased hormones slow digestion and cause the muscles that push waste through your intestines to relax.  You may develop hemorrhoids. These are swollen veins (varicose veins) in the rectum that can itch or be painful.  You may develop swollen, bulging veins (varicose veins) in your legs.  You may have increased body aches in the pelvis, back, or thighs. This is due to weight gain and increased hormones that are relaxing your joints.  You may have changes in your hair. These can include thickening of your hair, rapid growth, and changes in texture. Some women also have hair loss during or after pregnancy, or hair that feels dry or thin. Your hair will most likely return to normal after your baby is born.  Your breasts will continue to grow and they will continue to become tender. A yellow fluid (colostrum) may leak from your breasts. This is the first milk you are producing for your baby.  Your belly button may stick out.  You may notice more swelling in your hands,  face, or ankles.  You may have increased tingling or numbness in your hands, arms, and legs. The skin on your belly may also feel numb.  You may feel short of breath because of your expanding uterus.  You may have more problems sleeping. This can be caused by the size of your belly, increased need to urinate, and an increase in your body's metabolism.  You may notice the fetus "dropping," or moving lower in your abdomen (lightening).  You may have increased vaginal discharge.  You may notice your joints feel loose and you may have pain around your pelvic bone.  What to expect at prenatal visits You will have prenatal exams every 2 weeks until week 36. Then you will have weekly prenatal exams. During a routine prenatal visit:  You will be weighed to make sure you and the baby are growing normally.  Your blood pressure will be taken.  Your abdomen will be measured to track your baby's growth.  The fetal heartbeat will be listened to.  Any test results from the previous visit will be discussed.  You may have a cervical check near your due date to see if your cervix has softened or thinned (effaced).  You will be tested for Group B streptococcus. This happens between 35 and 37 weeks.  Your health care provider may ask you:  What your birth plan is.  How you are feeling.  If you are feeling the baby move.  If you have had   any abnormal symptoms, such as leaking fluid, bleeding, severe headaches, or abdominal cramping.  If you are using any tobacco products, including cigarettes, chewing tobacco, and electronic cigarettes.  If you have any questions.  Other tests or screenings that may be performed during your third trimester include:  Blood tests that check for low iron levels (anemia).  Fetal testing to check the health, activity level, and growth of the fetus. Testing is done if you have certain medical conditions or if there are problems during the  pregnancy.  Nonstress test (NST). This test checks the health of your baby to make sure there are no signs of problems, such as the baby not getting enough oxygen. During this test, a belt is placed around your belly. The baby is made to move, and its heart rate is monitored during movement.  What is false labor? False labor is a condition in which you feel small, irregular tightenings of the muscles in the womb (contractions) that usually go away with rest, changing position, or drinking water. These are called Braxton Hicks contractions. Contractions may last for hours, days, or even weeks before true labor sets in. If contractions come at regular intervals, become more frequent, increase in intensity, or become painful, you should see your health care provider. What are the signs of labor?  Abdominal cramps.  Regular contractions that start at 10 minutes apart and become stronger and more frequent with time.  Contractions that start on the top of the uterus and spread down to the lower abdomen and back.  Increased pelvic pressure and dull back pain.  A watery or bloody mucus discharge that comes from the vagina.  Leaking of amniotic fluid. This is also known as your "water breaking." It could be a slow trickle or a gush. Let your health care provider know if it has a color or strange odor. If you have any of these signs, call your health care provider right away, even if it is before your due date. Follow these instructions at home: Medicines  Follow your health care provider's instructions regarding medicine use. Specific medicines may be either safe or unsafe to take during pregnancy.  Take a prenatal vitamin that contains at least 600 micrograms (mcg) of folic acid.  If you develop constipation, try taking a stool softener if your health care provider approves. Eating and drinking  Eat a balanced diet that includes fresh fruits and vegetables, whole grains, good sources of protein  such as meat, eggs, or tofu, and low-fat dairy. Your health care provider will help you determine the amount of weight gain that is right for you.  Avoid raw meat and uncooked cheese. These carry germs that can cause birth defects in the baby.  If you have low calcium intake from food, talk to your health care provider about whether you should take a daily calcium supplement.  Eat four or five small meals rather than three large meals a day.  Limit foods that are high in fat and processed sugars, such as fried and sweet foods.  To prevent constipation: ? Drink enough fluid to keep your urine clear or pale yellow. ? Eat foods that are high in fiber, such as fresh fruits and vegetables, whole grains, and beans. Activity  Exercise only as directed by your health care provider. Most women can continue their usual exercise routine during pregnancy. Try to exercise for 30 minutes at least 5 days a week. Stop exercising if you experience uterine contractions.  Avoid heavy   lifting.  Do not exercise in extreme heat or humidity, or at high altitudes.  Wear low-heel, comfortable shoes.  Practice good posture.  You may continue to have sex unless your health care provider tells you otherwise. Relieving pain and discomfort  Take frequent breaks and rest with your legs elevated if you have leg cramps or low back pain.  Take warm sitz baths to soothe any pain or discomfort caused by hemorrhoids. Use hemorrhoid cream if your health care provider approves.  Wear a good support bra to prevent discomfort from breast tenderness.  If you develop varicose veins: ? Wear support pantyhose or compression stockings as told by your healthcare provider. ? Elevate your feet for 15 minutes, 3-4 times a day. Prenatal care  Write down your questions. Take them to your prenatal visits.  Keep all your prenatal visits as told by your health care provider. This is important. Safety  Wear your seat belt at  all times when driving.  Make a list of emergency phone numbers, including numbers for family, friends, the hospital, and police and fire departments. General instructions  Avoid cat litter boxes and soil used by cats. These carry germs that can cause birth defects in the baby. If you have a cat, ask someone to clean the litter box for you.  Do not travel far distances unless it is absolutely necessary and only with the approval of your health care provider.  Do not use hot tubs, steam rooms, or saunas.  Do not drink alcohol.  Do not use any products that contain nicotine or tobacco, such as cigarettes and e-cigarettes. If you need help quitting, ask your health care provider.  Do not use any medicinal herbs or unprescribed drugs. These chemicals affect the formation and growth of the baby.  Do not douche or use tampons or scented sanitary pads.  Do not cross your legs for long periods of time.  To prepare for the arrival of your baby: ? Take prenatal classes to understand, practice, and ask questions about labor and delivery. ? Make a trial run to the hospital. ? Visit the hospital and tour the maternity area. ? Arrange for maternity or paternity leave through employers. ? Arrange for family and friends to take care of pets while you are in the hospital. ? Purchase a rear-facing car seat and make sure you know how to install it in your car. ? Pack your hospital bag. ? Prepare the baby's nursery. Make sure to remove all pillows and stuffed animals from the baby's crib to prevent suffocation.  Visit your dentist if you have not gone during your pregnancy. Use a soft toothbrush to brush your teeth and be gentle when you floss. Contact a health care provider if:  You are unsure if you are in labor or if your water has broken.  You become dizzy.  You have mild pelvic cramps, pelvic pressure, or nagging pain in your abdominal area.  You have lower back pain.  You have persistent  nausea, vomiting, or diarrhea.  You have an unusual or bad smelling vaginal discharge.  You have pain when you urinate. Get help right away if:  Your water breaks before 37 weeks.  You have regular contractions less than 5 minutes apart before 37 weeks.  You have a fever.  You are leaking fluid from your vagina.  You have spotting or bleeding from your vagina.  You have severe abdominal pain or cramping.  You have rapid weight loss or weight gain.    You have shortness of breath with chest pain.  You notice sudden or extreme swelling of your face, hands, ankles, feet, or legs.  Your baby makes fewer than 10 movements in 2 hours.  You have severe headaches that do not go away when you take medicine.  You have vision changes. Summary  The third trimester is from week 28 through week 40, months 7 through 9. The third trimester is a time when the unborn baby (fetus) is growing rapidly.  During the third trimester, your discomfort may increase as you and your baby continue to gain weight. You may have abdominal, leg, and back pain, sleeping problems, and an increased need to urinate.  During the third trimester your breasts will keep growing and they will continue to become tender. A yellow fluid (colostrum) may leak from your breasts. This is the first milk you are producing for your baby.  False labor is a condition in which you feel small, irregular tightenings of the muscles in the womb (contractions) that eventually go away. These are called Braxton Hicks contractions. Contractions may last for hours, days, or even weeks before true labor sets in.  Signs of labor can include: abdominal cramps; regular contractions that start at 10 minutes apart and become stronger and more frequent with time; watery or bloody mucus discharge that comes from the vagina; increased pelvic pressure and dull back pain; and leaking of amniotic fluid. This information is not intended to replace advice  given to you by your health care provider. Make sure you discuss any questions you have with your health care provider. Document Released: 02/28/2001 Document Revised: 08/12/2015 Document Reviewed: 05/07/2012 Elsevier Interactive Patient Education  2017 [employer]  

## 2017-10-01 ENCOUNTER — Telehealth: Payer: Self-pay

## 2017-10-01 NOTE — Telephone Encounter (Signed)
Received call from Pt on Nurse Line stating that she was having a lot of swelling in hands, ankles & right foot. Pt was in MAU on  Friday for dizziness & Headaches . Pt was released, no sign of Preeclampsia. Pt stated swelling started Saturday but has went down some. Advised if gets worse & headaches come back to please go back to MAU. And we will see her at her next appt. On 10/03/17. Pt verbalized understanding.

## 2017-10-03 ENCOUNTER — Encounter: Payer: Self-pay | Admitting: Family Medicine

## 2017-10-03 ENCOUNTER — Ambulatory Visit (INDEPENDENT_AMBULATORY_CARE_PROVIDER_SITE_OTHER): Payer: 59 | Admitting: Obstetrics and Gynecology

## 2017-10-03 ENCOUNTER — Ambulatory Visit: Payer: Self-pay

## 2017-10-03 ENCOUNTER — Ambulatory Visit (INDEPENDENT_AMBULATORY_CARE_PROVIDER_SITE_OTHER): Payer: 59 | Admitting: *Deleted

## 2017-10-03 VITALS — BP 126/83 | HR 82 | Wt 244.0 lb

## 2017-10-03 DIAGNOSIS — O10913 Unspecified pre-existing hypertension complicating pregnancy, third trimester: Secondary | ICD-10-CM | POA: Diagnosis not present

## 2017-10-03 DIAGNOSIS — O10919 Unspecified pre-existing hypertension complicating pregnancy, unspecified trimester: Secondary | ICD-10-CM

## 2017-10-03 DIAGNOSIS — O99210 Obesity complicating pregnancy, unspecified trimester: Secondary | ICD-10-CM

## 2017-10-03 DIAGNOSIS — O0990 Supervision of high risk pregnancy, unspecified, unspecified trimester: Secondary | ICD-10-CM

## 2017-10-03 DIAGNOSIS — Z3A35 35 weeks gestation of pregnancy: Secondary | ICD-10-CM

## 2017-10-03 NOTE — Progress Notes (Signed)

## 2017-10-03 NOTE — Progress Notes (Signed)
Prenatal Visit Note Date: 10/03/2017 Clinic: [facility]  Subjective:  Anita Potter is a 32 y.o. G2P1001 at [redacted]w[redacted]d being seen today for ongoing prenatal care.  She is currently monitored for the following issues for this high-risk pregnancy and has Supervision of high risk pregnancy, antepartum; Vertigo; Chronic hypertension during pregnancy, antepartum; Obesity (BMI 35.0-39.9 without comorbidity); Short interval between pregnancies affecting pregnancy, antepartum; and Obesity in pregnancy on their problem list.  Patient reports tired, difficulty sleeping.  Contractions: Irregular. Vag. Bleeding: None.  Movement: Present. Denies leaking of fluid.   The following portions of the patient's history were reviewed and updated as appropriate: allergies, current medications, past family history, past medical history, past social history, past surgical history and problem list. Problem list updated.  Objective:   Vitals:   10/03/17 1034  BP: 126/83  Pulse: 82  Weight: 244 lb (110.7 kg)    Fetal Status: Fetal Heart Rate (bpm): NST   Movement: Present     General:  Alert, oriented and cooperative. Patient is in no acute distress.  Skin: Skin is warm and dry. No rash noted.   Cardiovascular: Normal heart rate noted  Respiratory: Normal respiratory effort, no problems with respiration noted  Abdomen: Soft, gravid, appropriate for gestational age. Pain/Pressure: Present     Pelvic:  Cervical exam deferred        Extremities: Normal range of motion.  Edema: Trace  Mental Status: Normal mood and affect. Normal behavior. Normal judgment and thought content.   Urinalysis:      Assessment and Plan:  Pregnancy: G2P1001 at [redacted]w[redacted]d  1. Supervision of high risk pregnancy, antepartum Routine care. D/w pt re: BC nv. GBS nv Work note written for regular breaks  2. Chronic hypertension during pregnancy, antepartum Only on low dose ASA. 10/10 bpp today. Has rpt growth on 7/30.  Delivery around edc  3. Obesity in pregnancy  Preterm labor symptoms and general obstetric precautions including but not limited to vaginal bleeding, contractions, leaking of fluid and fetal movement were reviewed in detail with the patient. Please refer to After Visit Summary for other counseling recommendations.  Return in about 1 week (around 10/10/2017) for as scheduled.   Aletha Halim, MD

## 2017-10-03 NOTE — Progress Notes (Signed)
Pt reports difficulty sleeping.  [place] for growth /BPP scheduled on 7/30

## 2017-10-07 ENCOUNTER — Inpatient Hospital Stay (HOSPITAL_COMMUNITY)
Admission: AD | Admit: 2017-10-07 | Discharge: 2017-10-07 | Disposition: A | Discharge status: Home / Self Care | Payer: 59 | Source: Ambulatory Visit | Attending: Obstetrics and Gynecology | Admitting: Obstetrics and Gynecology

## 2017-10-07 ENCOUNTER — Inpatient Hospital Stay (HOSPITAL_BASED_OUTPATIENT_CLINIC_OR_DEPARTMENT_OTHER): Payer: 59

## 2017-10-07 ENCOUNTER — Encounter (HOSPITAL_COMMUNITY): Payer: Self-pay | Admitting: *Deleted

## 2017-10-07 DIAGNOSIS — O26893 Other specified pregnancy related conditions, third trimester: Secondary | ICD-10-CM | POA: Diagnosis not present

## 2017-10-07 DIAGNOSIS — R102 Pelvic and perineal pain: Secondary | ICD-10-CM | POA: Insufficient documentation

## 2017-10-07 DIAGNOSIS — O10913 Unspecified pre-existing hypertension complicating pregnancy, third trimester: Secondary | ICD-10-CM | POA: Diagnosis not present

## 2017-10-07 DIAGNOSIS — R109 Unspecified abdominal pain: Secondary | ICD-10-CM | POA: Diagnosis not present

## 2017-10-07 DIAGNOSIS — M549 Dorsalgia, unspecified: Secondary | ICD-10-CM

## 2017-10-07 DIAGNOSIS — Z3A35 35 weeks gestation of pregnancy: Secondary | ICD-10-CM | POA: Diagnosis not present

## 2017-10-07 DIAGNOSIS — O26899 Other specified pregnancy related conditions, unspecified trimester: Secondary | ICD-10-CM

## 2017-10-07 DIAGNOSIS — R1030 Lower abdominal pain, unspecified: Secondary | ICD-10-CM | POA: Diagnosis not present

## 2017-10-07 DIAGNOSIS — O99213 Obesity complicating pregnancy, third trimester: Secondary | ICD-10-CM

## 2017-10-07 DIAGNOSIS — O289 Unspecified abnormal findings on antenatal screening of mother: Secondary | ICD-10-CM

## 2017-10-07 DIAGNOSIS — O9989 Other specified diseases and conditions complicating pregnancy, childbirth and the puerperium: Secondary | ICD-10-CM

## 2017-10-07 DIAGNOSIS — O99891 Other specified diseases and conditions complicating pregnancy: Secondary | ICD-10-CM

## 2017-10-07 DIAGNOSIS — O368130 Decreased fetal movements, third trimester, not applicable or unspecified: Secondary | ICD-10-CM

## 2017-10-07 LAB — URINALYSIS, ROUTINE W REFLEX MICROSCOPIC
Bilirubin Urine: NEGATIVE
Glucose, UA: NEGATIVE mg/dL
Hgb urine dipstick: NEGATIVE
Ketones, ur: NEGATIVE mg/dL
Leukocytes, UA: NEGATIVE
Nitrite: NEGATIVE
Protein, ur: NEGATIVE mg/dL
Specific Gravity, Urine: 1.014 (ref 1.005–1.030)
pH: 7 (ref 5.0–8.0)

## 2017-10-07 NOTE — MAU Note (Addendum)
Anita Potter is a 32 y.o. at [redacted]w[redacted]d here in MAU reporting:  +abdominal pain Cramping and intermittent States has been wearing her belly band  +pelvic pain Pressure/sharp at times, constant  +decreased fetal movement States has felt less movement today compared to normal  Onset of complaint: pain has been ongoing since last OB [name] visit on 7/17 but has gotten worse today Pain score: 10/10 Vitals:   10/07/17 1422  BP: 128/73  Pulse: (!) 102  Resp: 18  Temp: 97.7 F (36.5 C)  SpO2: 100%     FHT:162 Lab orders placed from triage: .[name]

## 2017-10-07 NOTE — MAU Provider Note (Signed)
History     CSN: [id number]  Arrival date and time: 10/07/17 1404   First Provider Initiated Contact with Patient 10/07/17 1427      Chief Complaint  Patient presents with  . Decreased Fetal Movement  . Abdominal Pain  . Pelvic Pain   Anita Potter is a 32 y.o. G2P1001 at [redacted]w[redacted]d who presents today with bilateral lower abdominal pain. She states that she has had this pain off and on for a week or more, but recently it has become a constant pain with intermittent sharp pain. She denies any VB or LOF. She reports decreased fetal movement today, but since being here the baby is "moving all over because he hates the monitors".   Pelvic Pain  The patient's primary symptoms include pelvic pain. The patient's pertinent negatives include no vaginal discharge. This is a new problem. The current episode started in the past 7 days. The problem occurs constantly. The problem has been unchanged. The pain is severe. The problem affects both sides. She is pregnant. Pertinent negatives include no chills, diarrhea, dysuria, fever or vomiting. The vaginal discharge was normal. There has been no bleeding. Nothing aggravates the symptoms. She has tried nothing for the symptoms.    OB History    Gravida  2   Para  1   Term  1   Preterm      AB      Living  1     SAB      TAB      Ectopic      Multiple  0   Live Births  1           Past Medical History:  Diagnosis Date  . Hx of chlamydia infection 03/2016  . Hypertension   . Infection    UTI  . Ovarian cyst   . Postpartum hypertension 07/09/2016  . Uterine fibroid     Past Surgical History:  Procedure Laterality Date  . NO PAST SURGERIES      Family History  Problem Relation Age of Onset  . Hypertension Mother   . Cancer Father        lung    Social History   Tobacco Use  . Smoking status: Former Smoker    Packs/day: 0.50    Types: Cigarettes    Last attempt to quit: 04/27/2015    Years since quitting: 2.4  .  Smokeless tobacco: Never Used  Substance Use Topics  . Alcohol use: Yes    Comment: none since pregnancy  . Drug use: No    Allergies: No Known Allergies  Medications Prior to Admission  Medication Sig Dispense Refill Last Dose  . aspirin EC 81 MG tablet Take 81 mg by mouth daily.   Taking  . Prenatal Vit-Fe Fumarate-FA (PRENATAL MULTIVITAMIN) TABS tablet Take 1 tablet by mouth daily at 12 noon. (Patient taking differently: Take 1 tablet by mouth daily. ) 100 tablet 3 Taking    Review of Systems  Constitutional: Negative for chills and fever.  Gastrointestinal: Negative for diarrhea and vomiting.  Genitourinary: Positive for pelvic pain. Negative for dysuria, vaginal bleeding and vaginal discharge.   Physical Exam   Blood pressure 128/73, pulse (!) 102, temperature 97.7 F (36.5 C), temperature source Oral, resp. rate 18, weight 246 lb 1.9 oz (111.6 kg), last menstrual period 01/31/2017, SpO2 100 %, unknown if currently breastfeeding.  Physical Exam  Nursing note and vitals reviewed. Constitutional: She is oriented to person, place, and time. She  appears well-developed and well-nourished. No distress.  HENT:  Head: Normocephalic.  Cardiovascular: Normal rate.  Respiratory: Effort normal.  GI: Soft. There is no tenderness. There is no rebound.  Genitourinary:  Genitourinary Comments:  Cervix: 1.5/thick/ballotable   Neurological: She is alert and oriented to person, place, and time.  Skin: Skin is warm and dry.  Psychiatric: She has a normal mood and affect.     NST:  Baseline: 140 Variability: moderate Accels: 15x15 Decels: one questionable variable while patient was on her back for cervical exam Toco: none    BPP 8/8   Results for orders placed or performed during the hospital encounter of 10/07/17 (from the past 24 hour(s))  Urinalysis, Routine w reflex microscopic     Status: None   Collection Time: 10/07/17  2:50 PM  Result Value Ref Range   Color, Urine  YELLOW YELLOW   APPearance CLEAR CLEAR   Specific Gravity, Urine 1.014 1.005 - 1.030   pH 7.0 5.0 - 8.0   Glucose, UA NEGATIVE NEGATIVE mg/dL   Hgb urine dipstick NEGATIVE NEGATIVE   Bilirubin Urine NEGATIVE NEGATIVE   Ketones, ur NEGATIVE NEGATIVE mg/dL   Protein, ur NEGATIVE NEGATIVE mg/dL   Nitrite NEGATIVE NEGATIVE   Leukocytes, UA NEGATIVE NEGATIVE    MAU Course  Procedures  MDM   Assessment and Plan   1. Pain of round ligament affecting pregnancy, antepartum   2. Back pain affecting pregnancy in third trimester   3. [redacted] weeks gestation of pregnancy    DC home Comfort measures reviewed  3rd Trimester precautions  PTL precautions  Fetal kick counts RX: none  Return to MAU as needed FU with OB as planned  [place] for [place] Follow up.   Specialty:  Obstetrics and Gynecology Contact information: [place] 10/07/2017, 2:32 PM

## 2017-10-07 NOTE — Discharge Instructions (Signed)

## 2017-10-10 ENCOUNTER — Ambulatory Visit: Payer: Self-pay

## 2017-10-10 ENCOUNTER — Encounter: Payer: Self-pay | Admitting: Obstetrics and Gynecology

## 2017-10-10 ENCOUNTER — Ambulatory Visit (INDEPENDENT_AMBULATORY_CARE_PROVIDER_SITE_OTHER): Payer: 59 | Admitting: Obstetrics and Gynecology

## 2017-10-10 ENCOUNTER — Ambulatory Visit (INDEPENDENT_AMBULATORY_CARE_PROVIDER_SITE_OTHER): Payer: 59 | Admitting: *Deleted

## 2017-10-10 VITALS — BP 139/83 | HR 90 | Wt 247.0 lb

## 2017-10-10 DIAGNOSIS — O10919 Unspecified pre-existing hypertension complicating pregnancy, unspecified trimester: Secondary | ICD-10-CM

## 2017-10-10 DIAGNOSIS — E669 Obesity, unspecified: Secondary | ICD-10-CM

## 2017-10-10 DIAGNOSIS — O10913 Unspecified pre-existing hypertension complicating pregnancy, third trimester: Secondary | ICD-10-CM | POA: Diagnosis not present

## 2017-10-10 DIAGNOSIS — Z029 Encounter for administrative examinations, unspecified: Secondary | ICD-10-CM

## 2017-10-10 DIAGNOSIS — O0990 Supervision of high risk pregnancy, unspecified, unspecified trimester: Secondary | ICD-10-CM

## 2017-10-10 DIAGNOSIS — Z113 Encounter for screening for infections with a predominantly sexual mode of transmission: Secondary | ICD-10-CM | POA: Diagnosis not present

## 2017-10-10 LAB — POCT URINALYSIS DIP (DEVICE)
Bilirubin Urine: NEGATIVE
Glucose, UA: NEGATIVE mg/dL
Ketones, ur: NEGATIVE mg/dL
Leukocytes, UA: NEGATIVE
Nitrite: NEGATIVE
Protein, ur: NEGATIVE mg/dL
Specific Gravity, Urine: 1.015 (ref 1.005–1.030)
Urobilinogen, UA: 0.2 mg/dL (ref 0.0–1.0)
pH: 6.5 (ref 5.0–8.0)

## 2017-10-10 NOTE — Progress Notes (Signed)

## 2017-10-10 NOTE — Progress Notes (Signed)
Pt had MAU visit on 7/21 due to abdominal pain and decreased FM.   [place] growth/BPP scheduled on 7/30.  Pt denies H/A or visual disturbances today.

## 2017-10-10 NOTE — Progress Notes (Signed)
   PRENATAL VISIT NOTE  Subjective:  Anita Potter is a 32 y.o. G2P1001 at [redacted]w[redacted]d being seen today for ongoing prenatal care.  She is currently monitored for the following issues for this high-risk pregnancy and has Supervision of high risk pregnancy, antepartum; Vertigo; Chronic hypertension during pregnancy, antepartum; Obesity (BMI 35.0-39.9 without comorbidity); Short interval between pregnancies affecting pregnancy, antepartum; and Obesity in pregnancy on their problem list.  Patient reports pressure.  Contractions: Irregular. Vag. Bleeding: None.  Movement: Present. Denies leaking of fluid.   The following portions of the patient's history were reviewed and updated as appropriate: allergies, current medications, past family history, past medical history, past social history, past surgical history and problem list. Problem list updated.  Objective:   Vitals:   10/10/17 0822  BP: 139/83  Pulse: 90  Weight: 247 lb (112 kg)    Fetal Status: Fetal Heart Rate (bpm): NST   Movement: Present     General:  Alert, oriented and cooperative. Patient is in no acute distress.  Skin: Skin is warm and dry. No rash noted.   Cardiovascular: Normal heart rate noted  Respiratory: Normal respiratory effort, no problems with respiration noted  Abdomen: Soft, gravid, appropriate for gestational age.  Pain/Pressure: Present     Pelvic: Cervical exam deferred        Extremities: Normal range of motion.     Mental Status: Normal mood and affect. Normal behavior. Normal judgment and thought content.   Assessment and Plan:  Pregnancy: G2P1001 at [redacted]w[redacted]d  1. Supervision of high risk pregnancy, antepartum - GC/Chlamydia probe amp ([place])not at [facility] - Strep Gp B NAA  2. Chronic hypertension during pregnancy, antepartum Weekly testing BPP today 10/10 NST reactive Cont baby ASA  3. Obesity (BMI 35.0-39.9 without comorbidity)   Preterm labor symptoms and general obstetric precautions  including but not limited to vaginal bleeding, contractions, leaking of fluid and fetal movement were reviewed in detail with the patient. Please refer to After Visit Summary for other counseling recommendations.  Return in about 6 days (around 10/16/2017) for as scheduled, OB visit (MD).  Future Appointments  Date Time Provider [place]  10/16/2017  9:30 AM WH-MFC [place] 5 WH-MFCUS MFC-US  10/16/2017 10:55 AM Chancy Milroy, MD WOC-WOCA WOC  10/16/2017 11:15 AM WOC-WOCA NST WOC-WOCA WOC    Sloan Leiter, MD

## 2017-10-11 LAB — GC/CHLAMYDIA PROBE AMP (~~LOC~~) NOT AT ARMC
Chlamydia: NEGATIVE
Neisseria Gonorrhea: NEGATIVE

## 2017-10-12 LAB — STREP GP B NAA: Strep Gp B NAA: NEGATIVE

## 2017-10-15 ENCOUNTER — Telehealth: Payer: Self-pay | Admitting: *Deleted

## 2017-10-15 NOTE — Telephone Encounter (Signed)
Pt left VM stating that she has increased swelling of feet and ankles. She is not able to wear her steel toe boots which are required for her job because she cannot get them on her feet. She is unsure what to do. She stated that she has office appt tomorrow and will discuss with the doctor at that time.

## 2017-10-16 ENCOUNTER — Encounter: Payer: Self-pay | Admitting: Obstetrics and Gynecology

## 2017-10-16 ENCOUNTER — Other Ambulatory Visit: Payer: Self-pay | Admitting: Obstetrics & Gynecology

## 2017-10-16 ENCOUNTER — Ambulatory Visit (INDEPENDENT_AMBULATORY_CARE_PROVIDER_SITE_OTHER): Payer: 59 | Admitting: Obstetrics and Gynecology

## 2017-10-16 ENCOUNTER — Encounter: Payer: 59 | Admitting: Obstetrics and Gynecology

## 2017-10-16 ENCOUNTER — Ambulatory Visit (INDEPENDENT_AMBULATORY_CARE_PROVIDER_SITE_OTHER): Payer: 59 | Admitting: *Deleted

## 2017-10-16 ENCOUNTER — Ambulatory Visit (HOSPITAL_COMMUNITY)
Admission: RE | Admit: 2017-10-16 | Discharge: 2017-10-16 | Disposition: A | Discharge status: Home / Self Care | Payer: 59 | Source: Ambulatory Visit | Attending: Obstetrics and Gynecology | Admitting: Obstetrics and Gynecology

## 2017-10-16 ENCOUNTER — Other Ambulatory Visit (HOSPITAL_COMMUNITY): Payer: Self-pay | Admitting: Obstetrics and Gynecology

## 2017-10-16 ENCOUNTER — Encounter (HOSPITAL_COMMUNITY): Payer: Self-pay

## 2017-10-16 VITALS — BP 136/84 | HR 90

## 2017-10-16 DIAGNOSIS — O10913 Unspecified pre-existing hypertension complicating pregnancy, third trimester: Secondary | ICD-10-CM

## 2017-10-16 DIAGNOSIS — O99213 Obesity complicating pregnancy, third trimester: Secondary | ICD-10-CM

## 2017-10-16 DIAGNOSIS — Z3A36 36 weeks gestation of pregnancy: Secondary | ICD-10-CM

## 2017-10-16 DIAGNOSIS — Z362 Encounter for other antenatal screening follow-up: Secondary | ICD-10-CM | POA: Diagnosis present

## 2017-10-16 DIAGNOSIS — O10919 Unspecified pre-existing hypertension complicating pregnancy, unspecified trimester: Secondary | ICD-10-CM

## 2017-10-16 DIAGNOSIS — O0990 Supervision of high risk pregnancy, unspecified, unspecified trimester: Secondary | ICD-10-CM

## 2017-10-16 NOTE — Patient Instructions (Signed)
Third Trimester of Pregnancy The third trimester is from week 28 through week 40 (months 7 through 9). The third trimester is a time when the unborn baby (fetus) is growing rapidly. At the end of the ninth month, the fetus is about 20 inches in length and weighs 6-10 pounds. Body changes during your third trimester Your body will continue to go through many changes during pregnancy. The changes vary from woman to woman. During the third trimester:  Your weight will continue to increase. You can expect to gain 25-35 pounds (11-16 kg) by the end of the pregnancy.  You may begin to get stretch marks on your hips, abdomen, and breasts.  You may urinate more often because the fetus is moving lower into your pelvis and pressing on your bladder.  You may develop or continue to have heartburn. This is caused by increased hormones that slow down muscles in the digestive tract.  You may develop or continue to have constipation because increased hormones slow digestion and cause the muscles that push waste through your intestines to relax.  You may develop hemorrhoids. These are swollen veins (varicose veins) in the rectum that can itch or be painful.  You may develop swollen, bulging veins (varicose veins) in your legs.  You may have increased body aches in the pelvis, back, or thighs. This is due to weight gain and increased hormones that are relaxing your joints.  You may have changes in your hair. These can include thickening of your hair, rapid growth, and changes in texture. Some women also have hair loss during or after pregnancy, or hair that feels dry or thin. Your hair will most likely return to normal after your baby is born.  Your breasts will continue to grow and they will continue to become tender. A yellow fluid (colostrum) may leak from your breasts. This is the first milk you are producing for your baby.  Your belly button may stick out.  You may notice more swelling in your hands,  face, or ankles.  You may have increased tingling or numbness in your hands, arms, and legs. The skin on your belly may also feel numb.  You may feel short of breath because of your expanding uterus.  You may have more problems sleeping. This can be caused by the size of your belly, increased need to urinate, and an increase in your body's metabolism.  You may notice the fetus "dropping," or moving lower in your abdomen (lightening).  You may have increased vaginal discharge.  You may notice your joints feel loose and you may have pain around your pelvic bone.  What to expect at prenatal visits You will have prenatal exams every 2 weeks until week 36. Then you will have weekly prenatal exams. During a routine prenatal visit:  You will be weighed to make sure you and the baby are growing normally.  Your blood pressure will be taken.  Your abdomen will be measured to track your baby's growth.  The fetal heartbeat will be listened to.  Any test results from the previous visit will be discussed.  You may have a cervical check near your due date to see if your cervix has softened or thinned (effaced).  You will be tested for Group B streptococcus. This happens between 35 and 37 weeks.  Your health care provider may ask you:  What your birth plan is.  How you are feeling.  If you are feeling the baby move.  If you have had   any abnormal symptoms, such as leaking fluid, bleeding, severe headaches, or abdominal cramping.  If you are using any tobacco products, including cigarettes, chewing tobacco, and electronic cigarettes.  If you have any questions.  Other tests or screenings that may be performed during your third trimester include:  Blood tests that check for low iron levels (anemia).  Fetal testing to check the health, activity level, and growth of the fetus. Testing is done if you have certain medical conditions or if there are problems during the  pregnancy.  Nonstress test (NST). This test checks the health of your baby to make sure there are no signs of problems, such as the baby not getting enough oxygen. During this test, a belt is placed around your belly. The baby is made to move, and its heart rate is monitored during movement.  What is false labor? False labor is a condition in which you feel small, irregular tightenings of the muscles in the womb (contractions) that usually go away with rest, changing position, or drinking water. These are called Braxton Hicks contractions. Contractions may last for hours, days, or even weeks before true labor sets in. If contractions come at regular intervals, become more frequent, increase in intensity, or become painful, you should see your health care provider. What are the signs of labor?  Abdominal cramps.  Regular contractions that start at 10 minutes apart and become stronger and more frequent with time.  Contractions that start on the top of the uterus and spread down to the lower abdomen and back.  Increased pelvic pressure and dull back pain.  A watery or bloody mucus discharge that comes from the vagina.  Leaking of amniotic fluid. This is also known as your "water breaking." It could be a slow trickle or a gush. Let your health care provider know if it has a color or strange odor. If you have any of these signs, call your health care provider right away, even if it is before your due date. Follow these instructions at home: Medicines  Follow your health care provider's instructions regarding medicine use. Specific medicines may be either safe or unsafe to take during pregnancy.  Take a prenatal vitamin that contains at least 600 micrograms (mcg) of folic acid.  If you develop constipation, try taking a stool softener if your health care provider approves. Eating and drinking  Eat a balanced diet that includes fresh fruits and vegetables, whole grains, good sources of protein  such as meat, eggs, or tofu, and low-fat dairy. Your health care provider will help you determine the amount of weight gain that is right for you.  Avoid raw meat and uncooked cheese. These carry germs that can cause birth defects in the baby.  If you have low calcium intake from food, talk to your health care provider about whether you should take a daily calcium supplement.  Eat four or five small meals rather than three large meals a day.  Limit foods that are high in fat and processed sugars, such as fried and sweet foods.  To prevent constipation: ? Drink enough fluid to keep your urine clear or pale yellow. ? Eat foods that are high in fiber, such as fresh fruits and vegetables, whole grains, and beans. Activity  Exercise only as directed by your health care provider. Most women can continue their usual exercise routine during pregnancy. Try to exercise for 30 minutes at least 5 days a week. Stop exercising if you experience uterine contractions.  Avoid heavy   lifting.  Do not exercise in extreme heat or humidity, or at high altitudes.  Wear low-heel, comfortable shoes.  Practice good posture.  You may continue to have sex unless your health care provider tells you otherwise. Relieving pain and discomfort  Take frequent breaks and rest with your legs elevated if you have leg cramps or low back pain.  Take warm sitz baths to soothe any pain or discomfort caused by hemorrhoids. Use hemorrhoid cream if your health care provider approves.  Wear a good support bra to prevent discomfort from breast tenderness.  If you develop varicose veins: ? Wear support pantyhose or compression stockings as told by your healthcare provider. ? Elevate your feet for 15 minutes, 3-4 times a day. Prenatal care  Write down your questions. Take them to your prenatal visits.  Keep all your prenatal visits as told by your health care provider. This is important. Safety  Wear your seat belt at  all times when driving.  Make a list of emergency phone numbers, including numbers for family, friends, the hospital, and police and fire departments. General instructions  Avoid cat litter boxes and soil used by cats. These carry germs that can cause birth defects in the baby. If you have a cat, ask someone to clean the litter box for you.  Do not travel far distances unless it is absolutely necessary and only with the approval of your health care provider.  Do not use hot tubs, steam rooms, or saunas.  Do not drink alcohol.  Do not use any products that contain nicotine or tobacco, such as cigarettes and e-cigarettes. If you need help quitting, ask your health care provider.  Do not use any medicinal herbs or unprescribed drugs. These chemicals affect the formation and growth of the baby.  Do not douche or use tampons or scented sanitary pads.  Do not cross your legs for long periods of time.  To prepare for the arrival of your baby: ? Take prenatal classes to understand, practice, and ask questions about labor and delivery. ? Make a trial run to the hospital. ? Visit the hospital and tour the maternity area. ? Arrange for maternity or paternity leave through employers. ? Arrange for family and friends to take care of pets while you are in the hospital. ? Purchase a rear-facing car seat and make sure you know how to install it in your car. ? Pack your hospital bag. ? Prepare the baby's nursery. Make sure to remove all pillows and stuffed animals from the baby's crib to prevent suffocation.  Visit your dentist if you have not gone during your pregnancy. Use a soft toothbrush to brush your teeth and be gentle when you floss. Contact a health care provider if:  You are unsure if you are in labor or if your water has broken.  You become dizzy.  You have mild pelvic cramps, pelvic pressure, or nagging pain in your abdominal area.  You have lower back pain.  You have persistent  nausea, vomiting, or diarrhea.  You have an unusual or bad smelling vaginal discharge.  You have pain when you urinate. Get help right away if:  Your water breaks before 37 weeks.  You have regular contractions less than 5 minutes apart before 37 weeks.  You have a fever.  You are leaking fluid from your vagina.  You have spotting or bleeding from your vagina.  You have severe abdominal pain or cramping.  You have rapid weight loss or weight gain.    You have shortness of breath with chest pain.  You notice sudden or extreme swelling of your face, hands, ankles, feet, or legs.  Your baby makes fewer than 10 movements in 2 hours.  You have severe headaches that do not go away when you take medicine.  You have vision changes. Summary  The third trimester is from week 28 through week 40, months 7 through 9. The third trimester is a time when the unborn baby (fetus) is growing rapidly.  During the third trimester, your discomfort may increase as you and your baby continue to gain weight. You may have abdominal, leg, and back pain, sleeping problems, and an increased need to urinate.  During the third trimester your breasts will keep growing and they will continue to become tender. A yellow fluid (colostrum) may leak from your breasts. This is the first milk you are producing for your baby.  False labor is a condition in which you feel small, irregular tightenings of the muscles in the womb (contractions) that eventually go away. These are called Braxton Hicks contractions. Contractions may last for hours, days, or even weeks before true labor sets in.  Signs of labor can include: abdominal cramps; regular contractions that start at 10 minutes apart and become stronger and more frequent with time; watery or bloody mucus discharge that comes from the vagina; increased pelvic pressure and dull back pain; and leaking of amniotic fluid. This information is not intended to replace advice  given to you by your health care provider. Make sure you discuss any questions you have with your health care provider. Document Released: 02/28/2001 Document Revised: 08/12/2015 Document Reviewed: 05/07/2012 Elsevier Interactive Patient Education  2017 [employer]  

## 2017-10-16 NOTE — Progress Notes (Signed)
Subjective:  Anita Potter is a 32 y.o. G2P1001 at [redacted]w[redacted]d being seen today for ongoing prenatal care.  She is currently monitored for the following issues for this high-risk pregnancy and has Supervision of high risk pregnancy, antepartum; Vertigo; Chronic hypertension during pregnancy, antepartum; Obesity (BMI 35.0-39.9 without comorbidity); Short interval between pregnancies affecting pregnancy, antepartum; and Obesity in pregnancy on their problem list.  Patient reports general discomforts of pregnancy.  Contractions: Irregular. Vag. Bleeding: None.  Movement: Present. Denies leaking of fluid.   The following portions of the patient's history were reviewed and updated as appropriate: allergies, current medications, past family history, past medical history, past social history, past surgical history and problem list. Problem list updated.  Objective:   Vitals:   10/16/17 1054 10/16/17 1055  BP: (!) 144/86 136/84  Pulse: 90 90    Fetal Status: Fetal Heart Rate (bpm): 152   Movement: Present     General:  Alert, oriented and cooperative. Patient is in no acute distress.  Skin: Skin is warm and dry. No rash noted.   Cardiovascular: Normal heart rate noted  Respiratory: Normal respiratory effort, no problems with respiration noted  Abdomen: Soft, gravid, appropriate for gestational age. Pain/Pressure: Present     Pelvic:  Cervical exam deferred        Extremities: Normal range of motion.  Edema: Trace  Mental Status: Normal mood and affect. Normal behavior. Normal judgment and thought content.   Urinalysis:      Assessment and Plan:  Pregnancy: G2P1001 at [redacted]w[redacted]d  1. Supervision of high risk pregnancy, antepartum Stable Labor precautions  2. Chronic hypertension during pregnancy, antepartum BP stable without meds Continue with BASA BPP 8/8 today NST today Growth scan 86 % today IOL at 40 weeks Schedule at next OBV  Term labor symptoms and general obstetric precautions  including but not limited to vaginal bleeding, contractions, leaking of fluid and fetal movement were reviewed in detail with the patient. Please refer to After Visit Summary for other counseling recommendations.  Return in about 1 week (around 10/23/2017) for OB visit.   Chancy Milroy, MD

## 2017-10-22 ENCOUNTER — Telehealth (HOSPITAL_COMMUNITY): Payer: Self-pay | Admitting: *Deleted

## 2017-10-22 ENCOUNTER — Ambulatory Visit (INDEPENDENT_AMBULATORY_CARE_PROVIDER_SITE_OTHER): Payer: 59 | Admitting: *Deleted

## 2017-10-22 ENCOUNTER — Ambulatory Visit: Payer: Self-pay

## 2017-10-22 ENCOUNTER — Ambulatory Visit (INDEPENDENT_AMBULATORY_CARE_PROVIDER_SITE_OTHER): Payer: 59 | Admitting: Family Medicine

## 2017-10-22 VITALS — BP 137/85 | HR 91 | Wt 248.5 lb

## 2017-10-22 DIAGNOSIS — O10919 Unspecified pre-existing hypertension complicating pregnancy, unspecified trimester: Secondary | ICD-10-CM

## 2017-10-22 DIAGNOSIS — O0993 Supervision of high risk pregnancy, unspecified, third trimester: Secondary | ICD-10-CM

## 2017-10-22 DIAGNOSIS — O10913 Unspecified pre-existing hypertension complicating pregnancy, third trimester: Secondary | ICD-10-CM

## 2017-10-22 DIAGNOSIS — O99213 Obesity complicating pregnancy, third trimester: Secondary | ICD-10-CM

## 2017-10-22 DIAGNOSIS — O99210 Obesity complicating pregnancy, unspecified trimester: Secondary | ICD-10-CM

## 2017-10-22 DIAGNOSIS — E669 Obesity, unspecified: Secondary | ICD-10-CM

## 2017-10-22 DIAGNOSIS — O0990 Supervision of high risk pregnancy, unspecified, unspecified trimester: Secondary | ICD-10-CM

## 2017-10-22 NOTE — Progress Notes (Signed)

## 2017-10-22 NOTE — Patient Instructions (Signed)

## 2017-10-22 NOTE — Progress Notes (Signed)
   PRENATAL VISIT NOTE  Subjective:  Anita Potter is a 32 y.o. G2P1001 at [redacted]w[redacted]d being seen today for ongoing prenatal care.  She is currently monitored for the following issues for this high-risk pregnancy and has Supervision of high risk pregnancy, antepartum; Vertigo; Chronic hypertension during pregnancy, antepartum; Obesity (BMI 35.0-39.9 without comorbidity); Short interval between pregnancies affecting pregnancy, antepartum; and Obesity in pregnancy on their problem list.  Patient reports backache and lost mucous plug.  Contractions: Irregular. Vag. Bleeding: None.  Movement: Present. Denies leaking of fluid.   The following portions of the patient's history were reviewed and updated as appropriate: allergies, current medications, past family history, past medical history, past social history, past surgical history and problem list. Problem list updated.  Objective:   Vitals:   10/22/17 1028  BP: 137/85  Pulse: 91  Weight: 248 lb 8 oz (112.7 kg)    Fetal Status: Fetal Heart Rate (bpm): NST Fundal Height: 36 cm Movement: Present  Presentation: Vertex  General:  Alert, oriented and cooperative. Patient is in no acute distress.  Skin: Skin is warm and dry. No rash noted.   Cardiovascular: Normal heart rate noted  Respiratory: Normal respiratory effort, no problems with respiration noted  Abdomen: Soft, gravid, appropriate for gestational age.  Pain/Pressure: Present     Pelvic: Cervical exam performed Dilation: 2 Effacement (%): 60 Station: -3  Extremities: Normal range of motion.  Edema: Trace  Mental Status: Normal mood and affect. Normal behavior. Normal judgment and thought content.  NST:  Baseline: 135 bpm, Variability: Good {> 6 bpm), Accelerations: Reactive and Decelerations: Absent   Assessment and Plan:  Pregnancy: G2P1001 at [redacted]w[redacted]d  1. Supervision of high risk pregnancy, antepartum Continue prenatal care.   2. Chronic hypertension during pregnancy,  antepartum On ASA BP is well controlled on no meds U/s for growth done last week 7 lb 6 oz 3351 gms (86%) IOL at 39 wks.--orders placed today  3. Obesity in pregnancy   Term labor symptoms and general obstetric precautions including but not limited to vaginal bleeding, contractions, leaking of fluid and fetal movement were reviewed in detail with the patient. Please refer to After Visit Summary for other counseling recommendations.  Return in 1 week (on 10/29/2017).  Future Appointments  Date Time Provider [place]  10/30/2017  9:15 AM WOC-WOCA NST WOC-WOCA WOC  10/30/2017 10:25 AM Lavonia Drafts, MD WOC-WOCA WOC  10/31/2017  6:30 AM WH-BSSCHED ROOM WH-BSSCHED None    Donnamae Jude, MD

## 2017-10-22 NOTE — Telephone Encounter (Signed)
Preadmission screen  

## 2017-10-25 ENCOUNTER — Encounter: Payer: 59 | Admitting: Obstetrics & Gynecology

## 2017-10-25 ENCOUNTER — Other Ambulatory Visit: Payer: 59

## 2017-10-25 ENCOUNTER — Encounter (HOSPITAL_COMMUNITY): Payer: Self-pay

## 2017-10-25 ENCOUNTER — Inpatient Hospital Stay (HOSPITAL_COMMUNITY)
Admission: AD | Admit: 2017-10-25 | Discharge: 2017-10-25 | Disposition: A | Discharge status: Home / Self Care | Payer: 59 | Source: Ambulatory Visit | Attending: Obstetrics & Gynecology | Admitting: Obstetrics & Gynecology

## 2017-10-25 DIAGNOSIS — R109 Unspecified abdominal pain: Secondary | ICD-10-CM | POA: Diagnosis present

## 2017-10-25 DIAGNOSIS — Z87891 Personal history of nicotine dependence: Secondary | ICD-10-CM | POA: Insufficient documentation

## 2017-10-25 DIAGNOSIS — O163 Unspecified maternal hypertension, third trimester: Secondary | ICD-10-CM | POA: Insufficient documentation

## 2017-10-25 DIAGNOSIS — O10913 Unspecified pre-existing hypertension complicating pregnancy, third trimester: Secondary | ICD-10-CM | POA: Diagnosis not present

## 2017-10-25 DIAGNOSIS — O26893 Other specified pregnancy related conditions, third trimester: Secondary | ICD-10-CM | POA: Diagnosis present

## 2017-10-25 DIAGNOSIS — R1110 Vomiting, unspecified: Secondary | ICD-10-CM | POA: Diagnosis present

## 2017-10-25 DIAGNOSIS — O471 False labor at or after 37 completed weeks of gestation: Secondary | ICD-10-CM | POA: Diagnosis not present

## 2017-10-25 DIAGNOSIS — Z3A38 38 weeks gestation of pregnancy: Secondary | ICD-10-CM | POA: Diagnosis not present

## 2017-10-25 DIAGNOSIS — O10919 Unspecified pre-existing hypertension complicating pregnancy, unspecified trimester: Secondary | ICD-10-CM

## 2017-10-25 DIAGNOSIS — O479 False labor, unspecified: Secondary | ICD-10-CM

## 2017-10-25 LAB — COMPREHENSIVE METABOLIC PANEL
ALT: 10 U/L (ref 0–44)
AST: 15 U/L (ref 15–41)
Albumin: 2.9 g/dL — ABNORMAL LOW (ref 3.5–5.0)
Alkaline Phosphatase: 117 U/L (ref 38–126)
Anion gap: 9 (ref 5–15)
BUN: 5 mg/dL — ABNORMAL LOW (ref 6–20)
CO2: 20 mmol/L — ABNORMAL LOW (ref 22–32)
Calcium: 8.5 mg/dL — ABNORMAL LOW (ref 8.9–10.3)
Chloride: 108 mmol/L (ref 98–111)
Creatinine, Ser: 0.52 mg/dL (ref 0.44–1.00)
GFR calc Af Amer: >60 mL/min (ref >60)
GFR calc non Af Amer: >60 mL/min (ref >60)
Glucose, Bld: 124 mg/dL — ABNORMAL HIGH (ref 70–99)
Potassium: 3.3 mmol/L — ABNORMAL LOW (ref 3.5–5.1)
Sodium: 137 mmol/L (ref 135–145)
Total Bilirubin: 0.4 mg/dL (ref 0.3–1.2)
Total Protein: 6.6 g/dL (ref 6.5–8.1)

## 2017-10-25 LAB — URINALYSIS, ROUTINE W REFLEX MICROSCOPIC
Bilirubin Urine: NEGATIVE
Glucose, UA: NEGATIVE mg/dL
Ketones, ur: NEGATIVE mg/dL
Leukocytes, UA: NEGATIVE
Nitrite: NEGATIVE
Protein, ur: NEGATIVE mg/dL
Specific Gravity, Urine: 1.014 (ref 1.005–1.030)
pH: 6 (ref 5.0–8.0)

## 2017-10-25 LAB — CBC
HCT: 34.3 % — ABNORMAL LOW (ref 36.0–46.0)
Hemoglobin: 11.9 g/dL — ABNORMAL LOW (ref 12.0–15.0)
MCH: 29.9 pg (ref 26.0–34.0)
MCHC: 34.7 g/dL (ref 30.0–36.0)
MCV: 86.2 fL (ref 78.0–100.0)
Platelets: 220 10*3/uL (ref 150–400)
RBC: 3.98 MIL/uL (ref 3.87–5.11)
RDW: 12.5 % (ref 11.5–15.5)
WBC: 8 10*3/uL (ref 4.0–10.5)

## 2017-10-25 LAB — PROTEIN / CREATININE RATIO, URINE
Creatinine, Urine: 125 mg/dL
Protein Creatinine Ratio: 0.1 mg/mg{Cre} (ref 0.00–0.15)
Total Protein, Urine: 13 mg/dL

## 2017-10-25 MED ORDER — ONDANSETRON 8 MG PO TBDP
8.0000 mg | ORAL_TABLET | Freq: Once | ORAL | Status: AC
  Administered 2017-10-25: 8 mg via ORAL
  Filled 2017-10-25: qty 1

## 2017-10-25 NOTE — MAU Provider Note (Signed)
History     CSN: [id number]  Arrival date and time: 10/25/17 4665   First Provider Initiated Contact with Patient 10/25/17 1135      Chief Complaint  Patient presents with  . Emesis  . Abdominal Pain   HPI   Anita Potter is a 32 y.o. female G2P1001 @ 53w1dwith a history of CHTN here with vaginal pressure and nausea. The vaginal pressure started 1 week ago. The pressure comes and goes. Says she has been trying to decrease her activity because that helps. She has not taken any medication for the pain. She has been taking warm baths which helps. She rates her pain 6/10.   OB History    Gravida  2   Para  1   Term  1   Preterm      AB      Living  1     SAB      TAB      Ectopic      Multiple  0   Live Births  1           Past Medical History:  Diagnosis Date  . Hx of chlamydia infection 03/2016  . Hypertension   . Infection    UTI  . Ovarian cyst   . Postpartum hypertension 07/09/2016  . Uterine fibroid     Past Surgical History:  Procedure Laterality Date  . NO PAST SURGERIES      Family History  Problem Relation Age of Onset  . Hypertension Mother   . Cancer Father        lung    Social History   Tobacco Use  . Smoking status: Former Smoker    Packs/day: 0.50    Types: Cigarettes    Last attempt to quit: 04/27/2015    Years since quitting: 2.4  . Smokeless tobacco: Never Used  Substance Use Topics  . Alcohol use: Not Currently    Comment: none since pregnancy  . Drug use: No    Allergies: No Known Allergies  Medications Prior to Admission  Medication Sig Dispense Refill Last Dose  . aspirin EC 81 MG tablet Take 81 mg by mouth daily.   10/25/2017 at Unknown time  . Prenatal Vit-Fe Fumarate-FA (PRENATAL MULTIVITAMIN) TABS tablet Take 1 tablet by mouth daily at 12 noon. (Patient taking differently: Take 1 tablet by mouth daily. ) 100 tablet 3 10/25/2017 at Unknown time   Results for orders placed or performed during the hospital  encounter of 10/25/17 (from the past 48 hour(s))  Urinalysis, Routine w reflex microscopic     Status: Abnormal   Collection Time: 10/25/17 10:03 AM  Result Value Ref Range   Color, Urine YELLOW YELLOW   APPearance HAZY (A) CLEAR   Specific Gravity, Urine 1.014 1.005 - 1.030   pH 6.0 5.0 - 8.0   Glucose, UA NEGATIVE NEGATIVE mg/dL   Hgb urine dipstick SMALL (A) NEGATIVE   Bilirubin Urine NEGATIVE NEGATIVE   Ketones, ur NEGATIVE NEGATIVE mg/dL   Protein, ur NEGATIVE NEGATIVE mg/dL   Nitrite NEGATIVE NEGATIVE   Leukocytes, UA NEGATIVE NEGATIVE   RBC / HPF 0-5 0 - 5 RBC/hpf   WBC, UA 0-5 0 - 5 WBC/hpf   Bacteria, UA RARE (A) NONE SEEN   Squamous Epithelial / LPF 0-5 0 - 5   Mucus PRESENT     Comment: [facility] [address], [place] [address] Protein / creatinine ratio, urine  Status: None   Collection Time: 10/25/17 10:03 AM  Result Value Ref Range   Creatinine, Urine 125.00 mg/dL   Total Protein, Urine 13 mg/dL    Comment: NO NORMAL RANGE ESTABLISHED FOR THIS TEST   Protein Creatinine Ratio 0.10 0.00 - 0.15 mg/mg[Cre]    Comment: Performed at [facility], [address], [place], [place] [address]  CBC     Status: Abnormal   Collection Time: 10/25/17 10:45 AM  Result Value Ref Range   WBC 8.0 4.0 - 10.5 K/uL   RBC 3.98 3.87 - 5.11 MIL/uL   Hemoglobin 11.9 (L) 12.0 - 15.0 g/dL   HCT 34.3 (L) 36.0 - 46.0 %   MCV 86.2 78.0 - 100.0 fL   MCH 29.9 26.0 - 34.0 pg   MCHC 34.7 30.0 - 36.0 g/dL   RDW 12.5 11.5 - 15.5 %   Platelets 220 150 - 400 K/uL    Comment: Performed at [facility], [address], [place], [place] [address]  Comprehensive metabolic panel     Status: Abnormal   Collection Time: 10/25/17 10:45 AM  Result Value Ref Range   Sodium 137 135 - 145 mmol/L   Potassium 3.3 (L) 3.5 - 5.1 mmol/L   Chloride 108 98 - 111 mmol/L   CO2 20 (L) 22 - 32 mmol/L   Glucose, Bld 124 (H) 70 - 99 mg/dL   BUN 5 (L) 6 - 20 mg/dL    Creatinine, Ser 0.52 0.44 - 1.00 mg/dL   Calcium 8.5 (L) 8.9 - 10.3 mg/dL   Total Protein 6.6 6.5 - 8.1 g/dL   Albumin 2.9 (L) 3.5 - 5.0 g/dL   AST 15 15 - 41 U/L   ALT 10 0 - 44 U/L   Alkaline Phosphatase 117 38 - 126 U/L   Total Bilirubin 0.4 0.3 - 1.2 mg/dL   GFR calc non Af Amer >60 >60 mL/min   GFR calc Af Amer >60 >60 mL/min    Comment: (NOTE) The eGFR has been calculated using the CKD EPI equation. This calculation has not been validated in all clinical situations. eGFR's persistently <60 mL/min signify possible Chronic Kidney Disease.    Anion gap 9 5 - 15    Comment: Performed at [facility], [address] [place], [place] [address]   Review of Systems  Constitutional: Negative for fever.  Eyes: Negative for photophobia and visual disturbance.  Gastrointestinal: Negative for abdominal pain.  Neurological: Negative for dizziness and headaches.   Physical Exam   Blood pressure (!) 158/84, pulse 93, temperature 98.3 F (36.8 C), resp. rate 18, height 5' 5"  (1.651 m), weight 113.4 kg, last menstrual period 01/31/2017, SpO2 92 %, unknown if currently breastfeeding.   Patient Vitals for the past 24 hrs:  BP Temp Pulse Resp SpO2 Height Weight  10/25/17 1200 (!) 152/84 - 85 - - - -  10/25/17 1145 (!) 145/83 - 89 - - - -  10/25/17 1130 (!) 158/84 - 93 - - - -  10/25/17 1115 (!) 148/81 - 88 - - - -  10/25/17 1100 139/90 - 98 - - - -  10/25/17 1045 (!) 143/79 - 97 - - - -  10/25/17 1030 132/81 - 93 - - - -  10/25/17 1015 (!) 143/76 - 88 - 92 % - -  10/25/17 1009 (!) 143/85 - 100 - 96 % - -  10/25/17 0953 (!) 153/90 98.3 F (36.8 C) 92 18 - 5' 5"  (1.651 m) 113.4 kg  Physical Exam  Constitutional: She is oriented to person, place, and time. She appears well-developed and well-nourished. No distress.  HENT:  Head: Normocephalic.  Respiratory: Effort normal.  GI: Soft. She exhibits no distension. There is no tenderness.  Musculoskeletal: Normal range of motion.   Neurological: She is alert and oriented to person, place, and time. She has normal reflexes. She displays normal reflexes.  Negative clonus   Skin: Skin is warm. She is not diaphoretic.  Psychiatric: Her behavior is normal.   Fetal Tracing: Baseline: 135 bpm Variability: moderate  Accelerations: 15x15 Decelerations: None Toco: occasional   MAU Course  Procedures  None  MDM  PIH labs NST  Discussed patient with Dr. Roselie Awkward including labs and BP readings, F/U in MAU Saturday for BP check. Reading for DC home.   Assessment and Plan   A:  1. Chronic hypertension during pregnancy, antepartum   2. Braxton Hicks contractions   3. [redacted] weeks gestation of pregnancy     P:  Discharge home in stable condition Return to MAU Saturday for a BP check Preeclampsia precautions Strict return precautions  Rasch, Artist Pais, NP 10/25/2017 9:46 PM

## 2017-10-25 NOTE — MAU Note (Signed)
Pt stated she has been feeling sick to her  for the past 3 days. Just feeling nauseated  has not vomited. Has has "soft stools" no diarrhea. Reports irregular contractions and increased pelvic pressure as well about 10-15 min apart. Good fetal movement reported and deneis any vag bleeding or discharge.

## 2017-10-25 NOTE — Discharge Instructions (Signed)

## 2017-10-27 ENCOUNTER — Inpatient Hospital Stay (HOSPITAL_COMMUNITY)
Admission: AD | Admit: 2017-10-27 | Discharge: 2017-10-27 | Disposition: A | Discharge status: Home / Self Care | Payer: 59 | Source: Ambulatory Visit | Attending: Family Medicine | Admitting: Family Medicine

## 2017-10-27 DIAGNOSIS — Z3A38 38 weeks gestation of pregnancy: Secondary | ICD-10-CM | POA: Diagnosis not present

## 2017-10-27 DIAGNOSIS — O163 Unspecified maternal hypertension, third trimester: Secondary | ICD-10-CM | POA: Insufficient documentation

## 2017-10-27 DIAGNOSIS — I10 Essential (primary) hypertension: Secondary | ICD-10-CM

## 2017-10-27 NOTE — Discharge Instructions (Signed)

## 2017-10-27 NOTE — MAU Note (Signed)
Here for BP check, no complaints, reports slight headache but thinks it is due to not having eaten yet, no blurry vision

## 2017-10-27 NOTE — MAU Provider Note (Signed)
Patient Anita Potter is a 32 y.o. G2P1001 here for repeat blood pressure check. She is a known chronic hypertensive; not on any medications.  She has a slight headache (2/10) that she thinks is due to not eating today (now 10AM). The headache started when she got here. She is currently in antepartum testing and has an induction scheduled for next Wednesday.  History     CSN: [id number]  Arrival date and time: 10/27/17 4315   None     Chief Complaint  Patient presents with  . Blood Pressure Check   HPI  OB History    Gravida  2   Para  1   Term  1   Preterm      AB      Living  1     SAB      TAB      Ectopic      Multiple  0   Live Births  1           Past Medical History:  Diagnosis Date  . Hx of chlamydia infection 03/2016  . Hypertension   . Infection    UTI  . Ovarian cyst   . Postpartum hypertension 07/09/2016  . Uterine fibroid     Past Surgical History:  Procedure Laterality Date  . NO PAST SURGERIES      Family History  Problem Relation Age of Onset  . Hypertension Mother   . Cancer Father        lung    Social History   Tobacco Use  . Smoking status: Former Smoker    Packs/day: 0.50    Types: Cigarettes    Last attempt to quit: 04/27/2015    Years since quitting: 2.5  . Smokeless tobacco: Never Used  Substance Use Topics  . Alcohol use: Not Currently    Comment: none since pregnancy  . Drug use: No    Allergies: No Known Allergies  Medications Prior to Admission  Medication Sig Dispense Refill Last Dose  . aspirin EC 81 MG tablet Take 81 mg by mouth daily.   10/25/2017 at Unknown time  . Prenatal Vit-Fe Fumarate-FA (PRENATAL MULTIVITAMIN) TABS tablet Take 1 tablet by mouth daily at 12 noon. (Patient taking differently: Take 1 tablet by mouth daily. ) 100 tablet 3 10/25/2017 at Unknown time    Review of Systems  Constitutional: Negative.   HENT: Negative.   Respiratory: Negative.   Cardiovascular: Negative.    Gastrointestinal: Negative.   Genitourinary: Negative.   Musculoskeletal: Negative.   Neurological: Negative.    Physical Exam   Blood pressure 134/85, pulse 81, temperature 98.2 F (36.8 C), temperature source Oral, resp. rate 18, weight 114.3 kg, last menstrual period 01/31/2017, unknown if currently breastfeeding.  Physical Exam  Constitutional: She is oriented to person, place, and time. She appears well-developed.  HENT:  Head: Normocephalic.  Eyes: Pupils are equal, round, and reactive to light.  Neck: Normal range of motion.  Respiratory: Effort normal.  GI: Soft.  Musculoskeletal: Normal range of motion.  Neurological: She is alert and oriented to person, place, and time.  Skin: Skin is warm and dry.  Psychiatric: She has a normal mood and affect.    MAU Course  Procedures  MDM BP normal today; continue to watch for s/s of pre-e at home. Return for prenatal visit on Tuesday.   Assessment and Plan   1. Chronic hypertension    2. Patient stable for discharge with strict return instructions  and plan to keep PNV next week.  If HA not relieve with food intake, return to MAU for further evaluation.  3. All questions answered.   Mervyn Skeeters Mikyla Schachter 10/27/2017, 9:54 AM

## 2017-10-30 ENCOUNTER — Ambulatory Visit (INDEPENDENT_AMBULATORY_CARE_PROVIDER_SITE_OTHER): Payer: 59 | Admitting: *Deleted

## 2017-10-30 ENCOUNTER — Ambulatory Visit: Payer: Self-pay

## 2017-10-30 ENCOUNTER — Ambulatory Visit (INDEPENDENT_AMBULATORY_CARE_PROVIDER_SITE_OTHER): Payer: 59 | Admitting: Obstetrics & Gynecology

## 2017-10-30 ENCOUNTER — Other Ambulatory Visit (HOSPITAL_COMMUNITY): Payer: Self-pay | Admitting: Family Medicine

## 2017-10-30 VITALS — BP 140/85 | HR 102 | Wt 252.9 lb

## 2017-10-30 DIAGNOSIS — O99213 Obesity complicating pregnancy, third trimester: Secondary | ICD-10-CM

## 2017-10-30 DIAGNOSIS — O09893 Supervision of other high risk pregnancies, third trimester: Secondary | ICD-10-CM

## 2017-10-30 DIAGNOSIS — O0993 Supervision of high risk pregnancy, unspecified, third trimester: Secondary | ICD-10-CM

## 2017-10-30 DIAGNOSIS — O10919 Unspecified pre-existing hypertension complicating pregnancy, unspecified trimester: Secondary | ICD-10-CM

## 2017-10-30 DIAGNOSIS — O10913 Unspecified pre-existing hypertension complicating pregnancy, third trimester: Secondary | ICD-10-CM | POA: Diagnosis not present

## 2017-10-30 DIAGNOSIS — O99210 Obesity complicating pregnancy, unspecified trimester: Secondary | ICD-10-CM

## 2017-10-30 DIAGNOSIS — O0990 Supervision of high risk pregnancy, unspecified, unspecified trimester: Secondary | ICD-10-CM

## 2017-10-30 DIAGNOSIS — E669 Obesity, unspecified: Secondary | ICD-10-CM

## 2017-10-30 DIAGNOSIS — O09899 Supervision of other high risk pregnancies, unspecified trimester: Secondary | ICD-10-CM

## 2017-10-30 LAB — POCT URINALYSIS DIP (DEVICE)
Bilirubin Urine: NEGATIVE
Glucose, UA: NEGATIVE mg/dL
Ketones, ur: NEGATIVE mg/dL
Nitrite: NEGATIVE
Protein, ur: NEGATIVE mg/dL
Specific Gravity, Urine: 1.02 (ref 1.005–1.030)
Urobilinogen, UA: 0.2 mg/dL (ref 0.0–1.0)
pH: 6.5 (ref 5.0–8.0)

## 2017-10-30 NOTE — Progress Notes (Signed)
   PRENATAL VISIT NOTE  Subjective:  Anita Potter is a 32 y.o. G2P1001 at [redacted]w[redacted]d being seen today for ongoing prenatal care.  She is currently monitored for the following issues for this high-risk pregnancy and has Supervision of high risk pregnancy, antepartum; Vertigo; Chronic hypertension during pregnancy, antepartum; Obesity (BMI 35.0-39.9 without comorbidity); Short interval between pregnancies affecting pregnancy, antepartum; and Obesity in pregnancy on their problem list.  Patient reports no complaints.  Contractions: Irregular. Vag. Bleeding: None.  Movement: Present. Denies leaking of fluid.   The following portions of the patient's history were reviewed and updated as appropriate: allergies, current medications, past family history, past medical history, past social history, past surgical history and problem list. Problem list updated.  Objective:   Vitals:   10/30/17 0928  BP: 140/85  Pulse: (!) 102  Weight: 252 lb 14.4 oz (114.7 kg)    Fetal Status: Fetal Heart Rate (bpm): NST   Movement: Present     General:  Alert, oriented and cooperative. Patient is in no acute distress.  Skin: Skin is warm and dry. No rash noted.   Cardiovascular: Normal heart rate noted  Respiratory: Normal respiratory effort, no problems with respiration noted  Abdomen: Soft, gravid, appropriate for gestational age.  Pain/Pressure: Present     Pelvic: Cervical exam deferred        Extremities: Normal range of motion.     Mental Status: Normal mood and affect. Normal behavior. Normal judgment and thought content.   Assessment and Plan:  Pregnancy: G2P1001 at [redacted]w[redacted]d  1. Supervision of high risk pregnancy, antepartum   2. Chronic hypertension during pregnancy, antepartum NST reviewed and reactive.  3. Obesity in pregnancy  4. Short interval between pregnancies affecting pregnancy, antepartum  5. Obesity (BMI 35.0-39.9 without comorbidity)  Term labor symptoms and general obstetric  precautions including but not limited to vaginal bleeding, contractions, leaking of fluid and fetal movement were reviewed in detail with the patient. Please refer to After Visit Summary for other counseling recommendations.  Return in about 3 weeks (around 11/20/2017) for PP visit.  IOL on 8/14.  Future Appointments  Date Time Provider [place]  10/31/2017  6:30 AM WH-BSSCHED ROOM WH-BSSCHED None    Lavonia Drafts, MD

## 2017-10-30 NOTE — Progress Notes (Addendum)
Pt informed that the ultrasound is considered a limited OB ultrasound and is not intended to be a complete ultrasound exam.  Patient also informed that the ultrasound is not being completed with the intent of assessing for fetal or placental anomalies or any pelvic abnormalities.  Explained that the purpose of today's ultrasound is to assess for presentation, BPP and amniotic fluid volume.  Patient acknowledges the purpose of the exam and the limitations of the study.    Attestation of Attending Supervision of RN: Evaluation and management procedures were performed by the nurse under my supervision and collaboration.  I have reviewed the nursing note and chart, and I agree with the management and plan.  Carolyn L. Harraway-Smith, M.D., FACOG   

## 2017-10-30 NOTE — Progress Notes (Signed)
Pt denies H/A or visual disturbances.  IOL scheduled 8/14 @ 0630.

## 2017-10-31 ENCOUNTER — Inpatient Hospital Stay (HOSPITAL_COMMUNITY)
Admission: RE | Admit: 2017-10-31 | Discharge: 2017-11-02 | DRG: 807 | Disposition: A | Discharge status: Home / Self Care | Payer: 59 | Attending: Obstetrics & Gynecology | Admitting: Obstetrics & Gynecology

## 2017-10-31 ENCOUNTER — Inpatient Hospital Stay (HOSPITAL_COMMUNITY): Payer: 59 | Admitting: Anesthesiology

## 2017-10-31 ENCOUNTER — Encounter (HOSPITAL_COMMUNITY): Payer: Self-pay

## 2017-10-31 DIAGNOSIS — Z7982 Long term (current) use of aspirin: Secondary | ICD-10-CM

## 2017-10-31 DIAGNOSIS — O169 Unspecified maternal hypertension, unspecified trimester: Secondary | ICD-10-CM | POA: Diagnosis present

## 2017-10-31 DIAGNOSIS — Z3A39 39 weeks gestation of pregnancy: Secondary | ICD-10-CM

## 2017-10-31 DIAGNOSIS — O0990 Supervision of high risk pregnancy, unspecified, unspecified trimester: Secondary | ICD-10-CM

## 2017-10-31 DIAGNOSIS — Z87891 Personal history of nicotine dependence: Secondary | ICD-10-CM | POA: Diagnosis not present

## 2017-10-31 DIAGNOSIS — O99824 Streptococcus B carrier state complicating childbirth: Secondary | ICD-10-CM

## 2017-10-31 DIAGNOSIS — O99214 Obesity complicating childbirth: Secondary | ICD-10-CM | POA: Diagnosis present

## 2017-10-31 DIAGNOSIS — O1002 Pre-existing essential hypertension complicating childbirth: Principal | ICD-10-CM | POA: Diagnosis present

## 2017-10-31 LAB — CBC
HCT: 36.2 % (ref 36.0–46.0)
HCT: 33.6 % — ABNORMAL LOW (ref 36.0–46.0)
Hemoglobin: 12.5 g/dL (ref 12.0–15.0)
Hemoglobin: 11.6 g/dL — ABNORMAL LOW (ref 12.0–15.0)
MCH: 29.9 pg (ref 26.0–34.0)
MCH: 30 pg (ref 26.0–34.0)
MCHC: 34.5 g/dL (ref 30.0–36.0)
MCHC: 34.5 g/dL (ref 30.0–36.0)
MCV: 86.6 fL (ref 78.0–100.0)
MCV: 86.8 fL (ref 78.0–100.0)
Platelets: 241 10*3/uL (ref 150–400)
Platelets: 209 10*3/uL (ref 150–400)
RBC: 4.18 MIL/uL (ref 3.87–5.11)
RBC: 3.87 MIL/uL (ref 3.87–5.11)
RDW: 12.6 % (ref 11.5–15.5)
RDW: 12.6 % (ref 11.5–15.5)
WBC: 8.4 10*3/uL (ref 4.0–10.5)
WBC: 12.8 10*3/uL — ABNORMAL HIGH (ref 4.0–10.5)

## 2017-10-31 LAB — TYPE AND SCREEN
ABO/RH(D): A POS
Antibody Screen: NEGATIVE

## 2017-10-31 LAB — RPR: RPR Ser Ql: NONREACTIVE

## 2017-10-31 MED ORDER — WITCH HAZEL-GLYCERIN EX PADS: 1.0000 "application " | MEDICATED_PAD | CUTANEOUS | Status: DC | PRN

## 2017-10-31 MED ORDER — FLEET ENEMA 7-19 GM/118ML RE ENEM: 1.0000 | ENEMA | RECTAL | Status: DC | PRN

## 2017-10-31 MED ORDER — FENTANYL 2.5 MCG/ML BUPIVACAINE 1/10 % EPIDURAL INFUSION (WH - ANES)
14.0000 mL/h | INTRAMUSCULAR | Status: DC | PRN
  Administered 2017-10-31: 14 mL/h via EPIDURAL

## 2017-10-31 MED ORDER — DIPHENHYDRAMINE HCL 25 MG PO CAPS: 25.0000 mg | ORAL_CAPSULE | Freq: Four times a day (QID) | ORAL | Status: DC | PRN

## 2017-10-31 MED ORDER — ONDANSETRON HCL 4 MG/2ML IJ SOLN: 4.0000 mg | INTRAMUSCULAR | Status: DC | PRN

## 2017-10-31 MED ORDER — EPHEDRINE 5 MG/ML INJ
10.0000 mg | INTRAVENOUS | Status: DC | PRN
  Filled 2017-10-31: qty 2

## 2017-10-31 MED ORDER — DIBUCAINE 1 % RE OINT: 1.0000 "application " | TOPICAL_OINTMENT | RECTAL | Status: DC | PRN

## 2017-10-31 MED ORDER — PHENYLEPHRINE 40 MCG/ML (10ML) SYRINGE FOR IV PUSH (FOR BLOOD PRESSURE SUPPORT)
PREFILLED_SYRINGE | INTRAVENOUS | Status: AC
  Filled 2017-10-31: qty 10

## 2017-10-31 MED ORDER — FENTANYL 2.5 MCG/ML BUPIVACAINE 1/10 % EPIDURAL INFUSION (WH - ANES)
INTRAMUSCULAR | Status: AC
  Filled 2017-10-31: qty 100

## 2017-10-31 MED ORDER — OXYCODONE-ACETAMINOPHEN 5-325 MG PO TABS: 1.0000 | ORAL_TABLET | ORAL | Status: DC | PRN

## 2017-10-31 MED ORDER — ACETAMINOPHEN 325 MG PO TABS: 650.0000 mg | ORAL_TABLET | ORAL | Status: DC | PRN

## 2017-10-31 MED ORDER — OXYTOCIN 40 UNITS IN LACTATED RINGERS INFUSION - SIMPLE MED: 2.5000 [IU]/h | INTRAVENOUS | Status: DC

## 2017-10-31 MED ORDER — ZOLPIDEM TARTRATE 5 MG PO TABS: 5.0000 mg | ORAL_TABLET | Freq: Every evening | ORAL | Status: DC | PRN

## 2017-10-31 MED ORDER — SOD CITRATE-CITRIC ACID 500-334 MG/5ML PO SOLN: 30.0000 mL | ORAL | Status: DC | PRN

## 2017-10-31 MED ORDER — LACTATED RINGERS IV SOLN
500.0000 mL | Freq: Once | INTRAVENOUS | Status: AC
  Administered 2017-10-31: 500 mL via INTRAVENOUS

## 2017-10-31 MED ORDER — PRENATAL MULTIVITAMIN CH
1.0000 | ORAL_TABLET | Freq: Every day | ORAL | Status: DC
  Administered 2017-11-01 – 2017-11-02 (×2): 1 via ORAL
  Filled 2017-10-31 (×2): qty 1

## 2017-10-31 MED ORDER — BENZOCAINE-MENTHOL 20-0.5 % EX AERO: 1.0000 "application " | INHALATION_SPRAY | CUTANEOUS | Status: DC | PRN

## 2017-10-31 MED ORDER — LIDOCAINE HCL (PF) 1 % IJ SOLN
INTRAMUSCULAR | Status: DC | PRN
  Administered 2017-10-31: 8 mL via EPIDURAL

## 2017-10-31 MED ORDER — TERBUTALINE SULFATE 1 MG/ML IJ SOLN
0.2500 mg | Freq: Once | INTRAMUSCULAR | Status: DC | PRN
  Filled 2017-10-31: qty 1

## 2017-10-31 MED ORDER — IBUPROFEN 600 MG PO TABS
600.0000 mg | ORAL_TABLET | Freq: Four times a day (QID) | ORAL | Status: DC
  Administered 2017-10-31 – 2017-11-02 (×7): 600 mg via ORAL
  Filled 2017-10-31 (×8): qty 1

## 2017-10-31 MED ORDER — SIMETHICONE 80 MG PO CHEW
80.0000 mg | CHEWABLE_TABLET | ORAL | Status: DC | PRN
Start: 2017-10-31 — End: 2017-11-02

## 2017-10-31 MED ORDER — LACTATED RINGERS IV SOLN: 500.0000 mL | INTRAVENOUS | Status: DC | PRN

## 2017-10-31 MED ORDER — OXYTOCIN 40 UNITS IN LACTATED RINGERS INFUSION - SIMPLE MED
1.0000 m[IU]/min | INTRAVENOUS | Status: DC
  Administered 2017-10-31: 2 m[IU]/min via INTRAVENOUS
  Filled 2017-10-31: qty 1000

## 2017-10-31 MED ORDER — COCONUT OIL OIL: 1.0000 "application " | TOPICAL_OIL | Status: DC | PRN

## 2017-10-31 MED ORDER — OXYCODONE-ACETAMINOPHEN 5-325 MG PO TABS: 2.0000 | ORAL_TABLET | ORAL | Status: DC | PRN

## 2017-10-31 MED ORDER — MISOPROSTOL 50MCG HALF TABLET
50.0000 ug | ORAL_TABLET | ORAL | Status: DC
  Administered 2017-10-31: 50 ug via ORAL
  Filled 2017-10-31 (×6): qty 1

## 2017-10-31 MED ORDER — ONDANSETRON HCL 4 MG PO TABS: 4.0000 mg | ORAL_TABLET | ORAL | Status: DC | PRN

## 2017-10-31 MED ORDER — LIDOCAINE HCL (PF) 1 % IJ SOLN
30.0000 mL | INTRAMUSCULAR | Status: DC | PRN
  Filled 2017-10-31: qty 30

## 2017-10-31 MED ORDER — OXYTOCIN BOLUS FROM INFUSION
500.0000 mL | Freq: Once | INTRAVENOUS | Status: AC
  Administered 2017-10-31: 500 mL via INTRAVENOUS

## 2017-10-31 MED ORDER — SENNOSIDES-DOCUSATE SODIUM 8.6-50 MG PO TABS
2.0000 | ORAL_TABLET | ORAL | Status: DC
  Administered 2017-10-31 – 2017-11-01 (×2): 2 via ORAL
  Filled 2017-10-31 (×2): qty 2

## 2017-10-31 MED ORDER — ONDANSETRON HCL 4 MG/2ML IJ SOLN: 4.0000 mg | Freq: Four times a day (QID) | INTRAMUSCULAR | Status: DC | PRN

## 2017-10-31 MED ORDER — DIPHENHYDRAMINE HCL 50 MG/ML IJ SOLN: 12.5000 mg | INTRAMUSCULAR | Status: DC | PRN

## 2017-10-31 MED ORDER — LACTATED RINGERS IV SOLN
INTRAVENOUS | Status: DC
  Administered 2017-10-31 (×2): via INTRAVENOUS

## 2017-10-31 MED ORDER — TETANUS-DIPHTH-ACELL PERTUSSIS 5-2.5-18.5 LF-MCG/0.5 IM SUSP: 0.5000 mL | Freq: Once | INTRAMUSCULAR | Status: DC

## 2017-10-31 MED ORDER — FENTANYL CITRATE (PF) 100 MCG/2ML IJ SOLN
100.0000 ug | INTRAMUSCULAR | Status: DC | PRN
  Administered 2017-10-31: 100 ug via INTRAVENOUS
  Filled 2017-10-31: qty 2

## 2017-10-31 MED ORDER — PHENYLEPHRINE 40 MCG/ML (10ML) SYRINGE FOR IV PUSH (FOR BLOOD PRESSURE SUPPORT)
80.0000 ug | PREFILLED_SYRINGE | INTRAVENOUS | Status: DC | PRN
  Filled 2017-10-31: qty 5

## 2017-10-31 MED ORDER — DIPHENHYDRAMINE HCL 50 MG/ML IJ SOLN
12.5000 mg | INTRAMUSCULAR | Status: DC | PRN
Start: 2017-10-31 — End: 2017-10-31

## 2017-10-31 NOTE — Anesthesia Procedure Notes (Signed)
Epidural Patient location during procedure: OB Start time: 10/31/2017 5:50 PM End time: 10/31/2017 6:05 PM  Staffing Anesthesiologist: Janeece Riggers, MD  Preanesthetic Checklist Completed: patient identified, site marked, surgical consent, pre-op evaluation, timeout performed, IV checked, risks and benefits discussed and monitors and equipment checked  Epidural Patient position: sitting Prep: site prepped and draped and DuraPrep Patient monitoring: continuous pulse ox and blood pressure Approach: midline Location: L3-L4 Injection technique: LOR air  Needle:  Needle type: Tuohy  Needle gauge: 17 G Needle length: 9 cm and 9 Needle insertion depth: 8 cm Catheter type: closed end flexible Catheter size: 19 Gauge Catheter at skin depth: 13 cm Test dose: negative  Assessment Events: blood not aspirated, injection not painful, no injection resistance, negative IV test and no paresthesia

## 2017-10-31 NOTE — Anesthesia Preprocedure Evaluation (Signed)
Anesthesia Evaluation  Patient identified by MRN, date of birth, ID band Patient awake    Reviewed: Allergy & Precautions, H&P , NPO status , Patient's Chart, lab work & pertinent test results, reviewed documented beta blocker date and time   Airway Mallampati: II  TM Distance: >3 FB Neck ROM: full    Dental no notable dental hx.    Pulmonary neg pulmonary ROS, former smoker,    Pulmonary exam normal breath sounds clear to auscultation       Cardiovascular Exercise Tolerance: Good hypertension, negative cardio ROS   Rhythm:regular Rate:Normal     Neuro/Psych negative neurological ROS  negative psych ROS   GI/Hepatic negative GI ROS, Neg liver ROS,   Endo/Other  Morbid obesity  Renal/GU negative Renal ROS  negative genitourinary   Musculoskeletal   Abdominal   Peds  Hematology negative hematology ROS (+)   Anesthesia Other Findings   Reproductive/Obstetrics negative OB ROS                             Anesthesia Physical Anesthesia Plan  ASA: II  Anesthesia Plan: General   Post-op Pain Management:    Induction:   PONV Risk Score and Plan:   Airway Management Planned:   Additional Equipment:   Intra-op Plan:   Post-operative Plan:   Informed Consent: I have reviewed the patients History and Physical, chart, labs and discussed the procedure including the risks, benefits and alternatives for the proposed anesthesia with the patient or authorized representative who has indicated his/her understanding and acceptance.   Dental Advisory Given  Plan Discussed with: CRNA  Anesthesia Plan Comments:         Anesthesia Quick Evaluation

## 2017-10-31 NOTE — Anesthesia Pain Management Evaluation Note (Signed)
  CRNA Pain Management Visit Note  Patient: Anita Potter, 32 y.o., female  "Hello I am a member of the anesthesia team at [facility]. We have an anesthesia team available at all times to provide care throughout the hospital, including epidural management and anesthesia for C-section. I don't know your plan for the delivery whether it a natural birth, water birth, IV sedation, nitrous supplementation, doula or epidural, but we want to meet your pain goals."   1.Was your pain managed to your expectations on prior hospitalizations?   Yes   2.What is your expectation for pain management during this hospitalization?     IV pain meds  3.How can we help you reach that goal? Be available  Record the patient's initial score and the patient's pain goal.   Pain: 0  Pain Goal: 8 [facility] wants you to be able to say your pain was always managed very well.  [name] 10/31/2017

## 2017-10-31 NOTE — H&P (Signed)
OBSTETRIC ADMISSION HISTORY AND PHYSICAL  Anita Potter is a 32 y.o. female G2P1001 with IUP at [redacted]w[redacted]d by LMP presenting for IOL for cHTN. She reports +FMs, No ContractionsNo LOF, no VB, no blurry vision, headaches or peripheral edema, no RUQ pain.  She plans on breast feeding. She is undecided for birth control.  She is undecided about an epidural as well. She had no complications during this pregnancy other than being diagnosed early on with cHTN.  She has been taking 81mg  Aspirin.  She received her prenatal care at [facility]:    @[redacted]w[redacted]d , CWD, normal anatomy, cephalic presentation,  [id number], 86% EFW   Prenatal History/Complications: cHTN, obesity, short interval pregnancy  Past Medical History: Past Medical History:  Diagnosis Date  . Hx of chlamydia infection 03/2016  . Hypertension   . Infection    UTI  . Ovarian cyst   . Postpartum hypertension 07/09/2016  . Uterine fibroid     Past Surgical History: Past Surgical History:  Procedure Laterality Date  . NO PAST SURGERIES      Obstetrical History: OB History    Gravida  2   Para  1   Term  1   Preterm      AB      Living  1     SAB      TAB      Ectopic      Multiple  0   Live Births  1           Social History: Social History   Socioeconomic History  . Marital status: Single    Spouse name: Not on file  . Number of children: 1  . Years of education: Not on file  . Highest education level: Bachelor's degree (e.g., BA, AB, BS)  Occupational History  . Not on file  Social Needs  . Financial resource strain: Not hard at all  . Food insecurity:    Worry: Never true    Inability: Never true  . Transportation needs:    Medical: No    Non-medical: No  Tobacco Use  . Smoking status: Former Smoker    Packs/day: 0.50    Types: Cigarettes    Last attempt to quit: 04/27/2015    Years since quitting: 2.5  . Smokeless tobacco: Never Used  Substance and Sexual Activity  . Alcohol use: Not  Currently    Comment: none since pregnancy  . Drug use: No  . Sexual activity: Yes  Lifestyle  . Physical activity:    Days per week: 3 days    Minutes per session: 30 min  . Stress: To some extent  Relationships  . Social connections:    Talks on phone: Once a week    Gets together: More than three times a week    Attends religious service: More than 4 times per year    Active member of club or organization: No    Attends meetings of clubs or organizations: Never    Relationship status: Living with partner  Other Topics Concern  . Not on file  Social History Narrative  . Not on file    Family History: Family History  Problem Relation Age of Onset  . Hypertension Mother   . Cancer Father        lung    Allergies: No Known Allergies  Medications Prior to Admission  Medication Sig Dispense Refill Last Dose  . aspirin EC 81 MG tablet Take 81 mg  by mouth daily.   10/30/2017 at Unknown time  . Prenatal Vit-Fe Fumarate-FA (PRENATAL MULTIVITAMIN) TABS tablet Take 1 tablet by mouth daily at 12 noon. (Patient taking differently: Take 1 tablet by mouth daily. ) 100 tablet 3 10/30/2017 at Unknown time     Review of Systems   All systems reviewed and negative except as stated in HPI  Blood pressure (!) 150/93, pulse 84, temperature 98 F (36.7 C), temperature source Oral, resp. rate 16, height 5\' 5"  (1.651 m), weight 114.3 kg, last menstrual period 01/31/2017, unknown if currently breastfeeding. General appearance: alert, cooperative, appears stated age and no distress Lungs: clear to auscultation bilaterally Heart: regular rate and rhythm Abdomen: soft, non-tender; bowel sounds normal Extremities: Homans sign is negative, no sign of DVT Presentation: cephalic Fetal monitoringBaseline: 140 bpm, Variability: Good {> 6 bpm), Accelerations: Reactive and Decelerations: Absent Uterine activityNone Dilation: 4 Effacement (%): 60 Station: -3 Exam by:: Sunday Corn CNM   Prenatal  labs: ABO, Rh: --/--/A POS (08/14 0747) Antibody: NEG (08/14 0747) Rubella: 1.98 (02/06 0953) RPR: Non Reactive (08/14 0747)  HBsAg: Negative (02/06 0953)  HIV: Non Reactive (05/28 1023)  GBS: Negative (07/24 0932)  1 hr Glucola 77/105/120 Genetic screening  declines Anatomy US normal  Prenatal Transfer Tool  Maternal Diabetes: No Genetic Screening: Declined Maternal Ultrasounds/Referrals: Normal Fetal Ultrasounds or other Referrals:  None Maternal Substance Abuse:  No Significant Maternal Medications:  None Significant Maternal Lab Results: Lab values include: Group B Strep negative  Results for orders placed or performed during the hospital encounter of 10/31/17 (from the past 24 hour(s))  CBC   Collection Time: 10/31/17  7:47 AM  Result Value Ref Range   WBC 8.4 4.0 - 10.5 K/uL   RBC 4.18 3.87 - 5.11 MIL/uL   Hemoglobin 12.5 12.0 - 15.0 g/dL   HCT 36.2 36.0 - 46.0 %   MCV 86.6 78.0 - 100.0 fL   MCH 29.9 26.0 - 34.0 pg   MCHC 34.5 30.0 - 36.0 g/dL   RDW 12.6 11.5 - 15.5 %   Platelets 241 150 - 400 K/uL  RPR   Collection Time: 10/31/17  7:47 AM  Result Value Ref Range   RPR Ser Ql Non Reactive Non Reactive  Type and screen   Collection Time: 10/31/17  7:47 AM  Result Value Ref Range   ABO/RH(D) A POS    Antibody Screen NEG    Sample Expiration      11/03/2017 Performed at [facility], [address] [place], [place] [address]     Patient Active Problem List   Diagnosis Date Noted  . Hypertension in pregnancy, antepartum 10/31/2017  . Obesity in pregnancy 10/03/2017  . Supervision of high risk pregnancy, antepartum 04/25/2017  . Chronic hypertension during pregnancy, antepartum 04/25/2017  . Obesity (BMI 35.0-39.9 without comorbidity) 04/25/2017  . Short interval between pregnancies affecting pregnancy, antepartum 04/25/2017  . Vertigo 08/15/2013    Assessment/Plan:  Anita Potter is a 32 y.o. G2P1001 at [redacted]w[redacted]d here for IOL for cHTN.    #Labor:IOL.  Will give cytotec.  Put in FB if necessary, followed by pitocin.  #Pain: Upon maternal request #FWB: Cat. I #ID:  gbs neg #MOF: breast #MOC:undecided #Circ:  inpatient  Benay Pike, MD  10/31/2017, 1:43 PM

## 2017-11-01 NOTE — Lactation Note (Signed)
This note was copied from a baby's chart. Lactation Consultation Note  Patient Name: Anita Potter [id number] Date: 11/01/2017 Reason for consult: Term;Initial assessment P2, 60 hr old female infant, Mom w/ CHTN. Not interested in applying for [facility] services at this time. Mom with previous BF experience, BF her 74 month old son for 7 months.  Mom is knowledgeable about STS. Per mom, has medela  DEBP at home. Per mom, had one soiled diaper since delivery. Mom latched infant w/ cross-cradle position infant has wide mouth, open gape and audible swallowing heard. Latch 9. Mom did Breast compressions and Breast massage as she was feeding  her baby.  Mom was still BF as LC left room, mom had been BF for 10 mins. Reviewed Baby & Me book's Breastfeeding Basics.  Mom encouraged to feed baby 8-12 times/24 hours and with feeding cues.  LC discussed I& O. Mom made aware of O/P services, breastfeeding support groups, community resources, and our phone # for post-discharge questions.  Maternal Data Formula Feeding for Exclusion: No Has patient been taught Hand Expression?: Yes Does the patient have breastfeeding experience prior to this delivery?: Yes  Feeding Feeding Type: Breast Fed Length of feed: 10 min(Mom , still BF as LC left room )  LATCH Score Latch: Grasps breast easily, tongue down, lips flanged, rhythmical sucking.  Audible Swallowing: Spontaneous and intermittent  Type of Nipple: Everted at rest and after stimulation  Comfort (Breast/Nipple): Soft / non-tender  Hold (Positioning): Assistance needed to correctly position infant at breast and maintain latch.  LATCH Score: 9  Interventions Interventions: Breast feeding basics reviewed;Assisted with latch;Skin to skin;Breast massage;Hand express;Position options;Support pillows;Hand pump  Lactation Tools Discussed/Used WIC Program: No   Consult Status Consult Status: Follow-up Date: 11/01/17 Follow-up type:  In-patient    Vicente Serene 11/01/2017, 12:20 AM

## 2017-11-01 NOTE — Anesthesia Postprocedure Evaluation (Signed)
Anesthesia Post Note  Patient: Anita Potter  Procedure(s) Performed: AN AD [place]     Patient location during evaluation: Mother Baby Anesthesia Type: Epidural Level of consciousness: awake Pain management: pain level controlled Vital Signs Assessment: post-procedure vital signs reviewed and stable Respiratory status: spontaneous breathing Cardiovascular status: stable Postop Assessment: epidural receding and patient able to bend at knees Anesthetic complications: no    Last Vitals:  Vitals:   10/31/17 2300 11/01/17 0400  BP: 127/88 120/81  Pulse: 90 84  Resp: 20 18  Temp: 37.2 C 36.6 C  SpO2:      Last Pain:  Vitals:   11/01/17 0400  TempSrc: Oral  PainSc:    Pain Goal:                 [name]

## 2017-11-01 NOTE — Lactation Note (Signed)
This note was copied from a baby's chart. Lactation Consultation Note  Patient Name: Anita Potter [id number] Date: 11/01/2017 Reason for consult: Term;Initial assessment   Maternal Data Formula Feeding for Exclusion: No Has patient been taught Hand Expression?: Yes Does the patient have breastfeeding experience prior to this delivery?: Yes  Feeding Feeding Type: Breast Fed Length of feed: 10 min(Mom , still BF as LC left room )  LATCH Score Latch: Grasps breast easily, tongue down, lips flanged, rhythmical sucking.  Audible Swallowing: Spontaneous and intermittent  Type of Nipple: Everted at rest and after stimulation  Comfort (Breast/Nipple): Soft / non-tender  Hold (Positioning): Assistance needed to correctly position infant at breast and maintain latch.  LATCH Score: 9  Interventions Interventions: Breast feeding basics reviewed;Assisted with latch;Skin to skin;Breast massage;Hand express;Position options;Support pillows;Hand pump  Lactation Tools Discussed/Used WIC Program: No   Consult Status Consult Status: Follow-up Date: 11/01/17 Follow-up type: In-patient    Vicente Serene 11/01/2017, 12:39 AM

## 2017-11-01 NOTE — Progress Notes (Signed)
Post Partum Day 1 Subjective: Patient is doing well with no complaints. She denies headache, changes in vision, RUQ pain, chest pain, SOB, or leg swelling. She reports that her bleeding is decreasing. She is having no trouble with urination and is ambulating well. She has not passed flatus or had a bowel movement yet. She is breast feeding and is undecided on method of contraception. She plans for inpatient circumcision.   Objective: Blood pressure 120/81, pulse 84, temperature 97.9 F (36.6 C), temperature source Oral, resp. rate 18, height 5\' 5"  (1.651 m), weight 114.3 kg, last menstrual period 01/31/2017, SpO2 99 %, unknown if currently breastfeeding.  Physical Exam:  General: alert and no distress Lochia: appropriate Uterine Fundus: firm Incision: n/a DVT Evaluation: No evidence of DVT seen on physical exam.  Recent Labs    10/31/17 0747 10/31/17 2123  HGB 12.5 11.6*  HCT 36.2 33.6*    Assessment/Plan: Brianni Glennon Mac is a 32 year-old G2P002 who is PPD #1 from [facility]. She is doing well and is in good condition. Blood pressures have been in the normal range since a few hours following delivery. Plan for inpatient circumcision. Plan to continue to monitor pressures throughout today and discharge tomorrow.    LOS: 1 day   Isabel Caprice 11/01/2017, 7:38 AM

## 2017-11-02 MED ORDER — NIFEDIPINE ER 30 MG PO TB24: 30.0000 mg | ORAL_TABLET | Freq: Every day | ORAL | 0 refills | Status: DC

## 2017-11-02 MED ORDER — NIFEDIPINE ER OSMOTIC RELEASE 30 MG PO TB24
30.0000 mg | ORAL_TABLET | Freq: Every day | ORAL | Status: DC
  Administered 2017-11-02: 30 mg via ORAL
  Filled 2017-11-02: qty 1

## 2017-11-02 MED ORDER — IBUPROFEN 600 MG PO TABS: 600.0000 mg | ORAL_TABLET | Freq: Four times a day (QID) | ORAL | 0 refills | Status: DC | PRN

## 2017-11-02 MED ORDER — ACETAMINOPHEN 325 MG PO TABS: 650.0000 mg | ORAL_TABLET | ORAL | Status: AC | PRN

## 2017-11-02 NOTE — Lactation Note (Signed)
This note was copied from a baby's chart. Lactation Consultation Note  Patient Name: Anita Potter [id number] Date: 11/02/2017 Reason for consult: Follow-up assessment;Nipple pain/trauma Mom states baby is feeding well but nipples are sore.  Nipples examined and both intact.  Right nipple is inverted.  Comfort gels given with instructions.  Discussed milk coming to volume and prevention and treatment of engorgement.  Stressed importance of obtaining a deep latch and holding baby close during feeding.  Lactation outpatient services and support reviewed and encouraged prn.  Maternal Data    Feeding    LATCH Score                   Interventions    Lactation Tools Discussed/Used     Consult Status Consult Status: Complete Follow-up type: Call as needed    Ave Filter 11/02/2017, 10:15 AM

## 2017-11-02 NOTE — Discharge Instructions (Signed)
Vaginal Delivery, Care After Refer to this sheet in the next few weeks. These discharge instructions provide you with information on caring for yourself after delivery. Your caregiver may also give you specific instructions. Your treatment has been planned according to the most current medical practices available, but problems sometimes occur. Call your caregiver if you have any problems or questions after you go home. HOME CARE INSTRUCTIONS 1. Take over-the-counter or prescription medicines only as directed by your caregiver or pharmacist. 2. Do not drink alcohol, especially if you are breastfeeding or taking medicine to relieve pain. 3. Do not smoke tobacco. 4. Continue to use good perineal care. Good perineal care includes: 1. Wiping your perineum from back to front 2. Keeping your perineum clean. 3. You can do sitz baths twice a day, to help keep this area clean 5. Do not use tampons, douche or have sex until your caregiver says it is okay. 6. Shower only and avoid sitting in submerged water, aside from sitz baths 7. Wear a well-fitting bra that provides breast support. 8. Eat healthy foods. 9. Drink enough fluids to keep your urine clear or pale yellow. 10. Eat high-fiber foods such as whole grain cereals and breads, brown rice, beans, and fresh fruits and vegetables every day. These foods may help prevent or relieve constipation. 11. Avoid constipation with high fiber foods or medications, such as miralax or metamucil 12. Follow your caregiver's recommendations regarding resumption of activities such as climbing stairs, driving, lifting, exercising, or traveling. 13. Talk to your caregiver about resuming sexual activities. Resumption of sexual activities is dependent upon your risk of infection, your rate of healing, and your comfort and desire to resume sexual activity. 14. Try to have someone help you with your household activities and your newborn for at least a few days after you leave  the hospital. 15. Rest as much as possible. Try to rest or take a nap when your newborn is sleeping. 16. Increase your activities gradually. 17. Keep all of your scheduled postpartum appointments. It is very important to keep your scheduled follow-up appointments. At these appointments, your caregiver will be checking to make sure that you are healing physically and emotionally. SEEK MEDICAL CARE IF:   You are passing large clots from your vagina. Save any clots to show your caregiver.  You have a foul smelling discharge from your vagina.  You have trouble urinating.  You are urinating frequently.  You have pain when you urinate.  You have a change in your bowel movements.  You have increasing redness, pain, or swelling near your vaginal incision (episiotomy) or vaginal tear.  You have pus draining from your episiotomy or vaginal tear.  Your episiotomy or vaginal tear is separating.  You have painful, hard, or reddened breasts.  You have a severe headache.  You have blurred vision or see spots.  You feel sad or depressed.  You have thoughts of hurting yourself or your newborn.  You have questions about your care, the care of your newborn, or medicines.  You are dizzy or light-headed.  You have a rash.  You have nausea or vomiting.  You were breastfeeding and have not had a menstrual period within 12 weeks after you stopped breastfeeding.  You are not breastfeeding and have not had a menstrual period by the 12th week after delivery.  You have a fever. SEEK IMMEDIATE MEDICAL CARE IF:   You have persistent pain.  You have chest pain.  You have shortness of breath.    You faint.  You have leg pain.  You have stomach pain.  Your vaginal bleeding saturates two or more sanitary pads in 1 hour. MAKE SURE YOU:   Understand these instructions.  Will watch your condition.  Will get help right away if you are not doing well or get worse. Document Released:  03/03/2000 Document Revised: 07/21/2013 Document Reviewed: 11/01/2011 [facility] Patient Information 2015 [place], [place]. This information is not intended to replace advice given to you by your health care provider. Make sure you discuss any questions you have with your health care provider.  Sitz Bath A sitz bath is a warm water bath taken in the sitting position. The water covers only the hips and butt (buttocks). We recommend using one that fits in the toilet, to help with ease of use and cleanliness. It may be used for either healing or cleaning purposes. Sitz baths are also used to relieve pain, itching, or muscle tightening (spasms). The water may contain medicine. Moist heat will help you heal and relax.  HOME CARE  Take 3 to 4 sitz baths a day. 18. Fill the bathtub half-full with warm water. 19. Sit in the water and open the drain a little. 20. Turn on the warm water to keep the tub half-full. Keep the water running constantly. 21. Soak in the water for 15 to 20 minutes. 22. After the sitz bath, pat the affected area dry. GET HELP RIGHT AWAY IF: You get worse instead of better. Stop the sitz baths if you get worse. MAKE SURE YOU:  Understand these instructions.  Will watch your condition.  Will get help right away if you are not doing well or get worse. Document Released: 04/13/2004 Document Revised: 11/29/2011 Document Reviewed: 07/04/2010 [facility] Patient Information 2015 [place], [place]. This information is not intended to replace advice given to you by your health care provider. Make sure you discuss any questions you have with your health care provider.    Hypertension During Pregnancy General instructions  Take over-the-counter and prescription medicines only as told by your doctor.  While lying down, lie on your left side. This keeps pressure off your baby.  While sitting or lying down, raise (elevate) your feet. Try putting some pillows under your lower  legs.  Exercise regularly. Ask your doctor what kinds of exercise are best for you.  Keep all prenatal and follow-up visits as told by your doctor. This is important. Contact a doctor if:  You have symptoms that your doctor told you to watch for, such as: ? Fever. ? Throwing up (vomiting). ? Headache. Get help right away if:  You have very bad pain in your belly (abdomen).  You are throwing up, and this does not get better with treatment.  You suddenly get swelling in your hands, ankles, or face.  You gain 4 lb (1.8 kg) or more in 1 week.  You get bleeding from your vagina.  You have blood in your pee.  You do not feel your baby moving as much as normal.  You have a change in vision.  You have muscle twitching or sudden tightening (spasms).  You have trouble breathing.  Your lips or fingernails turn blue. This information is not intended to replace advice given to you by your health care provider. Make sure you discuss any questions you have with your health care provider. Document Released: 04/08/2010 Document Revised: 11/16/2015 Document Reviewed: 11/16/2015 Elsevier Interactive Patient Education  [employer].

## 2017-11-02 NOTE — Discharge Summary (Signed)
Obstetrical Discharge Summary  Date of Admission: 10/31/2017 Date of Discharge: 11/02/2017  Primary OB: [facility]  Gestational Age at Delivery: [redacted]w[redacted]d   Antepartum complications: Chronic HTN, BMI 30s, short interval pregnancy Reason for Admission: IOL for cHTN Date of Delivery: 10/31/17  Delivered By: Laury Deep, CNM Delivery Type: spontaneous vaginal delivery Intrapartum complications/course: None Anesthesia: epidural Placenta: Delivered and expressed via active management. Intact: yes. To pathology: no.  Laceration: 1st degree (repaired) Episiotomy: none EBL: 254mL Baby: Liveborn female, APGARs 8/9, weight 3410 g.    Discharge Diagnosis: Delivered.  Postpartum course:  *PP: uncomplicated. *cHTN: patient had some mildly elevated BPs on PPD#2 and was started on procardia xl 30mg  qday and did well with this. She denied any s/s of pre-eclampsia  Discharge Vital Signs:  Current Vital Signs 24h Vital Sign Ranges  T 98 F (36.7 C) No data recorded  BP 140/82 BP  Min: 136/95  Max: 155/90  HR 82 Pulse  Avg: 80.8  Min: 76  Max: 83  RR 18 Resp  Avg: 18  Min: 18  Max: 18  SaO2 99 %   No data recorded       24 Hour I/O Current Shift I/O  Time Ins Outs No intake/output data recorded. No intake/output data recorded.   Patient Vitals for the past 6 hrs:  BP Pulse  11/02/17 1350 140/82 82  11/02/17 1115 (!) 136/95 76    Discharge Exam:  NAD Perineum: deferred Abdomen: firm fundus below the umbilicus, NTTP, non distended, +bowel sounds.   RRR no MRGs CTAB Ext: no c/c/e  Recent Labs  Lab 10/31/17 0747 10/31/17 2123  WBC 8.4 12.8*  HGB 12.5 11.6*  HCT 36.2 33.6*  PLT 241 209    Disposition: Home  Rh Immune globulin given: not applicable Rubella vaccine given: not applicable Tdap vaccine given in AP or PP setting: yes  Contraception: undecided  Prenatal/Postnatal Panel: A POS//Rubella Immune//Varicella Unknown//RPR negative//HIV  negative/HepB Surface Ag negative//pap no abnormalities (date: 10/2015)//plans to breastfeed  Plan:  Dalilah Curlin was discharged to home in good condition. Follow-up appointment with [place] in 1 week for a BP check visit  Future Appointments  Date Time Provider [place]  11/09/2017  2:30 PM Ty [place]  11/23/2017  2:15 PM Rasch, Artist Pais, NP [place] [facility]    Discharge Medications: Allergies as of 11/02/2017   No Known Allergies     Medication List    STOP taking these medications   aspirin EC 81 MG tablet     TAKE these medications   acetaminophen 325 MG tablet Commonly known as:  TYLENOL Take 2 tablets (650 mg total) by mouth every 4 (four) hours as needed (for pain scale < 4).   ibuprofen 600 MG tablet Commonly known as:  ADVIL,MOTRIN Take 1 tablet (600 mg total) by mouth every 6 (six) hours as needed.   NIFEdipine 30 MG 24 hr tablet Commonly known as:  PROCARDIA-XL/ADALAT CC Take 1 tablet (30 mg total) by mouth daily. Start taking on:  11/03/2017   prenatal multivitamin Tabs tablet Take 1 tablet by mouth daily at 12 noon. What changed:  when to take this       Durene Romans MD Attending [facility])

## 2017-11-07 ENCOUNTER — Other Ambulatory Visit: Payer: 59

## 2017-11-07 ENCOUNTER — Encounter: Payer: 59 | Admitting: Obstetrics and Gynecology

## 2017-11-09 ENCOUNTER — Ambulatory Visit (INDEPENDENT_AMBULATORY_CARE_PROVIDER_SITE_OTHER): Payer: 59 | Admitting: General Practice

## 2017-11-09 VITALS — BP 130/83 | HR 77 | Ht 65.0 in | Wt 229.0 lb

## 2017-11-09 DIAGNOSIS — Z0130 Encounter for examination of blood pressure without abnormal findings: Secondary | ICD-10-CM

## 2017-11-09 NOTE — Progress Notes (Signed)
Patient presents to office today for blood pressure check following delivery on 8/14. Patient denies headaches, dizziness or blurry vision. Trace to +1 edema noted in feet. Patient reports taking procardia daily.  Reviewed patient's BP with Noni Saupe, who states patient can continue medication & follow up at pp visit. Patient informed.

## 2017-11-13 NOTE — Progress Notes (Signed)
I agree with the RN's note and plan of care. I have reviewed the BP with the RN.  Lezlie Lye, NP 11/13/2017 8:47 AM

## 2017-11-14 ENCOUNTER — Other Ambulatory Visit: Payer: 59

## 2017-11-14 ENCOUNTER — Encounter: Payer: 59 | Admitting: Obstetrics and Gynecology

## 2017-11-23 ENCOUNTER — Ambulatory Visit: Payer: 59 | Admitting: Obstetrics and Gynecology

## 2017-12-12 ENCOUNTER — Ambulatory Visit (INDEPENDENT_AMBULATORY_CARE_PROVIDER_SITE_OTHER): Payer: 59 | Admitting: Obstetrics and Gynecology

## 2017-12-12 ENCOUNTER — Encounter: Payer: Self-pay | Admitting: Obstetrics and Gynecology

## 2017-12-12 VITALS — BP 139/86 | HR 67 | Ht 65.0 in | Wt 223.6 lb

## 2017-12-12 DIAGNOSIS — I10 Essential (primary) hypertension: Secondary | ICD-10-CM

## 2017-12-12 DIAGNOSIS — Z30011 Encounter for initial prescription of contraceptive pills: Secondary | ICD-10-CM

## 2017-12-12 MED ORDER — NIFEDIPINE ER OSMOTIC RELEASE 30 MG PO TB24: 30.0000 mg | ORAL_TABLET | Freq: Every day | ORAL | 3 refills | Status: DC

## 2017-12-12 MED ORDER — NORETHINDRONE 0.35 MG PO TABS: 1.0000 | ORAL_TABLET | Freq: Every day | ORAL | 4 refills | Status: DC

## 2017-12-12 NOTE — Progress Notes (Signed)
Subjective:     Anita Potter is a 32 y.o. female who presents for a postpartum visit. She is 6 weeks postpartum following a spontaneous vaginal delivery. I have fully reviewed the prenatal and intrapartum course. The delivery was at 65 gestational weeks. Outcome: spontaneous vaginal delivery. Anesthesia: epidural. Postpartum course has been uncomplicated. Baby's course has been uncomplicated. Baby is feeding by exclusively breast. Bleeding no bleeding. Bowel function is normal. Bladder function is normal. Patient is sexually active -- 1st time was 2-3 days ago, but she stopped because of discomfort. Contraception method is none. Postpartum depression screening: negative.  The following portions of the patient's history were reviewed and updated as appropriate: allergies, current medications, past family history, past medical history, past social history, past surgical history and problem list.  Review of Systems Constitutional: negative Eyes: negative Ears, nose, mouth, throat, and face: negative Respiratory: negative Cardiovascular: negative Gastrointestinal: negative Genitourinary:negative Integument/breast: positive for breast tenderness -- relieved with taking Tylenol once on 12/11/17 Hematologic/lymphatic: negative Musculoskeletal:negative Neurological: negative Behavioral/Psych: negative Endocrine: negative Allergic/Immunologic: negative   Objective:    There were no vitals taken for this visit.  General:  alert, cooperative and no distress   Breasts:  inspection negative, no nipple discharge or bleeding, no masses or nodularity palpable  Lungs: clear to auscultation bilaterally  Heart:  regular rate and rhythm, S1, S2 normal, no murmur, click, rub or gallop  Abdomen: soft, non-tender; bowel sounds normal; no masses,  no organomegaly   Vulva:  not evaluated  Vagina: not evaluated  Cervix:  not evaluated  Corpus: not examined  Adnexa:  not evaluated  Rectal Exam: Not  performed.        Assessment:     Normal postpartum exam. Pap smear not done at today's visit.   Plan:   1. Contraception: oral progesterone-only contraceptive ok to switch to combined OCPs in 3-4 months postpartum 2. Patient plans to continue breastfeeding/pumping when she returns to work on 12/24/17 3. Follow up in: 1 year for annual with Pap or as needed.

## 2017-12-12 NOTE — Progress Notes (Signed)
Pt states wants to do Birth Control Pills for Contraceptive. Pt states since yesterday her right breast has been hurting, pain meds have been helping. Pt is breast feeding.

## 2019-03-07 ENCOUNTER — Ambulatory Visit (HOSPITAL_COMMUNITY)
Admission: EM | Admit: 2019-03-07 | Discharge: 2019-03-07 | Disposition: A | Discharge status: Home / Self Care | Payer: Managed Care, Other (non HMO) | Attending: Emergency Medicine | Admitting: Emergency Medicine

## 2019-03-07 ENCOUNTER — Encounter (HOSPITAL_COMMUNITY): Payer: Self-pay

## 2019-03-07 ENCOUNTER — Other Ambulatory Visit: Payer: Self-pay

## 2019-03-07 DIAGNOSIS — S29011A Strain of muscle and tendon of front wall of thorax, initial encounter: Secondary | ICD-10-CM

## 2019-03-07 DIAGNOSIS — N611 Abscess of the breast and nipple: Secondary | ICD-10-CM

## 2019-03-07 MED ORDER — NAPROXEN 500 MG PO TABS: 500.0000 mg | ORAL_TABLET | Freq: Two times a day (BID) | ORAL | 0 refills | Status: DC

## 2019-03-07 MED ORDER — SULFAMETHOXAZOLE-TRIMETHOPRIM 800-160 MG PO TABS
1.0000 | ORAL_TABLET | Freq: Two times a day (BID) | ORAL | 0 refills | Status: AC
Start: 2019-03-07 — End: 2019-03-14

## 2019-03-07 NOTE — Discharge Instructions (Signed)
Warm compresses to abscess to promote further drainage.  Self isolate until covid results are back and negative.  Will notify you by phone of any positive findings. Your negative results will be sent through your MyChart.    Naproxen twice a day, regular stretching of chest wall to help with pain.  May need to follow up with dermatology if abscess recurs.  Return for any worsening of symptoms.

## 2019-03-07 NOTE — ED Triage Notes (Signed)
Pt. Presents a cyst on her left breast that she noticed 2 months ago. States her chest hurts thinks she pulled a muscle on Tuesday.

## 2019-03-07 NOTE — ED Provider Notes (Signed)
[facility]    CSN: [id number] Arrival date & time: 03/07/19  1519      History   Chief Complaint Chief Complaint  Patient presents with  . Cyst    left breast    HPI Anita Potter is a 33 y.o. female.   Anita Potter presents with complaints of draining abscess to left breast. Waxed and waned in size for the past 2 months. Two days ago had increased pain but then started draining again and pain has improved. No fevers. Has had abscess to right axilla in the past which had to be lanced. Also complaints of chest wall pain for the past week. Movement of her arms causes pain. No pain with breathing, shortness of breath , palpitations. She has two toddlers who she lifts regularly, no other injury. Has taken ibuprofen which does help some. Without contributing medical history.    ROS per HPI, negative if not otherwise mentioned.      Past Medical History:  Diagnosis Date  . Hx of chlamydia infection 03/2016  . Hypertension   . Infection    UTI  . Ovarian cyst   . Postpartum hypertension 07/09/2016  . Uterine fibroid     Patient Active Problem List   Diagnosis Date Noted  . Hypertension in pregnancy, antepartum 10/31/2017  . SVD (spontaneous vaginal delivery) 10/31/2017  . Obesity in pregnancy 10/03/2017  . Supervision of high risk pregnancy, antepartum 04/25/2017  . Chronic hypertension during pregnancy, antepartum 04/25/2017  . Obesity (BMI 35.0-39.9 without comorbidity) 04/25/2017  . Short interval between pregnancies affecting pregnancy, antepartum 04/25/2017  . Vertigo 08/15/2013    Past Surgical History:  Procedure Laterality Date  . NO PAST SURGERIES      OB History    Gravida  2   Para  2   Term  2   Preterm      AB      Living  2     SAB      TAB      Ectopic      Multiple  0   Live Births  2            Home Medications    Prior to Admission medications   Medication Sig Start Date End Date Taking?  Authorizing Provider  acetaminophen (TYLENOL) 325 MG tablet Take 2 tablets (650 mg total) by mouth every 4 (four) hours as needed (for pain scale < 4). 11/02/17   Aletha Halim, MD  ibuprofen (ADVIL,MOTRIN) 600 MG tablet Take 1 tablet (600 mg total) by mouth every 6 (six) hours as needed. 11/02/17   Aletha Halim, MD  naproxen (NAPROSYN) 500 MG tablet Take 1 tablet (500 mg total) by mouth 2 (two) times daily. 03/07/19   Zigmund Gottron, NP  NIFEdipine (PROCARDIA XL) 30 MG 24 hr tablet Take 1 tablet (30 mg total) by mouth daily. 12/12/17   Laury Deep, CNM  NIFEdipine (PROCARDIA-XL/ADALAT CC) 30 MG 24 hr tablet Take 1 tablet (30 mg total) by mouth daily. Patient not taking: Reported on 12/12/2017 11/03/17   Aletha Halim, MD  norethindrone (MICRONOR,CAMILA,ERRIN) 0.35 MG tablet Take 1 tablet (0.35 mg total) by mouth daily. 12/12/17   Laury Deep, CNM  Prenatal Vit-Fe Fumarate-FA (PRENATAL MULTIVITAMIN) TABS tablet Take 1 tablet by mouth daily at 12 noon. Patient taking differently: Take 1 tablet by mouth daily.  07/06/16   Bovard-Stuckert, Jeral Fruit, MD  sulfamethoxazole-trimethoprim (BACTRIM DS) 800-160 MG tablet Take 1 tablet by mouth 2 (two) times  daily for 7 days. 03/07/19 03/14/19  Zigmund Gottron, NP    Family History Family History  Problem Relation Age of Onset  . Hypertension Mother   . Cancer Father        lung    Social History Social History   Tobacco Use  . Smoking status: Former Smoker    Packs/day: 0.50    Types: Cigarettes    Quit date: 04/27/2015    Years since quitting: 3.8  . Smokeless tobacco: Never Used  Substance Use Topics  . Alcohol use: Not Currently    Comment: none since pregnancy  . Drug use: No     Allergies   Patient has no known allergies.   Review of Systems Review of Systems   Physical Exam Triage Vital Signs ED Triage Vitals [03/07/19 1601]  Enc Vitals Group     BP (!) 140/93     Pulse Rate 92     Resp 18     Temp 98.4 F  (36.9 C)     Temp Source Oral     SpO2 100 %     Weight 210 lb (95.3 kg)     Height      Head Circumference      Peak Flow      Pain Score 6     Pain Loc      Pain Edu?      Excl. in [place]?    No data found.  Updated Vital Signs BP (!) 140/93 (BP Location: Right Arm)   Pulse 92   Temp 98.4 F (36.9 C) (Oral)   Resp 18   Wt 210 lb (95.3 kg)   LMP 02/20/2019   SpO2 100%   Breastfeeding No   BMI 34.95 kg/m    Physical Exam Constitutional:      General: She is not in acute distress.    Appearance: She is well-developed.  Cardiovascular:     Rate and Rhythm: Normal rate.  Pulmonary:     Effort: Pulmonary effort is normal.  Chest:       Comments: Pain to bilateral pectoralis musculature which is triggered by upper extremity movement; non tender on palpation; abscess to left medial breast which is draining purulent drainage; tender; minimal remaining fluctuance  Skin:    General: Skin is warm and dry.  Neurological:     Mental Status: She is alert and oriented to person, place, and time.      UC Treatments / Results  Labs (all labs ordered are listed, but only abnormal results are displayed) Labs Reviewed - No data to display  EKG   Radiology No results found.  Procedures Procedures (including critical care time)  Medications Ordered in UC Medications - No data to display  Initial Impression / Assessment and Plan / UC Course  I have reviewed the triage vital signs and the nursing notes.  Pertinent labs & imaging results that were available during my care of the patient were reviewed by me and considered in my medical decision making (see chart for details).     No shortness of breath . No breathing pain. Pectoralis pain with engagement. Left breast abscess which is actively draining. Antibiotics provided. Stretching, pain management discussed. Return precautions provided. Patient verbalized understanding and agreeable to plan.   Final Clinical  Impressions(s) / UC Diagnoses   Final diagnoses:  Abscess of left breast  Muscle strain of chest wall, initial encounter     Discharge Instructions  Warm compresses to abscess to promote further drainage.  Self isolate until covid results are back and negative.  Will notify you by phone of any positive findings. Your negative results will be sent through your MyChart.    Naproxen twice a day, regular stretching of chest wall to help with pain.  May need to follow up with dermatology if abscess recurs.  Return for any worsening of symptoms.     ED Prescriptions    Medication Sig Dispense Auth. Provider   sulfamethoxazole-trimethoprim (BACTRIM DS) 800-160 MG tablet Take 1 tablet by mouth 2 (two) times daily for 7 days. 14 tablet Augusto Gamble B, NP   naproxen (NAPROSYN) 500 MG tablet Take 1 tablet (500 mg total) by mouth 2 (two) times daily. 30 tablet Zigmund Gottron, NP     PDMP not reviewed this encounter.   Zigmund Gottron, NP 03/07/19 [phone]

## 2019-05-13 ENCOUNTER — Other Ambulatory Visit: Payer: Self-pay

## 2019-05-13 ENCOUNTER — Inpatient Hospital Stay (HOSPITAL_COMMUNITY): Payer: BC Managed Care – PPO

## 2019-05-13 ENCOUNTER — Inpatient Hospital Stay (HOSPITAL_COMMUNITY)
Admission: EM | Admit: 2019-05-13 | Discharge: 2019-05-13 | Disposition: A | Discharge status: Home / Self Care | Payer: BC Managed Care – PPO | Attending: Obstetrics and Gynecology | Admitting: Obstetrics and Gynecology

## 2019-05-13 ENCOUNTER — Encounter (HOSPITAL_COMMUNITY): Payer: Self-pay | Admitting: Emergency Medicine

## 2019-05-13 DIAGNOSIS — O4691 Antepartum hemorrhage, unspecified, first trimester: Secondary | ICD-10-CM

## 2019-05-13 DIAGNOSIS — Z3A01 Less than 8 weeks gestation of pregnancy: Secondary | ICD-10-CM

## 2019-05-13 DIAGNOSIS — O3680X0 Pregnancy with inconclusive fetal viability, not applicable or unspecified: Secondary | ICD-10-CM | POA: Diagnosis not present

## 2019-05-13 DIAGNOSIS — Z79899 Other long term (current) drug therapy: Secondary | ICD-10-CM | POA: Insufficient documentation

## 2019-05-13 DIAGNOSIS — F17210 Nicotine dependence, cigarettes, uncomplicated: Secondary | ICD-10-CM | POA: Diagnosis not present

## 2019-05-13 DIAGNOSIS — O26891 Other specified pregnancy related conditions, first trimester: Secondary | ICD-10-CM | POA: Diagnosis not present

## 2019-05-13 DIAGNOSIS — O99331 Smoking (tobacco) complicating pregnancy, first trimester: Secondary | ICD-10-CM | POA: Insufficient documentation

## 2019-05-13 DIAGNOSIS — O4690 Antepartum hemorrhage, unspecified, unspecified trimester: Secondary | ICD-10-CM

## 2019-05-13 DIAGNOSIS — R109 Unspecified abdominal pain: Secondary | ICD-10-CM | POA: Diagnosis not present

## 2019-05-13 DIAGNOSIS — O209 Hemorrhage in early pregnancy, unspecified: Secondary | ICD-10-CM | POA: Diagnosis not present

## 2019-05-13 DIAGNOSIS — O161 Unspecified maternal hypertension, first trimester: Secondary | ICD-10-CM | POA: Diagnosis not present

## 2019-05-13 HISTORY — DX: Gestational (pregnancy-induced) hypertension without significant proteinuria, unspecified trimester: O13.9

## 2019-05-13 LAB — WET PREP, GENITAL
Sperm: NONE SEEN
Trich, Wet Prep: NONE SEEN
Yeast Wet Prep HPF POC: NONE SEEN

## 2019-05-13 LAB — URINALYSIS, ROUTINE W REFLEX MICROSCOPIC
Bacteria, UA: NONE SEEN
Bilirubin Urine: NEGATIVE
Glucose, UA: NEGATIVE mg/dL
Ketones, ur: NEGATIVE mg/dL
Leukocytes,Ua: NEGATIVE
Nitrite: NEGATIVE
Protein, ur: NEGATIVE mg/dL
Specific Gravity, Urine: 1.024 (ref 1.005–1.030)
pH: 6 (ref 5.0–8.0)

## 2019-05-13 LAB — CBC
HCT: 40.1 % (ref 36.0–46.0)
Hemoglobin: 13.5 g/dL (ref 12.0–15.0)
MCH: 29.3 pg (ref 26.0–34.0)
MCHC: 33.7 g/dL (ref 30.0–36.0)
MCV: 87 fL (ref 80.0–100.0)
Platelets: 268 10*3/uL (ref 150–400)
RBC: 4.61 MIL/uL (ref 3.87–5.11)
RDW: 12.5 % (ref 11.5–15.5)
WBC: 9.2 10*3/uL (ref 4.0–10.5)
nRBC: 0 % (ref 0.0–0.2)

## 2019-05-13 LAB — POC URINE PREG, ED: Preg Test, Ur: POSITIVE — AB

## 2019-05-13 LAB — HCG, QUANTITATIVE, PREGNANCY: hCG, Beta Chain, Quant, S: 106 m[IU]/mL — ABNORMAL HIGH (ref <5)

## 2019-05-13 MED ORDER — METRONIDAZOLE 500 MG PO TABS: 500.0000 mg | ORAL_TABLET | Freq: Two times a day (BID) | ORAL | 0 refills | Status: DC

## 2019-05-13 NOTE — Discharge Instructions (Signed)
Bacterial Vaginosis  Bacterial vaginosis is a vaginal infection that occurs when the normal balance of bacteria in the vagina is disrupted. It results from an overgrowth of certain bacteria. This is the most common vaginal infection among women ages 15-44. Because bacterial vaginosis increases your risk for STIs (sexually transmitted infections), getting treated can help reduce your risk for chlamydia, gonorrhea, herpes, and HIV (human immunodeficiency virus). Treatment is also important for preventing complications in pregnant women, because this condition can cause an early (premature) delivery. What are the causes? This condition is caused by an increase in harmful bacteria that are normally present in small amounts in the vagina. However, the reason that the condition develops is not fully understood. What increases the risk? The following factors may make you more likely to develop this condition:  Having a new sexual partner or multiple sexual partners.  Having unprotected sex.  Douching.  Having an intrauterine device (IUD).  Smoking.  Drug and alcohol abuse.  Taking certain antibiotic medicines.  Being pregnant. You cannot get bacterial vaginosis from toilet seats, bedding, swimming pools, or contact with objects around you. What are the signs or symptoms? Symptoms of this condition include:  Grey or white vaginal discharge. The discharge can also be watery or foamy.  A fish-like odor with discharge, especially after sexual intercourse or during menstruation.  Itching in and around the vagina.  Burning or pain with urination. Some women with bacterial vaginosis have no signs or symptoms. How is this diagnosed? This condition is diagnosed based on:  Your medical history.  A physical exam of the vagina.  Testing a sample of vaginal fluid under a microscope to look for a large amount of bad bacteria or abnormal cells. Your health care provider may use a cotton swab or  a small wooden spatula to collect the sample. How is this treated? This condition is treated with antibiotics. These may be given as a pill, a vaginal cream, or a medicine that is put into the vagina (suppository). If the condition comes back after treatment, a second round of antibiotics may be needed. Follow these instructions at home: Medicines  Take over-the-counter and prescription medicines only as told by your health care provider.  Take or use your antibiotic as told by your health care provider. Do not stop taking or using the antibiotic even if you start to feel better. General instructions  If you have a female sexual partner, tell her that you have a vaginal infection. She should see her health care provider and be treated if she has symptoms. If you have a female sexual partner, he does not need treatment.  During treatment: ? Avoid sexual activity until you finish treatment. ? Do not douche. ? Avoid alcohol as directed by your health care provider. ? Avoid breastfeeding as directed by your health care provider.  Drink enough water and fluids to keep your urine clear or pale yellow.  Keep the area around your vagina and rectum clean. ? Wash the area daily with warm water. ? Wipe yourself from front to back after using the toilet.  Keep all follow-up visits as told by your health care provider. This is important. How is this prevented?  Do not douche.  Wash the outside of your vagina with warm water only.  Use protection when having sex. This includes latex condoms and dental dams.  Limit how many sexual partners you have. To help prevent bacterial vaginosis, it is best to have sex with just one partner (  monogamous).  Make sure you and your sexual partner are tested for STIs.  Wear cotton or cotton-lined underwear.  Avoid wearing tight pants and pantyhose, especially during summer.  Limit the amount of alcohol that you drink.  Do not use any products that contain  nicotine or tobacco, such as cigarettes and e-cigarettes. If you need help quitting, ask your health care provider.  Do not use illegal drugs. Where to find more information  Centers for Disease Control and Prevention: www.cdc.gov/std  American Sexual Health Association (ASHA): www.ashastd.org  U.S. Department of Health and Human Services, Office on Women's Health: www.womenshealth.gov/ or https://www.womenshealth.gov/a-z-topics/bacterial-vaginosis Contact a health care provider if:  Your symptoms do not improve, even after treatment.  You have more discharge or pain when urinating.  You have a fever.  You have pain in your abdomen.  You have pain during sex.  You have vaginal bleeding between periods. Summary  Bacterial vaginosis is a vaginal infection that occurs when the normal balance of bacteria in the vagina is disrupted.  Because bacterial vaginosis increases your risk for STIs (sexually transmitted infections), getting treated can help reduce your risk for chlamydia, gonorrhea, herpes, and HIV (human immunodeficiency virus). Treatment is also important for preventing complications in pregnant women, because the condition can cause an early (premature) delivery.  This condition is treated with antibiotic medicines. These may be given as a pill, a vaginal cream, or a medicine that is put into the vagina (suppository). This information is not intended to replace advice given to you by your health care provider. Make sure you discuss any questions you have with your health care provider. Document Revised: 02/16/2017 Document Reviewed: 11/20/2015 Elsevier Patient Education  2020 [employer]  

## 2019-05-13 NOTE — ED Notes (Signed)
[facility].

## 2019-05-13 NOTE — ED Notes (Signed)
[facility], transport to transfer pt to unit.

## 2019-05-13 NOTE — MAU Note (Signed)
Been spotting for a few days, started getting really bad cramps on Friday, was expecting period, but never came on. +HPT x2, confirmed in ED.  Back pain, lower rt side, started yesterday, only happens when she moves a certain way.

## 2019-05-13 NOTE — ED Provider Notes (Signed)
MSE was initiated and I personally evaluated the patient and placed orders (if any) at  7:58 AM on May 13, 2019.  The patient appears stable so that the remainder of the MSE may be completed by another provider.  Patient placed in Quick Look pathway, seen and evaluated   Chief Complaint: Vaginal bleeding  HPI:   34 year old female presenting to the ED with spotting for the past 2 days.  She had 2+ pregnancy test in the past 2 days.  States that she did not experience spotting with her prior 2 pregnancies.  Also endorses intermittent right lower back pain.  Has had abdominal cramping as well but this has significantly improved.  Denies any vaginal discharge, fever, vomiting, diarrhea or shortness of breath.  ROS: Vaginal bleeding (one)  Physical Exam:   Gen: No distress  Neuro: Awake and Alert  Skin: Warm    Focused Exam: Abdomen is soft, nontender nondistended.   Initiation of care has begun. The patient has been counseled on the process, plan, and necessity for staying for the completion/evaluation, and the remainder of the medical screening examination. I have called over to the MAU who accepts the patient in transfer.  She has a positive pregnancy test today.    Delia Heady, PA-C 05/13/19 0800    Tegeler, Gwenyth Allegra, MD 05/13/19 [phone]

## 2019-05-13 NOTE — ED Triage Notes (Signed)
Pt endorses positive pregnancy test Saturday and Sunday. Pt has been spotting since Friday. Pt had abd cramping on Friday but that has relieved. Pt is having intermittent right lower back pain.

## 2019-05-13 NOTE — MAU Provider Note (Signed)
History     CSN: [id number]  Arrival date and time: 05/13/19 [id number]   First Provider Initiated Contact with Patient 05/13/19 [phone]      Chief Complaint  Patient presents with  . Vaginal Bleeding  . Abdominal Pain  . Possible Pregnancy   Anita Potter is a 34 y.o. G3P2002 at 4.3 weeks by Definite LMP.  She presents today for Vaginal Bleeding, Abdominal Pain, and Possible Pregnancy.  She states she has been having back pain in her lower right side that started yesterday morning.  She initially contributed it to sleeping incorrectly, but it has been consistently throughout the day.  Patient describes the pain as sharp shooting that only occurs with movement.  She states she has not tried anything for the pain.  She reports having a positive pregnancy test on Saturday and Sunday.   Patient is currently not on any birth control. Patient denies sexual activity in the past 2 days and reports that she has been spotting.  Patient denies vaginal discharge, itching, burning, or odor.          OB History    Gravida  3   Para  2   Term  2   Preterm      AB      Living  2     SAB      TAB      Ectopic      Multiple  0   Live Births  2           Past Medical History:  Diagnosis Date  . Hx of chlamydia infection 03/2016  . Hypertension   . Infection    UTI  . Ovarian cyst   . Postpartum hypertension 07/09/2016  . Pregnancy induced hypertension    meds PP for 6-8wks  . Uterine fibroid     Past Surgical History:  Procedure Laterality Date  . NO PAST SURGERIES      Family History  Problem Relation Age of Onset  . Hypertension Mother   . Cancer Father        lung    Social History   Tobacco Use  . Smoking status: Current Every Day Smoker    Packs/day: 0.50    Types: Cigarettes  . Smokeless tobacco: Never Used  Substance Use Topics  . Alcohol use: Not Currently    Comment: none since pregnancy  . Drug use: No    Allergies: No Known  Allergies  Medications Prior to Admission  Medication Sig Dispense Refill Last Dose  . acetaminophen (TYLENOL) 325 MG tablet Take 2 tablets (650 mg total) by mouth every 4 (four) hours as needed (for pain scale < 4). (Patient taking differently: Take 650 mg by mouth every 4 (four) hours as needed for mild pain or headache. ) 30 tablet  05/10/2019  . clindamycin (CLEOCIN T) 1 % lotion Apply 1 application topically daily as needed.   05/12/2019 at Unknown time  . doxycycline (VIBRAMYCIN) 50 MG capsule Take 50 mg by mouth daily.   05/04/2019  . Prenatal Vit-Fe Fumarate-FA (PRENATAL MULTIVITAMIN) TABS tablet Take 1 tablet by mouth daily at 12 noon. (Patient taking differently: Take 1 tablet by mouth daily. ) 100 tablet 3 05/12/2019 at Unknown time  . ibuprofen (ADVIL,MOTRIN) 600 MG tablet Take 1 tablet (600 mg total) by mouth every 6 (six) hours as needed. (Patient not taking: Reported on 05/13/2019) 30 tablet 0 Not Taking at Unknown time  . naproxen (NAPROSYN) 500 MG tablet Take  1 tablet (500 mg total) by mouth 2 (two) times daily. (Patient not taking: Reported on 05/13/2019) 30 tablet 0 Not Taking at Unknown time  . NIFEdipine (PROCARDIA XL) 30 MG 24 hr tablet Take 1 tablet (30 mg total) by mouth daily. (Patient not taking: Reported on 05/13/2019) 30 tablet 3 Not Taking at Unknown time  . NIFEdipine (PROCARDIA-XL/ADALAT CC) 30 MG 24 hr tablet Take 1 tablet (30 mg total) by mouth daily. (Patient not taking: Reported on 12/12/2017) 45 tablet 0   . norethindrone (MICRONOR,CAMILA,ERRIN) 0.35 MG tablet Take 1 tablet (0.35 mg total) by mouth daily. (Patient not taking: Reported on 05/13/2019) 1 Package 4 Not Taking at Unknown time    Review of Systems  Constitutional: Negative for chills and fever.  Respiratory: Negative for cough and shortness of breath.   Gastrointestinal: Positive for abdominal pain ("it's not bad..like 2-3.") and nausea. Negative for vomiting.  Genitourinary: Positive for pelvic pain ("Feels  like menstrual cramps.") and vaginal bleeding [employer]). Negative for difficulty urinating, dysuria and vaginal discharge.  Musculoskeletal: Positive for back pain.  Neurological: Negative for dizziness, light-headedness and headaches.   Physical Exam   Blood pressure 137/84, pulse 69, temperature 98.3 F (36.8 C), temperature source Oral, resp. rate 16, height 5\' 5"  (1.651 m), weight 93.8 kg, last menstrual period 04/12/2019, SpO2 100 %, not currently breastfeeding.  Physical Exam  Constitutional: She is oriented to person, place, and time. She appears well-developed. No distress.  HENT:  Head: Normocephalic and atraumatic.  Eyes: Conjunctivae are normal.  Cardiovascular: Normal rate, regular rhythm and normal heart sounds.  Respiratory: Effort normal and breath sounds normal.  GI: Soft. Bowel sounds are normal. There is no abdominal tenderness. There is no CVA tenderness.  Genitourinary: Uterus is not enlarged. Cervix exhibits no motion tenderness and no discharge.    Vaginal bleeding present.  There is bleeding in the vagina.    Genitourinary Comments: Speculum Exam: -Normal External Genitalia: Non tender, no apparent discharge at introitus.  -Vaginal Vault: Pink mucoss. Small amt dark brown discharge/blood -wet prep collected -Cervix:Pink, no lesions, cysts, or polyps.  Appears closed. No active bleeding from os-GC/CT collected -Bimanual Exam:  No tenderness or apparent enlargement, but difficult to assess d/t panus.     Musculoskeletal:        General: Normal range of motion.     Cervical back: Normal range of motion.  Neurological: She is alert and oriented to person, place, and time.  Skin: Skin is warm and dry.  Psychiatric: She has a normal mood and affect. Her behavior is normal.    MAU Course  Procedures Results for orders placed or performed during the hospital encounter of 05/13/19 (from the past 24 hour(s))  POC Urine Pregnancy, ED (not at [facility])     Status:  Abnormal   Collection Time: 05/13/19  7:32 AM  Result Value Ref Range   Preg Test, Ur POSITIVE (A) NEGATIVE  Urinalysis, Routine w reflex microscopic     Status: Abnormal   Collection Time: 05/13/19  9:41 AM  Result Value Ref Range   Color, Urine YELLOW YELLOW   APPearance CLEAR CLEAR   Specific Gravity, Urine 1.024 1.005 - 1.030   pH 6.0 5.0 - 8.0   Glucose, UA NEGATIVE NEGATIVE mg/dL   Hgb urine dipstick SMALL (A) NEGATIVE   Bilirubin Urine NEGATIVE NEGATIVE   Ketones, ur NEGATIVE NEGATIVE mg/dL   Protein, ur NEGATIVE NEGATIVE mg/dL   Nitrite NEGATIVE NEGATIVE   Leukocytes,Ua NEGATIVE NEGATIVE  RBC / HPF 0-5 0 - 5 RBC/hpf   WBC, UA 0-5 0 - 5 WBC/hpf   Bacteria, UA NONE SEEN NONE SEEN   Squamous Epithelial / LPF 0-5 0 - 5   Mucus PRESENT   CBC     Status: None   Collection Time: 05/13/19 10:35 AM  Result Value Ref Range   WBC 9.2 4.0 - 10.5 K/uL   RBC 4.61 3.87 - 5.11 MIL/uL   Hemoglobin 13.5 12.0 - 15.0 g/dL   HCT 40.1 36.0 - 46.0 %   MCV 87.0 80.0 - 100.0 fL   MCH 29.3 26.0 - 34.0 pg   MCHC 33.7 30.0 - 36.0 g/dL   RDW 12.5 11.5 - 15.5 %   Platelets 268 150 - 400 K/uL   nRBC 0.0 0.0 - 0.2 %  hCG, quantitative, pregnancy     Status: Abnormal   Collection Time: 05/13/19 10:35 AM  Result Value Ref Range   hCG, Beta Chain, Quant, S 106 (H) <5 mIU/mL  Wet prep, genital     Status: Abnormal   Collection Time: 05/13/19 10:53 AM   Specimen: PATH Cytology Cervicovaginal Ancillary Only  Result Value Ref Range   Yeast Wet Prep HPF POC NONE SEEN NONE SEEN   Trich, Wet Prep NONE SEEN NONE SEEN   Clue Cells Wet Prep HPF POC PRESENT (A) NONE SEEN   WBC, Wet Prep HPF POC MANY (A) NONE SEEN   Sperm NONE SEEN    US OB LESS THAN 14 WEEKS WITH OB TRANSVAGINAL  Result Date: 05/13/2019 CLINICAL DATA:  Cramping, spotting, positive urine pregnancy test. EXAM: OBSTETRIC <14 WK [place] AND TRANSVAGINAL OB US TECHNIQUE: Both transabdominal and transvaginal ultrasound examinations were  performed for complete evaluation of the gestation as well as the maternal uterus, adnexal regions, and pelvic cul-de-sac. Transvaginal technique was performed to assess early pregnancy. COMPARISON:  None. FINDINGS: Intrauterine gestational sac: None Yolk sac:  Not Visualized. Embryo:  Not Visualized. Cardiac Activity: Not Visualized. Maternal uterus/adnexae: Anteverted uterus. Prominent endometrial stripe measuring 13 mm in thickness. Unremarkable right ovary measuring 3.9 x 2.6 x 2.8 cm. Unremarkable left ovary measuring 4.4 x 2.1 x 2.4 cm. No adnexal mass. No free fluid within the pelvis. IMPRESSION: No sonographic evidence of intrauterine pregnancy. Recommend close clinical follow-up, short interval repeat exam, and serial beta HCG levels. Electronically Signed   By: Davina Poke D.O.   On: 05/13/2019 10:45    MDM Pelvic Exam; Wet Prep and GC/CT Labs: UA, UPT, CBC, hCG, ABO Ultrasound  Assessment and Plan  34 year old G3P2002 at 4.3weeks Abdominal Pain Vaginal Bleeding  -Reviewed POC with patient. -Exam performed and findings discussed.  -Patient offered and declines pain medication.  -Cultures collected and pending.  -Labs ordered. -Will send for [place] and await results.   Maryann Conners 05/13/2019, 9:44 AM   Reassessment (11:38 AM) Bacterial Vaginosis  -[place] and lab results return as above. -Patient informed of need for follow up quant in 48 hours. -Patient states she received care for previous delivery at [facility] office. -Patient scheduled for Thursday Feb 25th at 1430 -Bleed Precautions given. -Encouraged to call or return to MAU if symptoms worsen or with the onset of new symptoms. -Discharged to home in stable condition.  Maryann Conners MSN, CNM Advanced Practice Provider, [facility]

## 2019-05-13 NOTE — MAU Note (Signed)
Sent from ER. Early preg, cramping

## 2019-05-13 NOTE — ED Notes (Signed)
Pts urine at bedside for further tests if needed.

## 2019-05-14 ENCOUNTER — Other Ambulatory Visit: Payer: Self-pay | Admitting: Obstetrics and Gynecology

## 2019-05-14 LAB — GC/CHLAMYDIA PROBE AMP (~~LOC~~) NOT AT ARMC
Chlamydia: NEGATIVE
Comment: NEGATIVE
Comment: NORMAL
Neisseria Gonorrhea: NEGATIVE

## 2019-05-15 ENCOUNTER — Ambulatory Visit (INDEPENDENT_AMBULATORY_CARE_PROVIDER_SITE_OTHER): Payer: BC Managed Care – PPO

## 2019-05-15 ENCOUNTER — Other Ambulatory Visit: Payer: Self-pay

## 2019-05-15 DIAGNOSIS — O3680X0 Pregnancy with inconclusive fetal viability, not applicable or unspecified: Secondary | ICD-10-CM

## 2019-05-15 LAB — BETA HCG QUANT (REF LAB): hCG Quant: 177 m[IU]/mL

## 2019-05-15 NOTE — Progress Notes (Signed)
Pt here today for STAT Beta Lab.  Pt stated that she is here today to see if she could be possibly miscarrying.  Pt reports that she is having continued vaginal spotting that she has been having since last Friday.  Pt denies any pain.  Pt advised that we will call her with results and f/u by 5p today.  Pt verbalized understanding.

## 2019-05-15 NOTE — Progress Notes (Signed)
Reviewed results with Marcille Buffy who finds rising quants but patient should have follow up quant in 48 hours for ongoing reassurance.   Called patient & informed her of results and follow up recommendation. Ectopic precautions reviewed with patient. Patient verbalized understanding.  Koren Bound RN BSN 05/15/19

## 2019-05-17 ENCOUNTER — Encounter (HOSPITAL_COMMUNITY): Payer: Self-pay | Admitting: Obstetrics & Gynecology

## 2019-05-17 ENCOUNTER — Other Ambulatory Visit: Payer: Self-pay

## 2019-05-17 ENCOUNTER — Inpatient Hospital Stay (HOSPITAL_COMMUNITY)
Admission: AD | Admit: 2019-05-17 | Discharge: 2019-05-17 | Disposition: A | Discharge status: Home / Self Care | Payer: BC Managed Care – PPO | Attending: Obstetrics & Gynecology | Admitting: Obstetrics & Gynecology

## 2019-05-17 ENCOUNTER — Inpatient Hospital Stay (HOSPITAL_COMMUNITY): Payer: BC Managed Care – PPO

## 2019-05-17 DIAGNOSIS — Z3A01 Less than 8 weeks gestation of pregnancy: Secondary | ICD-10-CM | POA: Insufficient documentation

## 2019-05-17 DIAGNOSIS — O209 Hemorrhage in early pregnancy, unspecified: Secondary | ICD-10-CM | POA: Diagnosis present

## 2019-05-17 DIAGNOSIS — I10 Essential (primary) hypertension: Secondary | ICD-10-CM

## 2019-05-17 DIAGNOSIS — O99331 Smoking (tobacco) complicating pregnancy, first trimester: Secondary | ICD-10-CM | POA: Insufficient documentation

## 2019-05-17 DIAGNOSIS — O26891 Other specified pregnancy related conditions, first trimester: Secondary | ICD-10-CM | POA: Diagnosis not present

## 2019-05-17 DIAGNOSIS — O3680X0 Pregnancy with inconclusive fetal viability, not applicable or unspecified: Secondary | ICD-10-CM

## 2019-05-17 DIAGNOSIS — F17210 Nicotine dependence, cigarettes, uncomplicated: Secondary | ICD-10-CM | POA: Insufficient documentation

## 2019-05-17 LAB — COMPREHENSIVE METABOLIC PANEL
ALT: 12 U/L (ref 0–44)
AST: 16 U/L (ref 15–41)
Albumin: 3.8 g/dL (ref 3.5–5.0)
Alkaline Phosphatase: 63 U/L (ref 38–126)
Anion gap: 9 (ref 5–15)
BUN: 11 mg/dL (ref 6–20)
CO2: 25 mmol/L (ref 22–32)
Calcium: 9.1 mg/dL (ref 8.9–10.3)
Chloride: 104 mmol/L (ref 98–111)
Creatinine, Ser: 0.65 mg/dL (ref 0.44–1.00)
GFR calc Af Amer: >60 mL/min (ref >60)
GFR calc non Af Amer: >60 mL/min (ref >60)
Glucose, Bld: 88 mg/dL (ref 70–99)
Potassium: 3.9 mmol/L (ref 3.5–5.1)
Sodium: 138 mmol/L (ref 135–145)
Total Bilirubin: 0.6 mg/dL (ref 0.3–1.2)
Total Protein: 6.8 g/dL (ref 6.5–8.1)

## 2019-05-17 LAB — HCG, QUANTITATIVE, PREGNANCY: hCG, Beta Chain, Quant, S: 264 m[IU]/mL — ABNORMAL HIGH (ref <5)

## 2019-05-17 MED ORDER — METHOTREXATE FOR ECTOPIC PREGNANCY: 50.0000 mg/m2 | Freq: Once | INTRAMUSCULAR | Status: DC

## 2019-05-17 NOTE — MAU Note (Signed)
Anita Potter is a 34 y.o. at [redacted]w[redacted]d here in MAU reporting:  For follow up HCG. Does report and increase in vaginal bleeding. More then spotting now and red in color. Having to wear pad. Onset of complaint: started 1 hour ago.  +pelvic pain. Has not taken anything for the pain. Constant.  Pain score: 5/10 Vitals:   05/17/19 1259  BP: (!) 133/93  Pulse: 73  Resp: 16  Temp: 98.7 F (37.1 C)  SpO2: 99%     Lab orders placed from triage: none

## 2019-05-17 NOTE — MAU Provider Note (Signed)
Patient Anita Potter is a 34 y.o. G3P2002  at [redacted]w[redacted]d here for follow up bHCG. Patient reports increased pain and bleeding; she denies constipation, SOB, fever. She was seen in MAU on 2/23; quant that day was 106, follow up on 2/25 was 177.   She is tearful and worried.   History     CSN: [id number]  Arrival date and time: 05/17/19 1227   None     Chief Complaint  Patient presents with  . Follow-up   Vaginal Bleeding The patient's primary symptoms include vaginal bleeding. The patient's pertinent negatives include no genital itching, genital lesions or genital odor. This is a new problem. The problem occurs constantly. Associated symptoms include abdominal pain. Pertinent negatives include no chills or constipation. The vaginal discharge was bloody.  Abdominal Pain This is a new problem. The current episode started today. The pain is located in the suprapubic region. The pain is at a severity of 5/10. The quality of the pain is cramping. The abdominal pain radiates to the back. Pertinent negatives include no constipation.    OB History    Gravida  3   Para  2   Term  2   Preterm      AB      Living  2     SAB      TAB      Ectopic      Multiple  0   Live Births  2           Past Medical History:  Diagnosis Date  . Hx of chlamydia infection 03/2016  . Hypertension   . Infection    UTI  . Ovarian cyst   . Postpartum hypertension 07/09/2016  . Pregnancy induced hypertension    meds PP for 6-8wks  . Uterine fibroid     Past Surgical History:  Procedure Laterality Date  . NO PAST SURGERIES      Family History  Problem Relation Age of Onset  . Hypertension Mother   . Cancer Father        lung    Social History   Tobacco Use  . Smoking status: Current Every Day Smoker    Packs/day: 0.50    Types: Cigarettes  . Smokeless tobacco: Never Used  Substance Use Topics  . Alcohol use: Not Currently    Comment: none since pregnancy  . Drug use:  No    Allergies: No Known Allergies  Medications Prior to Admission  Medication Sig Dispense Refill Last Dose  . acetaminophen (TYLENOL) 325 MG tablet Take 2 tablets (650 mg total) by mouth every 4 (four) hours as needed (for pain scale < 4). (Patient taking differently: Take 650 mg by mouth every 4 (four) hours as needed for mild pain or headache. ) 30 tablet    . metroNIDAZOLE (FLAGYL) 500 MG tablet Take 1 tablet (500 mg total) by mouth 2 (two) times daily. 14 tablet 0   . Prenatal Vit-Fe Fumarate-FA (PRENATAL MULTIVITAMIN) TABS tablet Take 1 tablet by mouth daily at 12 noon. (Patient taking differently: Take 1 tablet by mouth daily. ) 100 tablet 3     Review of Systems  Constitutional: Negative.  Negative for chills.  HENT: Negative.   Respiratory: Negative.   Gastrointestinal: Positive for abdominal pain. Negative for constipation.  Genitourinary: Positive for vaginal bleeding.  Musculoskeletal: Negative.   Psychiatric/Behavioral: Negative.    Physical Exam   Blood pressure (!) 133/93, pulse 73, temperature 98.7 F (37.1 C), temperature source  Oral, resp. rate 16, last menstrual period 04/12/2019, SpO2 99 %, not currently breastfeeding.  Physical Exam  Constitutional: She appears well-developed and well-nourished.  HENT:  Head: Normocephalic.  GI: Soft. Bowel sounds are normal.  Genitourinary:    Vagina normal.     Genitourinary Comments: NEFG; dark brown mucous and blood in the vagina, cervix is closed, long, thick, no CMT, suprapubic or adnexal masses.    Musculoskeletal:     Cervical back: Normal range of motion.  Neurological: She is alert.  Skin: Skin is warm.    MAU Course  Procedures  MDM Slow rise in quant-- 02/23: 106 02/25: 177 02/25: 264 -TVUS repeated as patient complaint of new bleeding and abdominal pain could be indicative of bleeding ectopic into peritoneum.  -Reviewed labs from last visit; patient's Wet prep and GC were negative so cultures not  collected today.  Assessment and Plan   1. Pregnancy of unknown anatomic location    2. Discussed with Dr. Roselie Awkward, who recommends another bHCG and close follow-up. Appt [facility] for Monday [facility]. Strict ectopic precautions reviewed; patient verbalized understanding.   Mervyn Skeeters Ayden Apodaca 05/17/2019, 3:39 PM

## 2019-05-17 NOTE — Discharge Instructions (Signed)
Methotrexate Treatment for an Ectopic Pregnancy, Care After This sheet gives you information about how to care for yourself after your procedure. Your health care provider may also give you more specific instructions. If you have problems or questions, contact your health care provider. What can I expect after the procedure? After the procedure, it is common to have:  Abdominal cramping.  Vaginal bleeding.  Fatigue.  Nausea.  Vomiting.  Diarrhea. Blood tests will be taken at timed intervals for several days or weeks to check your pregnancy hormone levels. The blood tests will be done until the pregnancy hormone can no longer be detected in the blood. Follow these instructions at home: Activity  Do not have sex until your health care provider approves.  Limit activities that take a lot of effort as told by your health care provider. Medicines  Take over the counter and prescription medicines only as told by your health care provider.  Do not take aspirin, ibuprofen, naproxen, or any other NSAIDs.  Do not take folic acid, prenatal vitamins, or other vitamins that contain folic acid. General instructions   Do not drink alcohol.  Follow instructions from your health care provider on how and when to report any symptoms that may indicate a ruptured ectopic pregnancy.  Keep all follow-up visits as told by your health care provider. This is important. Contact a health care provider if:  You have persistent nausea and vomiting.  You have persistent diarrhea.  You are having a reaction to the medicine, such as: ? Tiredness. ? Skin rash. ? Hair loss. Get help right away if:  Your abdominal or pelvic pain gets worse.  You have more vaginal bleeding.  You feel light-headed or you faint.  You have shortness of breath.  Your heart rate increases.  You develop a cough.  You have chills.  You have a fever. Summary  After the procedure, it is common to have symptoms  of abdominal cramping, vaginal bleeding and fatigue. You may also experience other symptoms.  Blood tests will be taken at timed intervals for several days or weeks to check your pregnancy hormone levels. The blood tests will be done until the pregnancy hormone can no longer be detected in the blood.  Limit strenuous activity as told by your health care provider.  Follow instructions from your health care provider on how and when to report any symptoms that may indicate a ruptured ectopic pregnancy. This information is not intended to replace advice given to you by your health care provider. Make sure you discuss any questions you have with your health care provider. Document Revised: 02/16/2017 Document Reviewed: 04/25/2016 Elsevier Patient Education  2020 Elsevier Inc.  

## 2019-05-18 DIAGNOSIS — I10 Essential (primary) hypertension: Secondary | ICD-10-CM

## 2019-05-18 HISTORY — DX: Essential (primary) hypertension: I10

## 2019-05-19 ENCOUNTER — Encounter: Payer: Self-pay | Admitting: *Deleted

## 2019-05-19 ENCOUNTER — Other Ambulatory Visit: Payer: Self-pay

## 2019-05-19 ENCOUNTER — Telehealth: Payer: Self-pay | Admitting: Obstetrics & Gynecology

## 2019-05-19 ENCOUNTER — Telehealth: Payer: Self-pay | Admitting: *Deleted

## 2019-05-19 ENCOUNTER — Ambulatory Visit (INDEPENDENT_AMBULATORY_CARE_PROVIDER_SITE_OTHER): Payer: BC Managed Care – PPO | Admitting: *Deleted

## 2019-05-19 VITALS — BP 129/86 | HR 93 | Temp 98.3°F | Wt 208.0 lb

## 2019-05-19 DIAGNOSIS — O3680X0 Pregnancy with inconclusive fetal viability, not applicable or unspecified: Secondary | ICD-10-CM

## 2019-05-19 LAB — BETA HCG QUANT (REF LAB): hCG Quant: 234 m[IU]/mL

## 2019-05-19 NOTE — Progress Notes (Signed)
Pt here for repeat BHCG. No pain. Continues to have brown spotting. Discussed with Kerry Hough PA. Will do stat BHCG and call pt this afternoon with results. Ectopic precautions reiterated and pt voices understanding and agrees with POC

## 2019-05-19 NOTE — Telephone Encounter (Signed)
PT called with BHCG results today of 234. Since BHCG has dropped will  repeat BHCG at [facility] office in 2 days. Reviewed ectopic precautions and to go to MAU if needed. Voices understanding and agrees with POC.

## 2019-05-19 NOTE — Telephone Encounter (Signed)
Called the patient to inform of the upcoming visit. Left a voicemail with the appointment information and also advised the patient if the timeframe is not convenient please call our office to reschedule.

## 2019-05-19 NOTE — Progress Notes (Signed)
Discussed patient with Dr. Roselie Awkward given irregular rise in quant hCG patterns and multiple values. Since patient is without pain and only very light spotting noted. Suggestion is to repeat hCG in 48 hours and see if it continues to drop or plateau before considering any treatment options, if needed. Patient given ectopic precautions as noted below.   Patient seen and assessed by nursing staff during this encounter. I have reviewed the chart and agree with the documentation and plan.  Kerry Hough, PA-C 05/19/2019 10:29 PM

## 2019-05-20 ENCOUNTER — Other Ambulatory Visit: Payer: Self-pay

## 2019-05-20 DIAGNOSIS — O3680X0 Pregnancy with inconclusive fetal viability, not applicable or unspecified: Secondary | ICD-10-CM

## 2019-05-20 NOTE — Telephone Encounter (Signed)
Opened in error

## 2019-05-21 ENCOUNTER — Other Ambulatory Visit: Payer: BC Managed Care – PPO

## 2019-05-21 ENCOUNTER — Other Ambulatory Visit: Payer: Self-pay

## 2019-05-21 DIAGNOSIS — O3680X0 Pregnancy with inconclusive fetal viability, not applicable or unspecified: Secondary | ICD-10-CM

## 2019-05-21 NOTE — Addendum Note (Signed)
Addended by: Annabell Howells on: 05/21/2019 08:51 AM   Modules accepted: Orders

## 2019-05-22 LAB — BETA HCG QUANT (REF LAB): hCG Quant: 129 m[IU]/mL

## 2019-05-28 ENCOUNTER — Other Ambulatory Visit: Payer: Self-pay

## 2019-05-28 ENCOUNTER — Other Ambulatory Visit: Payer: BC Managed Care – PPO

## 2019-05-28 DIAGNOSIS — O039 Complete or unspecified spontaneous abortion without complication: Secondary | ICD-10-CM

## 2019-05-29 LAB — BETA HCG QUANT (REF LAB): hCG Quant: 5 m[IU]/mL

## 2019-05-30 ENCOUNTER — Telehealth: Payer: Self-pay | Admitting: Obstetrics and Gynecology

## 2019-05-30 NOTE — Telephone Encounter (Addendum)
Spoke to patient in regards to her FMLA/Disability papers that she had dropped off at the office. There was no clear indication why she needed FMLA and patient stated she was trying to have off from 3/1- 3/8 due to heavy bleeding and cramping. Spoke with Ilda Basset about this matter and he denied the request because there was no medical indication. Patient was informed of this and verbalized understanding.

## 2019-06-05 ENCOUNTER — Telehealth: Payer: Self-pay | Admitting: Family Medicine

## 2019-06-05 NOTE — Telephone Encounter (Signed)
Patient called in stating the she wants to speak to someone about her FMLA that was previously declined. Patient stated that she now needs to have FMLA for the loss of her baby. Spoke to Dr. Kennon Rounds about this matter and she approved for the patient to be out of work from 3/1-3/8. Patient was informed of this matter and to have her HR department to re-fax over her paperwork so that I could complete this for her. Patient verbalized understanding and stated that she would have the paperwork faxed over to the attention of Tiana. No further questions.

## 2019-06-30 ENCOUNTER — Ambulatory Visit: Payer: BC Managed Care – PPO | Admitting: Student

## 2019-07-02 DIAGNOSIS — Z029 Encounter for administrative examinations, unspecified: Secondary | ICD-10-CM

## 2019-07-07 ENCOUNTER — Ambulatory Visit: Payer: BC Managed Care – PPO | Admitting: Nurse Practitioner

## 2019-08-25 ENCOUNTER — Inpatient Hospital Stay (HOSPITAL_COMMUNITY): Payer: BC Managed Care – PPO

## 2019-08-25 ENCOUNTER — Encounter (HOSPITAL_COMMUNITY): Payer: Self-pay | Admitting: Obstetrics and Gynecology

## 2019-08-25 ENCOUNTER — Inpatient Hospital Stay (HOSPITAL_COMMUNITY)
Admission: AD | Admit: 2019-08-25 | Discharge: 2019-08-25 | Disposition: A | Discharge status: Home / Self Care | Payer: BC Managed Care – PPO | Attending: Obstetrics and Gynecology | Admitting: Obstetrics and Gynecology

## 2019-08-25 ENCOUNTER — Other Ambulatory Visit: Payer: Self-pay

## 2019-08-25 DIAGNOSIS — O99331 Smoking (tobacco) complicating pregnancy, first trimester: Secondary | ICD-10-CM | POA: Diagnosis not present

## 2019-08-25 DIAGNOSIS — O26851 Spotting complicating pregnancy, first trimester: Secondary | ICD-10-CM | POA: Diagnosis not present

## 2019-08-25 DIAGNOSIS — Z801 Family history of malignant neoplasm of trachea, bronchus and lung: Secondary | ICD-10-CM | POA: Insufficient documentation

## 2019-08-25 DIAGNOSIS — Z3A01 Less than 8 weeks gestation of pregnancy: Secondary | ICD-10-CM

## 2019-08-25 DIAGNOSIS — F17210 Nicotine dependence, cigarettes, uncomplicated: Secondary | ICD-10-CM | POA: Diagnosis not present

## 2019-08-25 DIAGNOSIS — R109 Unspecified abdominal pain: Secondary | ICD-10-CM | POA: Diagnosis not present

## 2019-08-25 DIAGNOSIS — Z8249 Family history of ischemic heart disease and other diseases of the circulatory system: Secondary | ICD-10-CM | POA: Diagnosis not present

## 2019-08-25 DIAGNOSIS — Z87440 Personal history of urinary (tract) infections: Secondary | ICD-10-CM | POA: Diagnosis not present

## 2019-08-25 DIAGNOSIS — N939 Abnormal uterine and vaginal bleeding, unspecified: Secondary | ICD-10-CM | POA: Diagnosis present

## 2019-08-25 DIAGNOSIS — O26891 Other specified pregnancy related conditions, first trimester: Secondary | ICD-10-CM | POA: Diagnosis not present

## 2019-08-25 LAB — CBC WITH DIFFERENTIAL/PLATELET
Abs Immature Granulocytes: 0.03 10*3/uL (ref 0.00–0.07)
Basophils Absolute: 0.1 10*3/uL (ref 0.0–0.1)
Basophils Relative: 1 %
Eosinophils Absolute: 0.2 10*3/uL (ref 0.0–0.5)
Eosinophils Relative: 3 %
HCT: 38.5 % (ref 36.0–46.0)
Hemoglobin: 12.8 g/dL (ref 12.0–15.0)
Immature Granulocytes: 0 %
Lymphocytes Relative: 29 %
Lymphs Abs: 2.8 10*3/uL (ref 0.7–4.0)
MCH: 29.4 pg (ref 26.0–34.0)
MCHC: 33.2 g/dL (ref 30.0–36.0)
MCV: 88.5 fL (ref 80.0–100.0)
Monocytes Absolute: 0.8 10*3/uL (ref 0.1–1.0)
Monocytes Relative: 8 %
Neutro Abs: 5.8 10*3/uL (ref 1.7–7.7)
Neutrophils Relative %: 59 %
Platelets: 270 10*3/uL (ref 150–400)
RBC: 4.35 MIL/uL (ref 3.87–5.11)
RDW: 12.3 % (ref 11.5–15.5)
WBC: 9.7 10*3/uL (ref 4.0–10.5)
nRBC: 0 % (ref 0.0–0.2)

## 2019-08-25 LAB — URINALYSIS, ROUTINE W REFLEX MICROSCOPIC
Bacteria, UA: NONE SEEN
Bilirubin Urine: NEGATIVE
Glucose, UA: NEGATIVE mg/dL
Hgb urine dipstick: NEGATIVE
Ketones, ur: NEGATIVE mg/dL
Leukocytes,Ua: NEGATIVE
Nitrite: NEGATIVE
Protein, ur: 30 mg/dL — AB
Specific Gravity, Urine: 1.033 — ABNORMAL HIGH (ref 1.005–1.030)
pH: 5 (ref 5.0–8.0)

## 2019-08-25 LAB — HIV ANTIBODY (ROUTINE TESTING W REFLEX): HIV Screen 4th Generation wRfx: NONREACTIVE

## 2019-08-25 LAB — WET PREP, GENITAL
Clue Cells Wet Prep HPF POC: NONE SEEN
Sperm: NONE SEEN
Trich, Wet Prep: NONE SEEN
Yeast Wet Prep HPF POC: NONE SEEN

## 2019-08-25 LAB — POCT PREGNANCY, URINE: Preg Test, Ur: POSITIVE — AB

## 2019-08-25 LAB — HCG, QUANTITATIVE, PREGNANCY: hCG, Beta Chain, Quant, S: 14656 m[IU]/mL — ABNORMAL HIGH (ref <5)

## 2019-08-25 NOTE — Discharge Instructions (Signed)
Vaginal Bleeding During Pregnancy, First Trimester  A small amount of bleeding (spotting) from the vagina is common during early pregnancy. Sometimes the bleeding is normal and does not cause problems. At other times, though, bleeding may be a sign of something serious. Tell your doctor about any bleeding from your vagina right away. Follow these instructions at home: Activity  Follow your doctor's instructions about how active you can be.  If needed, make plans for someone to help with your normal activities.  Do not have sex or orgasms until your doctor says that this is safe. General instructions  Take over-the-counter and prescription medicines only as told by your doctor.  Watch your condition for any changes.  Write down: ? The number of pads you use each day. ? How often you change pads. ? How soaked (saturated) your pads are.  Do not use tampons.  Do not douche.  If you pass any tissue from your vagina, save it to show to your doctor.  Keep all follow-up visits as told by your doctor. This is important. Contact a doctor if:  You have vaginal bleeding at any time while you are pregnant.  You have cramps.  You have a fever. Get help right away if:  You have very bad cramps in your back or belly (abdomen).  You pass large clots or a lot of tissue from your vagina.  Your bleeding gets worse.  You feel light-headed.  You feel weak.  You pass out (faint).  You have chills.  You are leaking fluid from your vagina.  You have a gush of fluid from your vagina. Summary  Sometimes vaginal bleeding during pregnancy is normal and does not cause problems. At other times, bleeding may be a sign of something serious.  Tell your doctor about any bleeding from your vagina right away.  Follow your doctor's instructions about how active you can be. You may need someone to help you with your normal activities. This information is not intended to replace advice given to  you by your health care provider. Make sure you discuss any questions you have with your health care provider. Document Revised: 06/25/2018 Document Reviewed: 06/07/2016 Elsevier Patient Education  [place] of Pregnancy  The first trimester of pregnancy is from week 1 until the end of week 13 (months 1 through 3). During this time, your baby will begin to develop inside you. At 6-8 weeks, the eyes and face are formed, and the heartbeat can be seen on ultrasound. At the end of 12 weeks, all the baby's organs are formed. Prenatal care is all the medical care you receive before the birth of your baby. Make sure you get good prenatal care and follow all of your doctor's instructions. Follow these instructions at home: Medicines  Take over-the-counter and prescription medicines only as told by your doctor. Some medicines are safe and some medicines are not safe during pregnancy.  Take a prenatal vitamin that contains at least 600 micrograms (mcg) of folic acid.  If you have trouble pooping (constipation), take medicine that will make your stool soft (stool softener) if your doctor approves. Eating and drinking   Eat regular, healthy meals.  Your doctor will tell you the amount of weight gain that is right for you.  Avoid raw meat and uncooked cheese.  If you feel sick to your stomach (nauseous) or throw up (vomit): ? Eat 4 or 5 small meals a day instead of 3 large meals. ? Try  eating a few soda crackers. ? Drink liquids between meals instead of during meals.  To prevent constipation: ? Eat foods that are high in fiber, like fresh fruits and vegetables, whole grains, and beans. ? Drink enough fluids to keep your pee (urine) clear or pale yellow. Activity  Exercise only as told by your doctor. Stop exercising if you have cramps or pain in your lower belly (abdomen) or low back.  Do not exercise if it is too hot, too humid, or if you are in a place of great  height (high altitude).  Try to avoid standing for long periods of time. Move your legs often if you must stand in one place for a long time.  Avoid heavy lifting.  Wear low-heeled shoes. Sit and stand up straight.  You can have sex unless your doctor tells you not to. Relieving pain and discomfort  Wear a good support bra if your breasts are sore.  Take warm water baths (sitz baths) to soothe pain or discomfort caused by hemorrhoids. Use hemorrhoid cream if your doctor says it is okay.  Rest with your legs raised if you have leg cramps or low back pain.  If you have puffy, bulging veins (varicose veins) in your legs: ? Wear support hose or compression stockings as told by your doctor. ? Raise (elevate) your feet for 15 minutes, 3-4 times a day. ? Limit salt in your food. Prenatal care  Schedule your prenatal visits by the twelfth week of pregnancy.  Write down your questions. Take them to your prenatal visits.  Keep all your prenatal visits as told by your doctor. This is important. Safety  Wear your seat belt at all times when driving.  Make a list of emergency phone numbers. The list should include numbers for family, friends, the hospital, and police and fire departments. General instructions  Ask your doctor for a referral to a local prenatal class. Begin classes no later than at the start of month 6 of your pregnancy.  Ask for help if you need counseling or if you need help with nutrition. Your doctor can give you advice or tell you where to go for help.  Do not use hot tubs, steam rooms, or saunas.  Do not douche or use tampons or scented sanitary pads.  Do not cross your legs for long periods of time.  Avoid all herbs and alcohol. Avoid drugs that are not approved by your doctor.  Do not use any tobacco products, including cigarettes, chewing tobacco, and electronic cigarettes. If you need help quitting, ask your doctor. You may get counseling or other support  to help you quit.  Avoid cat litter boxes and soil used by cats. These carry germs that can cause birth defects in the baby and can cause a loss of your baby (miscarriage) or stillbirth.  Visit your dentist. At home, brush your teeth with a soft toothbrush. Be gentle when you floss. Contact a doctor if:  You are dizzy.  You have mild cramps or pressure in your lower belly.  You have a nagging pain in your belly area.  You continue to feel sick to your stomach, you throw up, or you have watery poop (diarrhea).  You have a bad smelling fluid coming from your vagina.  You have pain when you pee (urinate).  You have increased puffiness (swelling) in your face, hands, legs, or ankles. Get help right away if:  You have a fever.  You are leaking fluid from your  vagina.  You have spotting or bleeding from your vagina.  You have very bad belly cramping or pain.  You gain or lose weight rapidly.  You throw up blood. It may look like coffee grounds.  You are around people who have [place] measles, fifth disease, or chickenpox.  You have a very bad headache.  You have shortness of breath.  You have any kind of trauma, such as from a fall or a car accident. Summary  The first trimester of pregnancy is from week 1 until the end of week 13 (months 1 through 3).  To take care of yourself and your unborn baby, you will need to eat healthy meals, take medicines only if your doctor tells you to do so, and do activities that are safe for you and your baby.  Keep all follow-up visits as told by your doctor. This is important as your doctor will have to ensure that your baby is healthy and growing well. This information is not intended to replace advice given to you by your health care provider. Make sure you discuss any questions you have with your health care provider. Document Revised: 06/27/2018 Document Reviewed: 03/14/2016 Elsevier Patient Education  2020 [employer].

## 2019-08-25 NOTE — MAU Provider Note (Signed)
History     CSN: [id number]  Arrival date and time: 08/25/19 1709   First Provider Initiated Contact with Patient 08/25/19 1813      Chief Complaint  Patient presents with  . Vaginal Bleeding   HPI [name] is a 34 y.o. [phone] at [redacted]w[redacted]d who presents to MAU today with complaint of spotting since this morning. The patient states that she noted brown spotting with wiping today. She had intercourse yesterday. She states mild intermittent cramping since she had +HPT but a little worse today. She denies abnormal discharge, UTI symptoms. She has had intermittent nausea without vomiting, diarrhea or fever. She has not taken any pain medication. She rates her pain at 5/10.   OB History    Gravida  4   Para  2   Term  2   Preterm      AB      Living  2     SAB      TAB      Ectopic      Multiple  0   Live Births  2           Past Medical History:  Diagnosis Date  . Hx of chlamydia infection 03/2016  . Hypertension   . Infection    UTI  . Ovarian cyst   . Postpartum hypertension 07/09/2016  . Pregnancy induced hypertension    meds PP for 6-8wks  . Uterine fibroid     Past Surgical History:  Procedure Laterality Date  . NO PAST SURGERIES      Family History  Problem Relation Age of Onset  . Hypertension Mother   . Cancer Father        lung    Social History   Tobacco Use  . Smoking status: Current Every Day Smoker    Packs/day: 0.50    Types: Cigarettes  . Smokeless tobacco: Never Used  Substance Use Topics  . Alcohol use: Not Currently    Comment: none since pregnancy  . Drug use: No    Allergies: No Known Allergies  Medications Prior to Admission  Medication Sig Dispense Refill Last Dose  . acetaminophen (TYLENOL) 325 MG tablet Take 2 tablets (650 mg total) by mouth every 4 (four) hours as needed (for pain scale < 4). (Patient taking differently: Take 650 mg by mouth every 4 (four) hours as needed for mild pain or headache. ) 30  tablet    . metroNIDAZOLE (FLAGYL) 500 MG tablet Take 1 tablet (500 mg total) by mouth 2 (two) times daily. 14 tablet 0   . Prenatal Vit-Fe Fumarate-FA (PRENATAL MULTIVITAMIN) TABS tablet Take 1 tablet by mouth daily at 12 noon. (Patient taking differently: Take 1 tablet by mouth daily. ) 100 tablet 3     Review of Systems  Constitutional: Negative for fever.  Gastrointestinal: Positive for nausea. Negative for abdominal pain, constipation, diarrhea and vomiting.  Genitourinary: Positive for vaginal bleeding and vaginal discharge. Negative for dysuria, frequency and urgency.   Physical Exam   Blood pressure 138/78, pulse 80, temperature 98.9 F (37.2 C), resp. rate 18, height 5\' 5"  (1.651 m), weight 92.5 kg, last menstrual period 07/17/2019, unknown if currently breastfeeding.  Physical Exam  Nursing note and vitals reviewed. Constitutional: She is oriented to person, place, and time. She appears well-developed and well-nourished. No distress.  HENT:  Head: Normocephalic and atraumatic.  Cardiovascular: Normal rate.  Respiratory: Effort normal.  GI: Soft. She exhibits no distension and no mass.  There is no abdominal tenderness. There is no rebound and no guarding.  Genitourinary: Uterus is not enlarged and not tender. Cervix exhibits no motion tenderness, no discharge and no friability. Right adnexum displays no mass and no tenderness. Left adnexum displays no mass and no tenderness.    Vaginal discharge (mucous) and bleeding (brown) present.  There is bleeding (brown) in the vagina.  Neurological: She is alert and oriented to person, place, and time.  Skin: Skin is warm and dry. No erythema.  Psychiatric: She has a normal mood and affect.  Dilation: Closed Effacement (%): Thick Cervical Position: Posterior Exam by:: Kerry Hough, PA-C   Results for orders placed or performed during the hospital encounter of 08/25/19 (from the past 24 hour(s))  Pregnancy, urine POC     Status:  Abnormal   Collection Time: 08/25/19  5:49 PM  Result Value Ref Range   Preg Test, Ur POSITIVE (A) NEGATIVE  Urinalysis, Routine w reflex microscopic     Status: Abnormal   Collection Time: 08/25/19  5:52 PM  Result Value Ref Range   Color, Urine YELLOW YELLOW   APPearance CLEAR CLEAR   Specific Gravity, Urine 1.033 (H) 1.005 - 1.030   pH 5.0 5.0 - 8.0   Glucose, UA NEGATIVE NEGATIVE mg/dL   Hgb urine dipstick NEGATIVE NEGATIVE   Bilirubin Urine NEGATIVE NEGATIVE   Ketones, ur NEGATIVE NEGATIVE mg/dL   Protein, ur 30 (A) NEGATIVE mg/dL   Nitrite NEGATIVE NEGATIVE   Leukocytes,Ua NEGATIVE NEGATIVE   RBC / HPF 0-5 0 - 5 RBC/hpf   WBC, UA 0-5 0 - 5 WBC/hpf   Bacteria, UA NONE SEEN NONE SEEN   Squamous Epithelial / LPF 0-5 0 - 5   Mucus PRESENT   CBC with Differential/Platelet     Status: None   Collection Time: 08/25/19  6:11 PM  Result Value Ref Range   WBC 9.7 4.0 - 10.5 K/uL   RBC 4.35 3.87 - 5.11 MIL/uL   Hemoglobin 12.8 12.0 - 15.0 g/dL   HCT 38.5 36.0 - 46.0 %   MCV 88.5 80.0 - 100.0 fL   MCH 29.4 26.0 - 34.0 pg   MCHC 33.2 30.0 - 36.0 g/dL   RDW 12.3 11.5 - 15.5 %   Platelets 270 150 - 400 K/uL   nRBC 0.0 0.0 - 0.2 %   Neutrophils Relative % 59 %   Neutro Abs 5.8 1.7 - 7.7 K/uL   Lymphocytes Relative 29 %   Lymphs Abs 2.8 0.7 - 4.0 K/uL   Monocytes Relative 8 %   Monocytes Absolute 0.8 0.1 - 1.0 K/uL   Eosinophils Relative 3 %   Eosinophils Absolute 0.2 0.0 - 0.5 K/uL   Basophils Relative 1 %   Basophils Absolute 0.1 0.0 - 0.1 K/uL   Immature Granulocytes 0 %   Abs Immature Granulocytes 0.03 0.00 - 0.07 K/uL  Wet prep, genital     Status: Abnormal   Collection Time: 08/25/19  6:21 PM   Specimen: Cervix  Result Value Ref Range   Yeast Wet Prep HPF POC NONE SEEN NONE SEEN   Trich, Wet Prep NONE SEEN NONE SEEN   Clue Cells Wet Prep HPF POC NONE SEEN NONE SEEN   WBC, Wet Prep HPF POC MANY (A) NONE SEEN   Sperm NONE SEEN    US OB LESS THAN 14 WEEKS WITH OB  TRANSVAGINAL  Result Date: 08/25/2019 CLINICAL DATA:  Vaginal bleeding EXAM: OBSTETRIC <14 WK [place] AND TRANSVAGINAL OB  US TECHNIQUE: Both transabdominal and transvaginal ultrasound examinations were performed for complete evaluation of the gestation as well as the maternal uterus, adnexal regions, and pelvic cul-de-sac. Transvaginal technique was performed to assess early pregnancy. COMPARISON:  None. FINDINGS: Intrauterine gestational sac: Single Yolk sac:  Visualized. Embryo:  Not Visualized. MSD: 11.7 mm   5 w   6 d Subchorionic hemorrhage:  None visualized. Maternal uterus/adnexae: Left ovary measures 2.4 x 3.4 x 2.4 cm and the right ovary measures 4.6 x 2.1 x 1.8 cm. No free fluid. Likely corpus luteum cyst right ovary measuring 1.8 cm. IMPRESSION: 1. Intrauterine gestational sac and yolk sac as above. Fetal pole is not yet visualized, likely due to early estimated gestational age of [redacted] weeks and 6 days. Serial beta HCG measurements and follow-up ultrasound recommended to document live intrauterine pregnancy. Electronically Signed   By: Randa Ngo M.D.   On: 08/25/2019 19:13    MAU Course  Procedures None  MDM +UPT UA, wet prep, GC/chlamydia, CBC, quant hCG, HIV, RPR and [place] today to rule out ectopic pregnancy A+ blood type  Assessment and Plan  A: [facility] at [redacted]w[redacted]d Spotting in pregnancy, first trimester  Abdominal pain in pregnancy, first trimester   P: Discharge home Tylenol PRN for pain  Bleeding/first trimester precautions discussed Patient advised to follow-up with [facility] as scheduled next week or sooner if symptoms worsen Patient may return to MAU as needed or if her condition were to change or worsen  Kerry Hough, PA-C 08/25/2019, 7:23 PM

## 2019-08-25 NOTE — MAU Note (Signed)
Pt had a miscarriage in March. Had positive Pregnancy last week .Pt reports she just started spotting today with some cramping. Pt worried she is having another miscarriage.

## 2019-08-26 LAB — RPR: RPR Ser Ql: NONREACTIVE

## 2019-08-26 LAB — GC/CHLAMYDIA PROBE AMP (~~LOC~~) NOT AT ARMC
Chlamydia: NEGATIVE
Comment: NEGATIVE
Comment: NORMAL
Neisseria Gonorrhea: NEGATIVE

## 2019-09-12 IMAGING — US US MFM OB FOLLOW-UP
1 series · 13 of 28 positions shown · non-contrast
Comparison: none

[Series 1: us mfm ob follow-up · 13 of 48 slices shown]
[im 2/48]
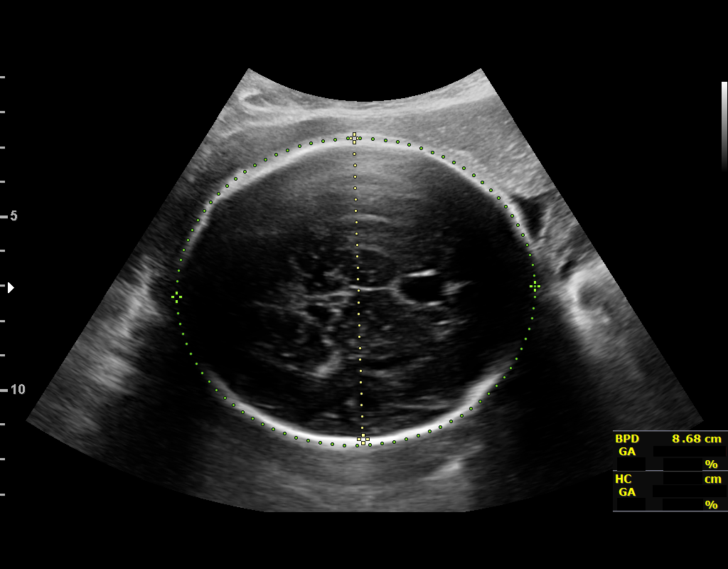
[im 6/48]
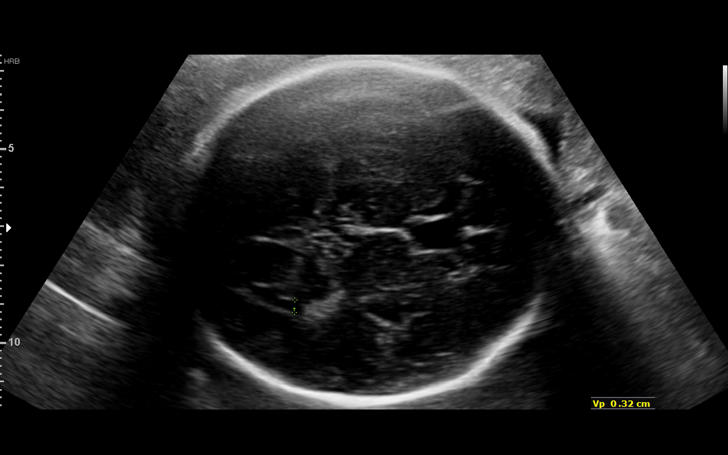
[im 9/48]
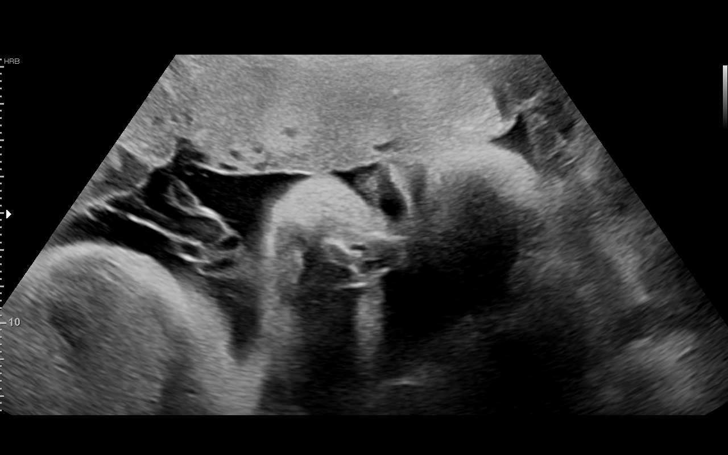
[im 13/48]
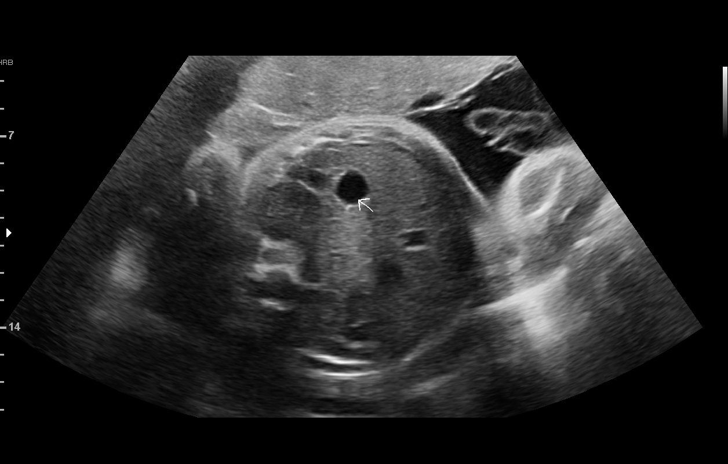
[im 16/48]
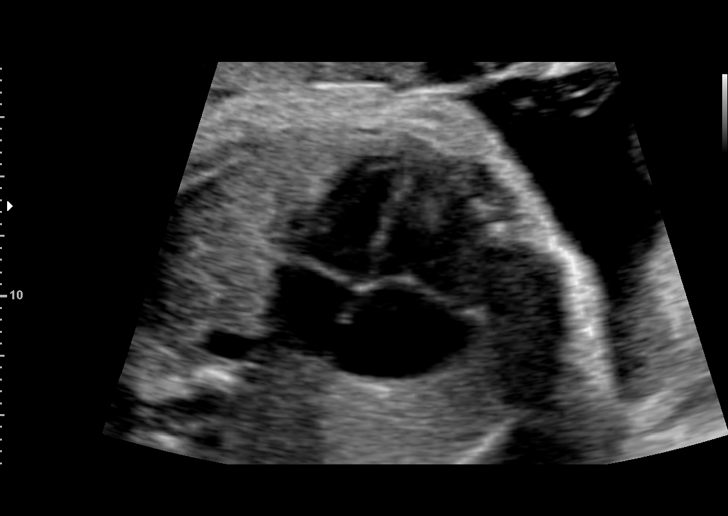
[im 20/48]
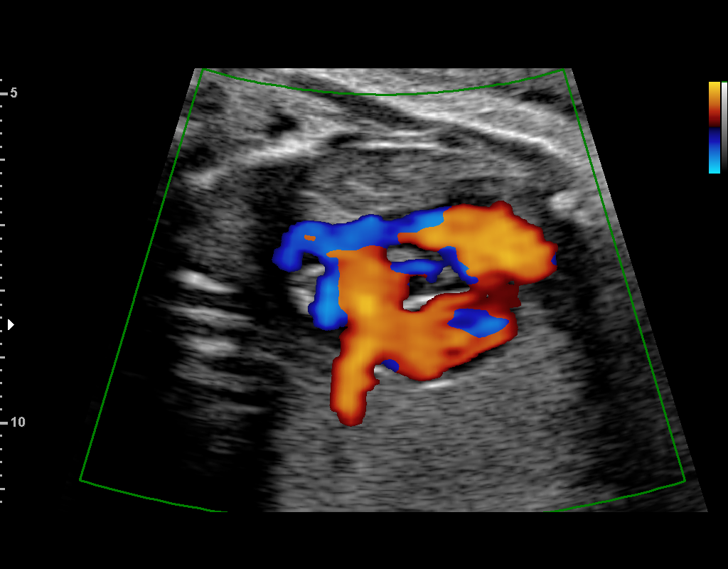
[im 25/48]
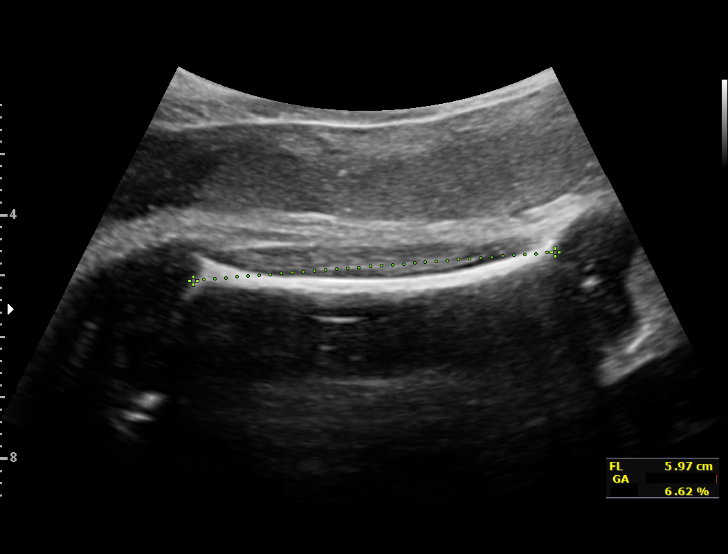
[im 28/48]
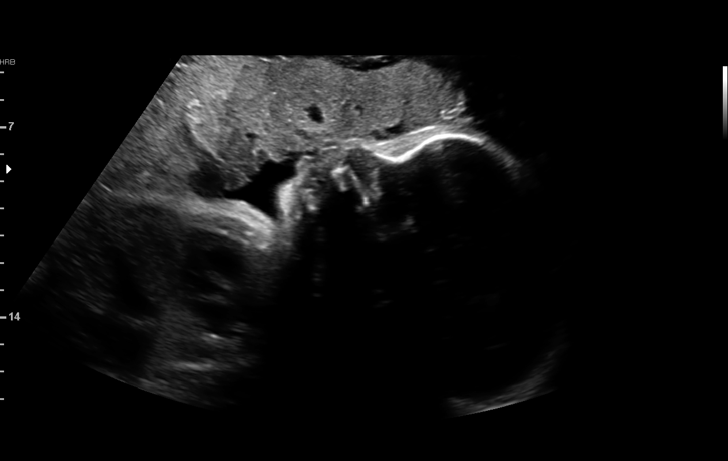
[im 32/48]
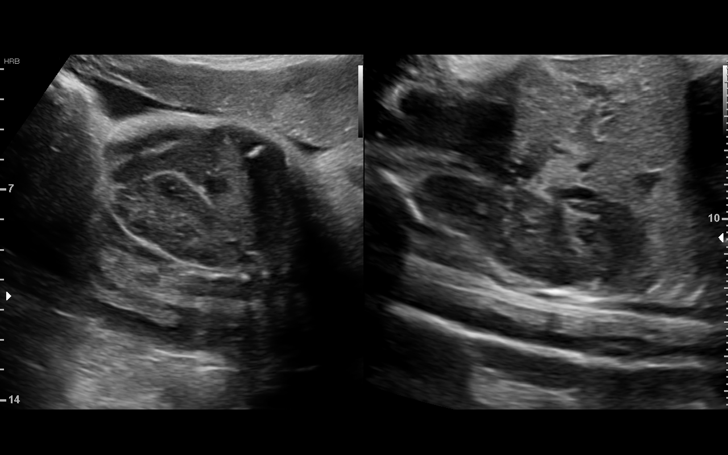
[im 35/48]
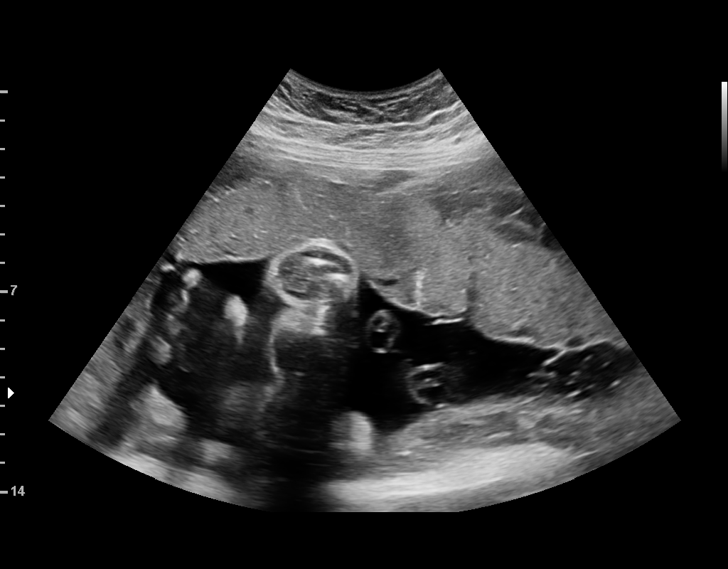
[im 39/48]
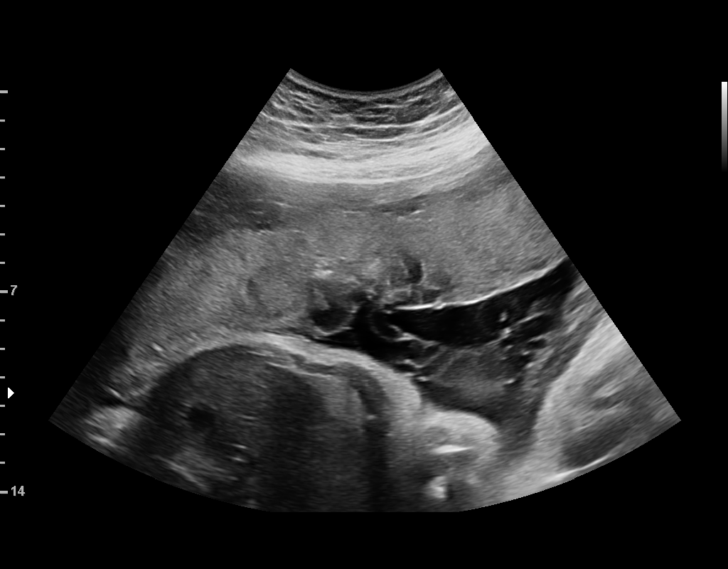
[im 42/48]
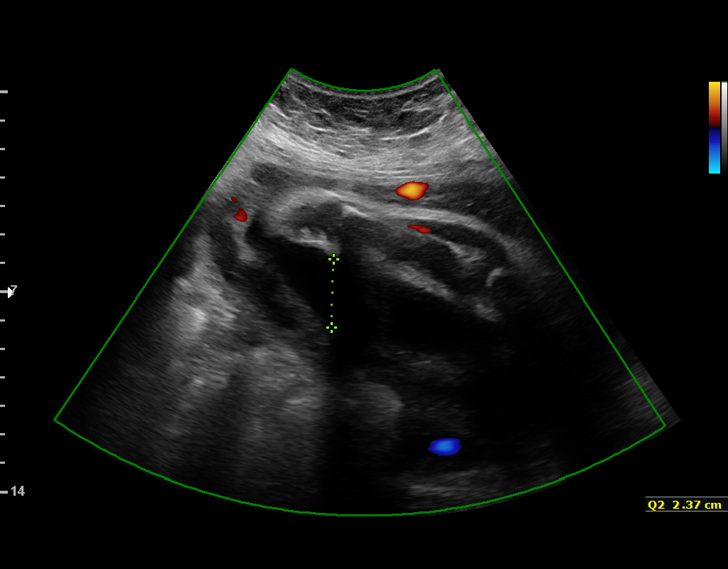
[im 46/48]
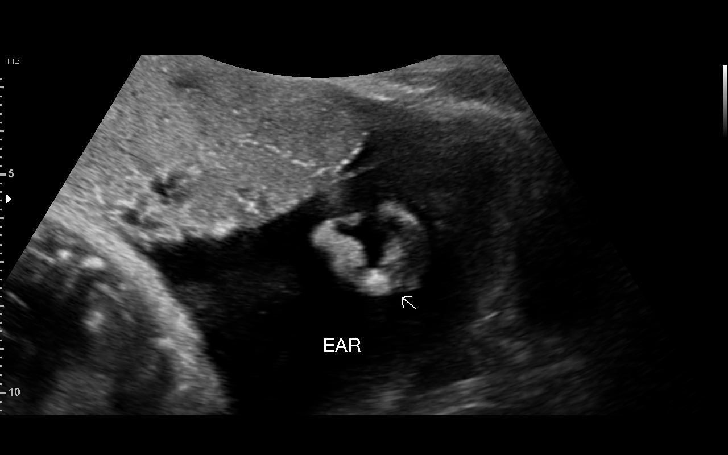

[13 of 28 positions shown; findings below may reference images not displayed]

JANULLAH NP

1  EVIN NAREZ            [id number]      [id number]     [id number]
Indications

32 weeks gestation of pregnancy
Hypertension - Chronic/Pre-existing (on ASA
81mg)
Obesity complicating pregnancy, third
trimester
Short interval between pregancies, 3rd
trimester
OB History

Blood Type:            Height:  5'5"   Weight (lb):  235       BMI:
Gravidity:    2         Term:   1
Living:       1
Fetal Evaluation

Num Of Fetuses:     1
Fetal Heart         142
Rate(bpm):
Cardiac Activity:   Observed
Presentation:       Cephalic
Placenta:           Anterior, above cervical os
P. Cord Insertion:  Previously Visualized

Amniotic Fluid
AFI FV:      Subjectively within normal limits

AFI Sum(cm)     %Tile       Largest Pocket(cm)
15.21           54

RUQ(cm)       RLQ(cm)       LUQ(cm)        LLQ(cm)
5.94
Biometry
BPD:      87.2  mm     G. Age:  35w 1d         96  %    CI:        81.54   %    70 - 86
FL/HC:       19.8  %    19.9 -
HC:      304.8  mm     G. Age:  33w 6d         45  %    HC/AC:       0.96       0.96 -
AC:        318  mm     G. Age:  35w 5d       > 97  %    FL/BPD:      69.4  %    71 - 87
FL:       60.5  mm     G. Age:  31w 3d         11  %    FL/AC:       19.0  %    20 - 24
HUM:      54.5  mm     G. Age:  31w 5d         36  %

Est. FW:    2099   gm     5 lb 5 oz     82  %
Gestational Age

LMP:           32w 5d        Date:  01/31/17                 EDD:   11/07/17
U/S Today:     34w 0d                                        EDD:   10/29/17
Best:          32w 5d     Det. By:  LMP  (01/31/17)          EDD:   11/07/17
Anatomy

Cranium:               Appears normal         Aortic Arch:            Previously seen
Cavum:                 Appears normal         Ductal Arch:            Appears normal
Ventricles:            Appears normal         Diaphragm:              Appears normal
Choroid Plexus:        Previously seen        Stomach:                Appears normal, left
sided
Cerebellum:            Appears normal         Abdomen:                Appears normal
Posterior Fossa:       Appears normal         Abdominal Wall:         Previously seen
Nuchal Fold:           Previously seen        Cord Vessels:           Previously seen
Face:                  Orbits and profile     Kidneys:                Appear normal
previously seen
Lips:                  Previously seen        Bladder:                Appears normal
Thoracic:              Appears normal         Spine:                  Previously seen
Heart:                 Appears normal         Upper Extremities:      Previously seen
(4CH, axis, and situs
RVOT:                  Appears normal         Lower Extremities:      Previously seen
LVOT:                  Appears normal

Other:  Male gender. Heels, 5th digit, and Nasal bone previously visualized.
Cervix Uterus Adnexa

Cervix
Not visualized (advanced GA >67wks)
Impression

Chronic hypertension. Patient reports she had post-partum
hypertension. She takes low-dose aspirin and is not taking
any antihypertensives.
Fetal growth is appropriate for gestational age. Abdominal
circumference measurement is at greater than Amniotic fluid
is normal and good fetal activity is seen. Cephalic
presentation.
We reassured the patient of the findings.
Recommendations

Follow-up fetal growth assessment in 4 weeks.

## 2019-09-23 LAB — OB RESULTS CONSOLE GC/CHLAMYDIA
Chlamydia: NEGATIVE
Gonorrhea: NEGATIVE

## 2019-09-23 LAB — OB RESULTS CONSOLE RUBELLA ANTIBODY, IGM: Rubella: IMMUNE

## 2019-09-23 LAB — OB RESULTS CONSOLE HEPATITIS B SURFACE ANTIGEN: Hepatitis B Surface Ag: NEGATIVE

## 2019-10-10 ENCOUNTER — Inpatient Hospital Stay (HOSPITAL_COMMUNITY)
Admission: AD | Admit: 2019-10-10 | Discharge: 2019-10-10 | Disposition: A | Discharge status: Home / Self Care | Payer: BC Managed Care – PPO | Attending: Obstetrics and Gynecology | Admitting: Obstetrics and Gynecology

## 2019-10-10 ENCOUNTER — Other Ambulatory Visit: Payer: Self-pay

## 2019-10-10 ENCOUNTER — Encounter (HOSPITAL_COMMUNITY): Payer: Self-pay | Admitting: Obstetrics and Gynecology

## 2019-10-10 DIAGNOSIS — F430 Acute stress reaction: Secondary | ICD-10-CM

## 2019-10-10 DIAGNOSIS — Z3A12 12 weeks gestation of pregnancy: Secondary | ICD-10-CM

## 2019-10-10 DIAGNOSIS — F410 Panic disorder [episodic paroxysmal anxiety] without agoraphobia: Secondary | ICD-10-CM

## 2019-10-10 DIAGNOSIS — O99341 Other mental disorders complicating pregnancy, first trimester: Secondary | ICD-10-CM

## 2019-10-10 LAB — URINALYSIS, ROUTINE W REFLEX MICROSCOPIC
Bacteria, UA: NONE SEEN
Bilirubin Urine: NEGATIVE
Glucose, UA: NEGATIVE mg/dL
Ketones, ur: NEGATIVE mg/dL
Leukocytes,Ua: NEGATIVE
Nitrite: NEGATIVE
Protein, ur: NEGATIVE mg/dL
Specific Gravity, Urine: 1.031 — ABNORMAL HIGH (ref 1.005–1.030)
pH: 6 (ref 5.0–8.0)

## 2019-10-10 MED ORDER — HYDROXYZINE HCL 25 MG PO TABS
25.0000 mg | ORAL_TABLET | Freq: Four times a day (QID) | ORAL | 0 refills | Status: DC | PRN
Start: 2019-10-10 — End: 2019-12-02

## 2019-10-10 NOTE — MAU Provider Note (Addendum)
Chief Complaint: Anxiety   First Provider Initiated Contact with Patient 10/10/19 0157        SUBJECTIVE HPI: Anita Potter is a 34 y.o. [id number] at [redacted]w[redacted]d by LMP who presents to maternity admissions reporting having a panic attack at work.  Did not really lose control, but just felt very stressed (would not elaborate) and sad and started crying.  Worried the stress will hurt the baby. She denies vaginal bleeding, vaginal itching/burning, urinary symptoms, h/a, dizziness, n/v, or fever/chills.    Anxiety Presents for initial visit. The problem has been gradually improving. Symptoms include depressed mood and excessive worry. Patient reports no chest pain, confusion, dizziness or panic. Primary symptoms comment: crying. Symptoms occur rarely. The severity of symptoms is moderate. The symptoms are aggravated by family issues and work stress.     RN Note: Pt reports she was at work and thinks she had either a panic attack or an anxiety attack. Pt reports around 2345 she began feeling dizzy and then she felt short of breath, and she began crying . Now she reports she just feels weak. Lower abd cramping all day. Denies bleeding.   Past Medical History:  Diagnosis Date  . Hx of chlamydia infection 03/2016  . Hypertension   . Infection    UTI  . Ovarian cyst   . Postpartum hypertension 07/09/2016  . Pregnancy induced hypertension    meds PP for 6-8wks  . Uterine fibroid    Past Surgical History:  Procedure Laterality Date  . NO PAST SURGERIES     Social History   Socioeconomic History  . Marital status: Married    Spouse name: Not on file  . Number of children: 1  . Years of education: Not on file  . Highest education level: Bachelor's degree (e.g., BA, AB, BS)  Occupational History  . Not on file  Tobacco Use  . Smoking status: Current Every Day Smoker    Packs/day: 0.50    Types: Cigarettes  . Smokeless tobacco: Never Used  Vaping Use  . Vaping Use: Never used   Substance and Sexual Activity  . Alcohol use: Not Currently    Comment: none since pregnancy  . Drug use: No  . Sexual activity: Yes    Birth control/protection: None  Other Topics Concern  . Not on file  Social History Narrative  . Not on file   Social Determinants of Health   Financial Resource Strain:   . Difficulty of Paying Living Expenses:   Food Insecurity:   . Worried About Charity fundraiser in the Last Year:   . Arboriculturist in the Last Year:   Transportation Needs:   . Film/video editor (Medical):   Marland Kitchen Lack of Transportation (Non-Medical):   Physical Activity:   . Days of Exercise per Week:   . Minutes of Exercise per Session:   Stress:   . Feeling of Stress :   Social Connections:   . Frequency of Communication with Friends and Family:   . Frequency of Social Gatherings with Friends and Family:   . Attends Religious Services:   . Active Member of Clubs or Organizations:   . Attends Archivist Meetings:   Marland Kitchen Marital Status:   Intimate Partner Violence:   . Fear of Current or Ex-Partner:   . Emotionally Abused:   Marland Kitchen Physically Abused:   . Sexually Abused:    No current facility-administered medications on file prior to encounter.   Current Outpatient  Medications on File Prior to Encounter  Medication Sig Dispense Refill  . acetaminophen (TYLENOL) 325 MG tablet Take 2 tablets (650 mg total) by mouth every 4 (four) hours as needed (for pain scale < 4). (Patient taking differently: Take 650 mg by mouth every 4 (four) hours as needed for mild pain or headache. ) 30 tablet   . Prenatal Vit-Fe Fumarate-FA (PRENATAL MULTIVITAMIN) TABS tablet Take 1 tablet by mouth daily at 12 noon. (Patient taking differently: Take 1 tablet by mouth daily. ) 100 tablet 3   No Known Allergies  I have reviewed patient's Past Medical Hx, Surgical Hx, Family Hx, Social Hx, medications and allergies.   ROS:  Review of Systems  Cardiovascular: Negative for chest  pain.  Neurological: Negative for dizziness.  Psychiatric/Behavioral: Negative for confusion.   Review of Systems  Other systems negative   Physical Exam  Physical Exam Patient Vitals for the past 24 hrs:  BP Temp Temp src Pulse Resp SpO2 Height Weight  10/10/19 0141 (!) 145/86 98 F (36.7 C) Oral 88 18 98 % 5\' 5"  (1.651 m) (!) 93.9 kg   Constitutional: Well-developed, well-nourished female in no acute distress.  Cardiovascular: normal rate Respiratory: normal effort GI: Abd soft, non-tender. Pos BS x 4 MS: Extremities nontender, no edema, normal ROM Neurologic: Alert and oriented x 4.  GU: Neg CVAT.  PELVIC EXAM: deferred  FHT 150 by doppler  LAB RESULTS      IMAGING Pt informed that the ultrasound is considered a limited OB ultrasound and is not intended to be a complete ultrasound exam.  Patient also informed that the ultrasound is not being completed with the intent of assessing for fetal or placental anomalies or any pelvic abnormalities.  Explained that the purpose of today's ultrasound is to assess for presentation, BPP and amniotic fluid volume.  Patient acknowledges the purpose of the exam and the limitations of the study.    Bedside US done for reassurance.  Patient smiled and laughed at active fetus.  I showed her heartbeat and stressed the baby is resilient and probably not affected by stress/panic/depression.   MAU Management/MDM: Discussed stress management Recommend Vistaril for moments of panic as needed, warned of side effect of sleepiness Number given for mental health referral Pt agrees to call and find counselor  ASSESSMENT Single IUP at [redacted]w[redacted]d Stress reaction Panic attack/crying episode  PLAN Discharge home Info given on  [facility] for referral to counselor Rx Vistaril for anxiety/panic  Pt stable at time of discharge. Encouraged to return here or to other Urgent Care/ED if she develops worsening of symptoms, increase in pain, fever,  or other concerning symptoms.    Hansel Feinstein CNM, MSN Certified Nurse-Midwife 10/10/2019  1:57 AM

## 2019-10-10 NOTE — Discharge Instructions (Signed)
Panic Attack A panic attack is a sudden episode of severe anxiety, fear, or discomfort that causes physical and emotional symptoms. The attack may be in response to something frightening, or it may occur for no known reason. Symptoms of a panic attack can be similar to symptoms of a heart attack or stroke. It is important to see your health care provider when you have a panic attack so that these conditions can be ruled out. A panic attack is a symptom of another condition. Most panic attacks go away with treatment of the underlying problem. If you have panic attacks often, you may have a condition called panic disorder. What are the causes? A panic attack may be caused by:  An extreme, life-threatening situation, such as a war or natural disaster.  An anxiety disorder, such as post-traumatic stress disorder.  Depression.  Certain medical conditions, including heart problems, neurological conditions, and infections.  Certain over-the-counter and prescription medicines.  Illegal drugs that increase heart rate and blood pressure, such as methamphetamine.  Alcohol.  Supplements that increase anxiety.  Panic disorder. What increases the risk? You are more likely to develop this condition if:  You have an anxiety disorder.  You have another mental health condition.  You take certain medicines.  You use alcohol, illegal drugs, or other substances.  You are under extreme stress.  A life event is causing increased feelings of anxiety and depression. What are the signs or symptoms? A panic attack starts suddenly, usually lasts about 20 minutes, and occurs with one or more of the following:  A pounding heart.  A feeling that your heart is beating irregularly or faster than normal (palpitations).  Sweating.  Trembling or shaking.  Shortness of breath or feeling smothered.  Feeling choked.  Chest pain or discomfort.  Nausea or a strange feeling in your  stomach.  Dizziness, feeling lightheaded, or feeling like you might faint.  Chills or hot flashes.  Numbness or tingling in your lips, hands, or feet.  Feeling confused, or feeling that you are not yourself.  Fear of losing control or being emotionally unstable.  Fear of dying. How is this diagnosed? A panic attack is diagnosed with an assessment by your health care provider. During the assessment your health care provider will ask questions about:  Your history of anxiety, depression, and panic attacks.  Your medical history.  Whether you drink alcohol, use illegal drugs, take supplements, or take medicines. Be honest about your substance use. Your health care provider may also:  Order blood tests or other kinds of tests to rule out serious medical conditions.  Refer you to a mental health professional for further evaluation. How is this treated? Treatment depends on the cause of the panic attack:  If the cause is a medical problem, your health care provider will either treat that problem or refer you to a specialist.  If the cause is emotional, you may be given anti-anxiety medicines or referred to a counselor. These medicines may reduce how often attacks happen, reduce how severe the attacks are, and lower anxiety.  If the cause is a medicine, your health care provider may tell you to stop the medicine, change your dose, or take a different medicine.  If the cause is a drug, treatment may involve letting the drug wear off and taking medicine to help the drug leave your body or to counteract its effects. Attacks caused by drug abuse may continue even if you stop using the drug. Follow these instructions   at home:  Take over-the-counter and prescription medicines only as told by your health care provider.  If you feel anxious, limit your caffeine intake.  Take good care of your physical and mental health by: ? Eating a balanced diet that includes plenty of fresh fruits and  vegetables, whole grains, lean meats, and low-fat dairy. ? Getting plenty of rest. Try to get 7-8 hours of uninterrupted sleep each night. ? Exercising regularly. Try to get 30 minutes of physical activity at least 5 days a week. ? Not smoking. Talk to your health care provider if you need help quitting. ? Limiting alcohol intake to no more than 1 drink a day for nonpregnant women and 2 drinks a day for men. One drink equals 12 oz of beer, 5 oz of wine, or 1 oz of hard liquor.  Keep all follow-up visits as told by your health care provider. This is important. Panic attacks may have underlying physical or emotional problems that take time to accurately diagnose. Contact a health care provider if:  Your symptoms do not improve, or they get worse.  You are not able to take your medicine as prescribed because of side effects. Get help right away if:  You have serious thoughts about hurting yourself or others.  You have symptoms of a panic attack. Do not drive yourself to the hospital. Have someone else drive you or call an ambulance. If you ever feel like you may hurt yourself or others, or you have thoughts about taking your own life, get help right away. You can go to your nearest emergency department or call:  Your local emergency services (911 in the [place]  A suicide crisis helpline, such as the [facility] at 617-455-9373. This is open 24 hours a day. Summary  A panic attack is a sign of a serious health or mental health condition. Get help right away. Do not drive yourself to the hospital. Have someone else drive you or call an ambulance.  Always see a health care provider to have the reasons for the panic attack correctly diagnosed.  If your panic attack was caused by a physical problem, follow your health care provider's suggestions for medicine, referral to a specialist, and lifestyle changes.  If your panic attack was caused by an emotional problem,  follow through with counseling from a qualified mental health specialist.  If you feel like you may hurt yourself or others, call 911 and get help right away. This information is not intended to replace advice given to you by your health care provider. Make sure you discuss any questions you have with your health care provider. Document Revised: 02/16/2017 Document Reviewed: 04/14/2016 Elsevier Patient Education  [place], Adult After being diagnosed with an anxiety disorder, you may be relieved to know why you have felt or behaved a certain way. You may also feel overwhelmed about the treatment ahead and what it will mean for your life. With care and support, you can manage this condition and recover from it. How to manage lifestyle changes Managing stress and anxiety  Stress is your body's reaction to life changes and events, both good and bad. Most stress will last just a few hours, but stress can be ongoing and can lead to more than just stress. Although stress can play a major role in anxiety, it is not the same as anxiety. Stress is usually caused by something external, such as a deadline, test, or competition. Stress  normally passes after the triggering event has ended.  Anxiety is caused by something internal, such as imagining a terrible outcome or worrying that something will go wrong that will devastate you. Anxiety often does not go away even after the triggering event is over, and it can become long-term (chronic) worry. It is important to understand the differences between stress and anxiety and to manage your stress effectively so that it does not lead to an anxious response. Talk with your health care provider or a counselor to learn more about reducing anxiety and stress. He or she may suggest tension reduction techniques, such as:  Music therapy. This can include creating or listening to music that you enjoy and that inspires you.  Mindfulness-based  meditation. This involves being aware of your normal breaths while not trying to control your breathing. It can be done while sitting or walking.  Centering prayer. This involves focusing on a word, phrase, or sacred image that means something to you and brings you peace.  Deep breathing. To do this, expand your stomach and inhale slowly through your nose. Hold your breath for 3-5 seconds. Then exhale slowly, letting your stomach muscles relax.  Self-talk. This involves identifying thought patterns that lead to anxiety reactions and changing those patterns.  Muscle relaxation. This involves tensing muscles and then relaxing them. Choose a tension reduction technique that suits your lifestyle and personality. These techniques take time and practice. Set aside 5-15 minutes a day to do them. Therapists can offer counseling and training in these techniques. The training to help with anxiety may be covered by some insurance plans. Other things you can do to manage stress and anxiety include:  Keeping a stress/anxiety diary. This can help you learn what triggers your reaction and then learn ways to manage your response.  Thinking about how you react to certain situations. You may not be able to control everything, but you can control your response.  Making time for activities that help you relax and not feeling guilty about spending your time in this way.  Visual imagery and yoga can help you stay calm and relax.  Medicines Medicines can help ease symptoms. Medicines for anxiety include:  Anti-anxiety drugs.  Antidepressants. Medicines are often used as a primary treatment for anxiety disorder. Medicines will be prescribed by a health care provider. When used together, medicines, psychotherapy, and tension reduction techniques may be the most effective treatment. Relationships Relationships can play a big part in helping you recover. Try to spend more time connecting with trusted friends and  family members. Consider going to couples counseling, taking family education classes, or going to family therapy. Therapy can help you and others better understand your condition. How to recognize changes in your anxiety Everyone responds differently to treatment for anxiety. Recovery from anxiety happens when symptoms decrease and stop interfering with your daily activities at home or work. This may mean that you will start to:  Have better concentration and focus. Worry will interfere less in your daily thinking.  Sleep better.  Be less irritable.  Have more energy.  Have improved memory. It is important to recognize when your condition is getting worse. Contact your health care provider if your symptoms interfere with home or work and you feel like your condition is not improving. Follow these instructions at home: Activity  Exercise. Most adults should do the following: ? Exercise for at least 150 minutes each week. The exercise should increase your heart rate and make you sweat (  moderate-intensity exercise). ? Strengthening exercises at least twice a week.  Get the right amount and quality of sleep. Most adults need 7-9 hours of sleep each night. Lifestyle   Eat a healthy diet that includes plenty of vegetables, fruits, whole grains, low-fat dairy products, and lean protein. Do not eat a lot of foods that are high in solid fats, added sugars, or salt.  Make choices that simplify your life.  Do not use any products that contain nicotine or tobacco, such as cigarettes, e-cigarettes, and chewing tobacco. If you need help quitting, ask your health care provider.  Avoid caffeine, alcohol, and certain over-the-counter cold medicines. These may make you feel worse. Ask your pharmacist which medicines to avoid. General instructions  Take over-the-counter and prescription medicines only as told by your health care provider.  Keep all follow-up visits as told by your health care  provider. This is important. Where to find support You can get help and support from these sources:  Self-help groups.  Online and [employer].  A trusted spiritual leader.  Couples counseling.  Family education classes.  Family therapy. Where to find more information You may find that joining a support group helps you deal with your anxiety. The following sources can help you locate counselors or support groups near you:  [place]: www.mentalhealth[place].net  Anxiety and Depression Association of [place] (ADAA): https://www.clark.net/  National Alliance on Mental Illness (NAMI): www.nami.org Contact a health care provider if you:  Have a hard time staying focused or finishing daily tasks.  Spend many hours a day feeling worried about everyday life.  Become exhausted by worry.  Start to have headaches, feel tense, or have nausea.  Urinate more than normal.  Have diarrhea. Get help right away if you have:  A racing heart and shortness of breath.  Thoughts of hurting yourself or others. If you ever feel like you may hurt yourself or others, or have thoughts about taking your own life, get help right away. You can go to your nearest emergency department or call:  Your local emergency services (911 in the [place]  A suicide crisis helpline, such as the [facility] at 225-375-4854. This is open 24 hours a day. Summary  Taking steps to learn and use tension reduction techniques can help calm you and help prevent triggering an anxiety reaction.  When used together, medicines, psychotherapy, and tension reduction techniques may be the most effective treatment.  Family, friends, and partners can play a big part in helping you recover from an anxiety disorder. This information is not intended to replace advice given to you by your health care provider. Make sure you discuss any questions you have with your health care  provider. Document Revised: 08/06/2018 Document Reviewed: 08/06/2018 Elsevier Patient Education  [place].

## 2019-10-10 NOTE — MAU Note (Signed)
Pt reports she was at work and thinks she had either a panic attack or an anxiety attack. Pt reports around 2345 she began feeling dizzy and then she felt short of breath, and she began crying . Now she reports she just feels weak. Lower abd cramping all day. Denies bleeding.

## 2019-10-17 IMAGING — US US FETAL BPP W/ NON-STRESS
1 series · 13 of 14 positions shown · non-contrast
Comparison: none

[Series 1: us fetal bpp w/nonstress · 14 acquisitions, 13 frames shown]
[im 1/14]
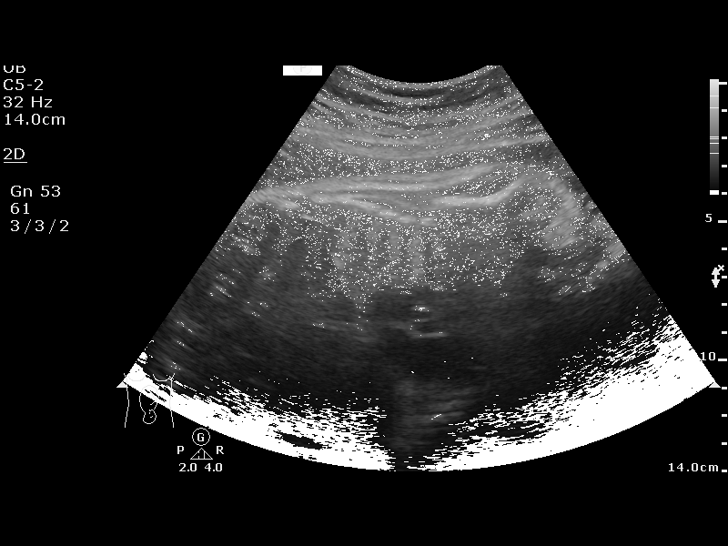
[im 2/14]
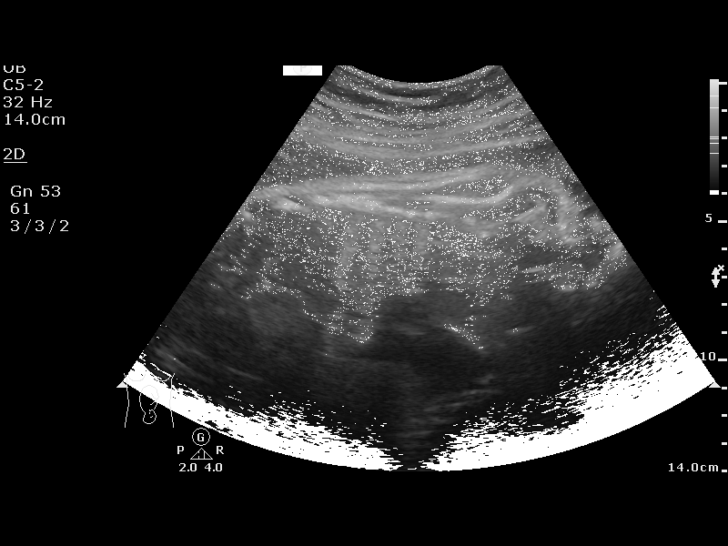
[im 3/14]
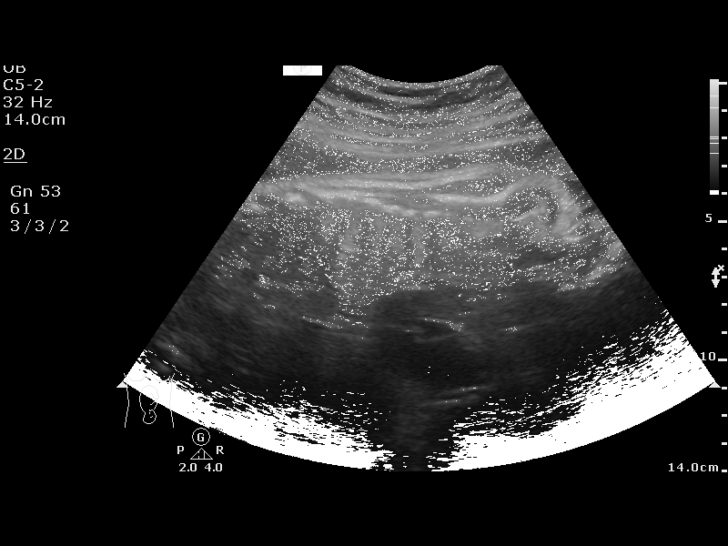
[im 4/14]
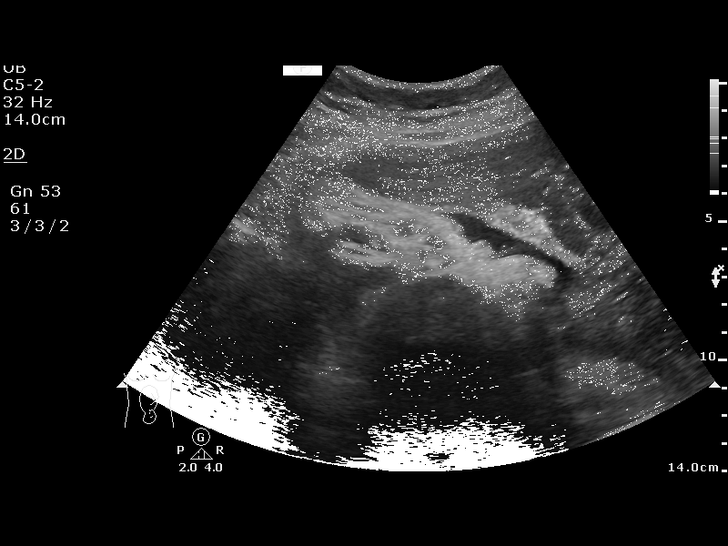
[im 5/14]
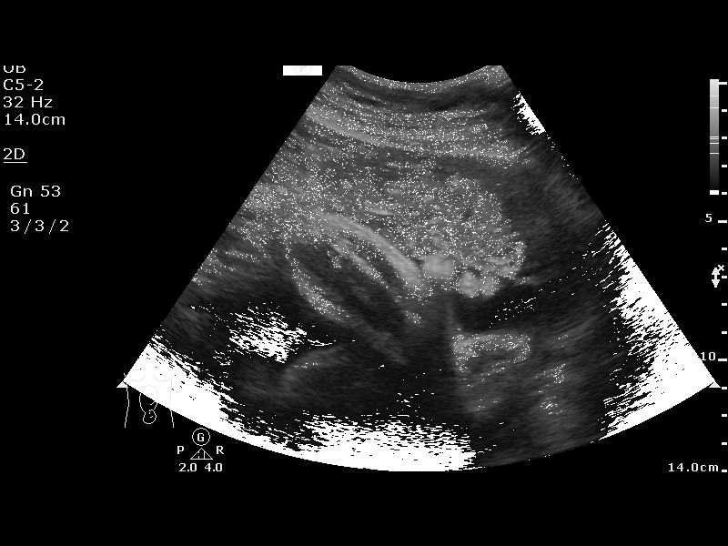
[im 6/14]
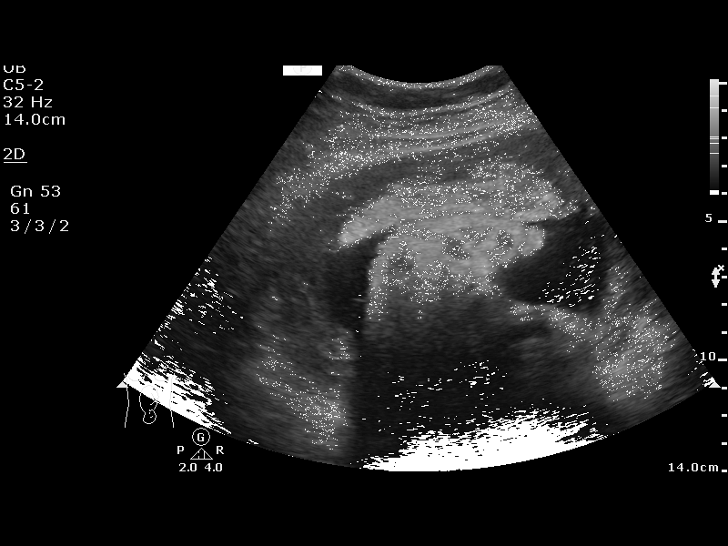
[im 8/14]
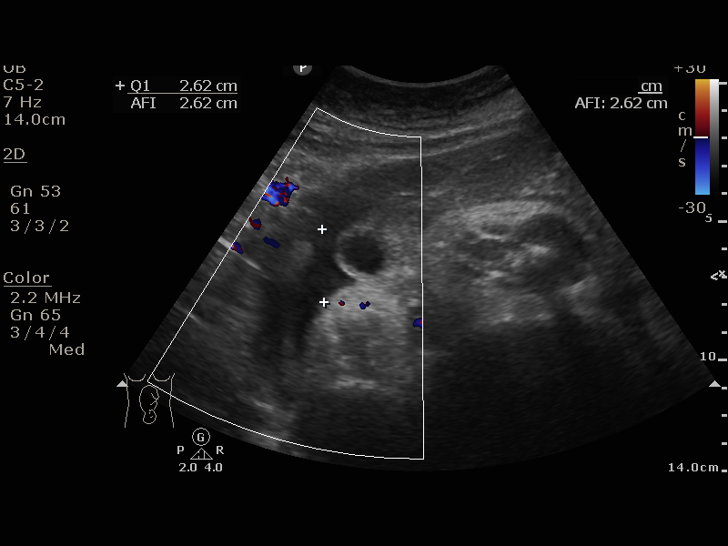
[im 9/14]
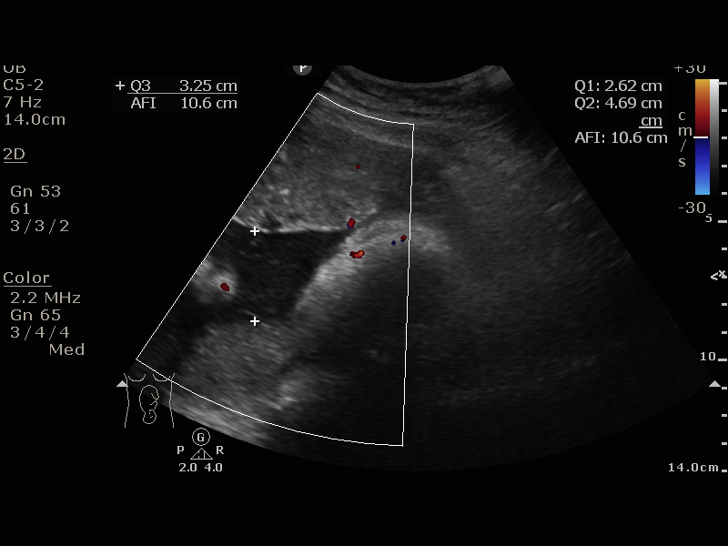
[im 10/14]
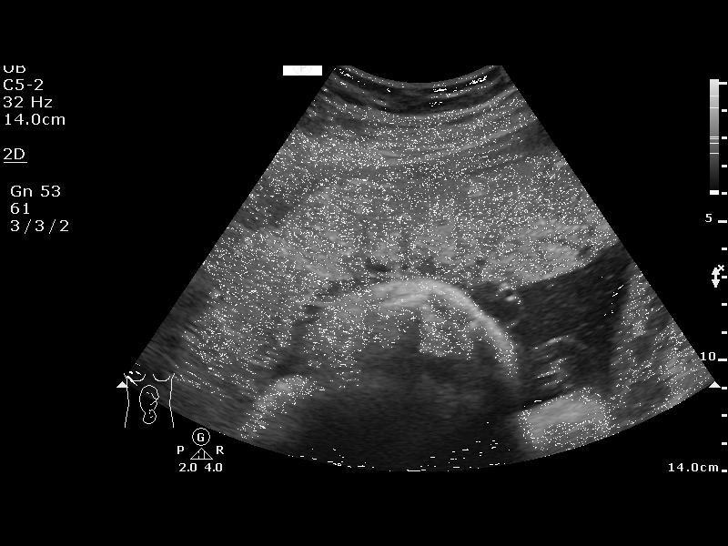
[im 11/14]
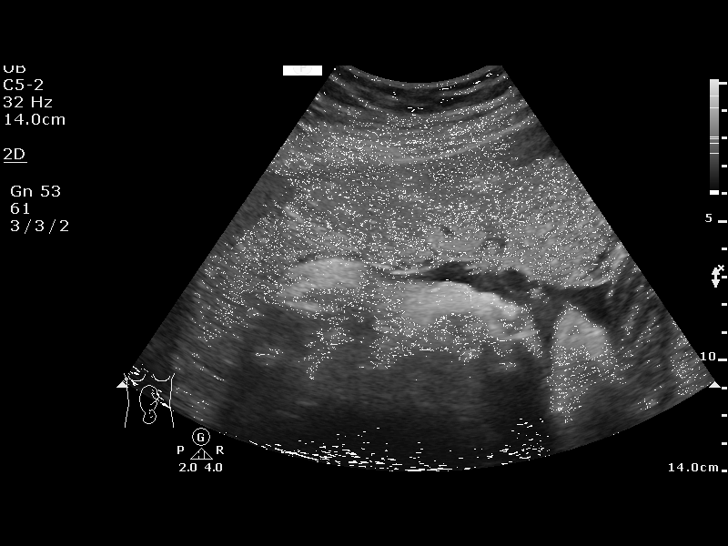
[im 12/14]
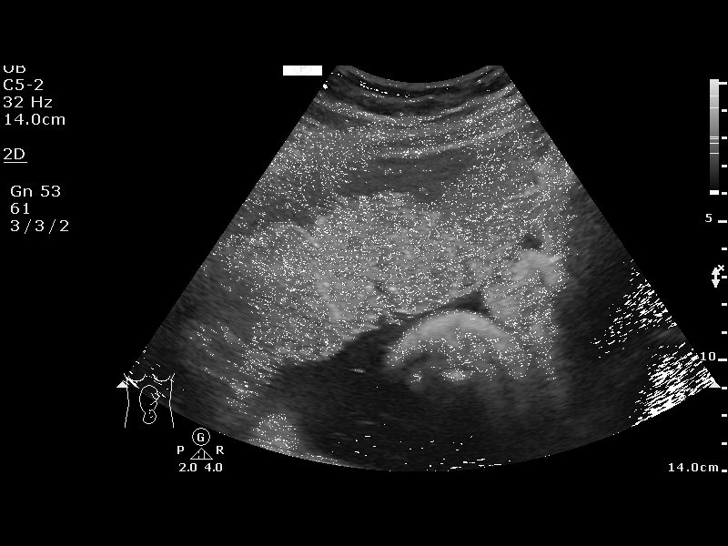
[im 13/14]
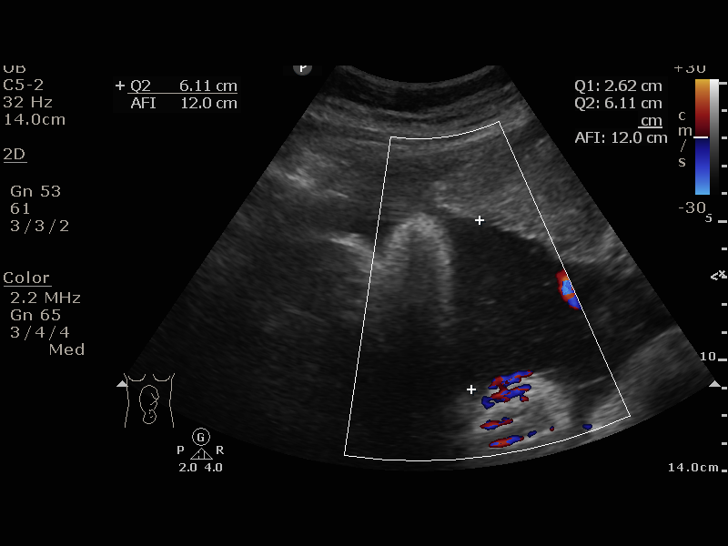
[im 14/14]
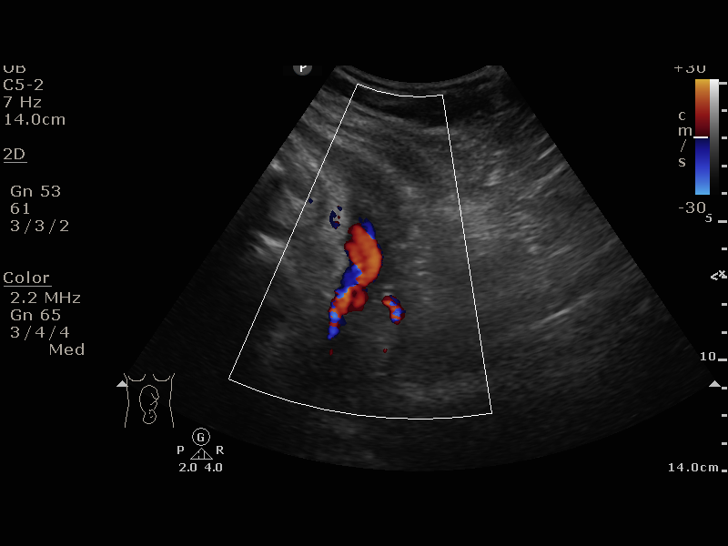

[13 of 14 positions shown; findings below may reference images not displayed]

[facility]
Women[facility]
[REDACTED]

1  US FETAL BPP W/NONSTRESS                    76818.4

1  DON LOLITO ONACRAM              [id number]      [id number]     [id number]
Service(s) Provided

Indications

37 weeks gestation of pregnancy
Unspecified pre-existing hypertension
complicating pregnancy, third trimester
OB History

Blood Type:            Height:  5'5"   Weight (lb):  235       BMI:
Gravidity:    2         Term:   1
Living:       1
Fetal Evaluation

Num Of Fetuses:     1
Preg. Location:     Intrauterine
Cardiac Activity:   Observed
Presentation:       Cephalic

Amniotic Fluid
AFI FV:      Subjectively within normal limits

AFI Sum(cm)     %Tile       Largest Pocket(cm)
11.98           40
RUQ(cm)       RLQ(cm)       LUQ(cm)        LLQ(cm)
2.62          0
Biophysical Evaluation

Amniotic F.V:   Pocket => 2 cm two         F. Tone:        Observed
planes
F. Movement:    Observed                   N.S.T:          Reactive
F. Breathing:   Observed                   Score:          [DATE]
Gestational Age

LMP:           37w 5d        Date:  01/31/17                 EDD:   11/07/17
Best:          37w 5d     Det. By:  LMP  (01/31/17)          EDD:   11/07/17
Impression

BPP [DATE]
Recommendations

Continue antepartum testing

## 2019-12-02 ENCOUNTER — Inpatient Hospital Stay (HOSPITAL_COMMUNITY)
Admission: AD | Admit: 2019-12-02 | Discharge: 2019-12-02 | Disposition: A | Discharge status: Home / Self Care | Payer: BC Managed Care – PPO | Attending: Obstetrics and Gynecology | Admitting: Obstetrics and Gynecology

## 2019-12-02 ENCOUNTER — Other Ambulatory Visit: Payer: Self-pay

## 2019-12-02 ENCOUNTER — Encounter (HOSPITAL_COMMUNITY): Payer: Self-pay | Admitting: Obstetrics and Gynecology

## 2019-12-02 DIAGNOSIS — R109 Unspecified abdominal pain: Secondary | ICD-10-CM | POA: Diagnosis present

## 2019-12-02 DIAGNOSIS — K529 Noninfective gastroenteritis and colitis, unspecified: Secondary | ICD-10-CM | POA: Diagnosis not present

## 2019-12-02 DIAGNOSIS — F17210 Nicotine dependence, cigarettes, uncomplicated: Secondary | ICD-10-CM | POA: Diagnosis not present

## 2019-12-02 DIAGNOSIS — O99332 Smoking (tobacco) complicating pregnancy, second trimester: Secondary | ICD-10-CM | POA: Diagnosis not present

## 2019-12-02 DIAGNOSIS — O10912 Unspecified pre-existing hypertension complicating pregnancy, second trimester: Secondary | ICD-10-CM | POA: Diagnosis not present

## 2019-12-02 DIAGNOSIS — O99612 Diseases of the digestive system complicating pregnancy, second trimester: Secondary | ICD-10-CM | POA: Diagnosis not present

## 2019-12-02 DIAGNOSIS — Z3A19 19 weeks gestation of pregnancy: Secondary | ICD-10-CM | POA: Insufficient documentation

## 2019-12-02 DIAGNOSIS — Z7982 Long term (current) use of aspirin: Secondary | ICD-10-CM | POA: Insufficient documentation

## 2019-12-02 LAB — URINALYSIS, ROUTINE W REFLEX MICROSCOPIC
Bilirubin Urine: NEGATIVE
Glucose, UA: NEGATIVE mg/dL
Ketones, ur: NEGATIVE mg/dL
Leukocytes,Ua: NEGATIVE
Nitrite: NEGATIVE
Protein, ur: NEGATIVE mg/dL
Specific Gravity, Urine: 1.028 (ref 1.005–1.030)
pH: 6 (ref 5.0–8.0)

## 2019-12-02 LAB — URINALYSIS, MICROSCOPIC (REFLEX): Bacteria, UA: NONE SEEN

## 2019-12-02 MED ORDER — DICYCLOMINE HCL 10 MG PO CAPS: 10.0000 mg | ORAL_CAPSULE | Freq: Three times a day (TID) | ORAL | 0 refills | Status: DC | PRN

## 2019-12-02 MED ORDER — DICYCLOMINE HCL 10 MG PO CAPS
20.0000 mg | ORAL_CAPSULE | Freq: Once | ORAL | Status: AC
  Administered 2019-12-02: 20 mg via ORAL
  Filled 2019-12-02: qty 2

## 2019-12-02 MED ORDER — ONDANSETRON 4 MG PO TBDP
4.0000 mg | ORAL_TABLET | Freq: Three times a day (TID) | ORAL | 0 refills | Status: DC | PRN
Start: 2019-12-02 — End: 2020-04-11

## 2019-12-02 NOTE — MAU Note (Signed)
.   Anita Potter is a 34 y.o. at [redacted]w[redacted]d here in MAU reporting: sharp abdominal pain that started yesterday. She also states that she had diarrhea x3 since yesterday morning and is feeling nauseated. No VB or LOF. No abnormal vaginal dc  Pain score: 7 Vitals:   12/02/19 0357  BP: 121/76  Pulse: 81  Resp: 15  Temp: 98.4 F (36.9 C)  SpO2: 100%     FHT:150 Lab orders placed from triage: [facility]

## 2019-12-02 NOTE — MAU Provider Note (Signed)
Chief Complaint: Abdominal Pain   First Provider Initiated Contact with Patient 12/02/19 0426     SUBJECTIVE HPI: Anita Potter is a 34 y.o. [id number] at [redacted]w[redacted]d who presents to [facility] Admissions reporting abdominal cramping & diarrhea. Symptoms started yesterday. Reports mid abdominal pains that are shooting. Pains occur with eating. Has had nausea, but no vomiting. Reports 3 loose stools today. States her son is sick with similar symptoms. Last episode of pain occurred tonight after eating [employer].  Denies fever/chills, dysuria, vaginal bleeding.   Location: abdomen Quality: sharp, shooting Severity: 7/10 on pain scale Duration: 1 day Timing: intermittent Modifying factors: worse with eating Associated signs and symptoms: diarrhea, nausea  Past Medical History:  Diagnosis Date  . Chronic hypertension 05/18/2019  . Hx of chlamydia infection 03/2016  . Infection    UTI  . Postpartum hypertension 07/09/2016  . Pregnancy induced hypertension    meds PP for 6-8wks  . Uterine fibroid    OB History  Gravida Para Term Preterm AB Living  4 2 2   1 2   SAB TAB Ectopic Multiple Live Births  1     0 2    # Outcome Date GA Lbr Len/2nd Weight Sex Delivery Anes PTL Lv  4 Current           3 SAB 05/2019          2 Term 10/31/17 [redacted]w[redacted]d / 00:08 3410 g M Vag-Spont EPI  LIV  1 Term 07/04/16 [redacted]w[redacted]d 07:20 / 00:11 3487 g M Vag-Spont None  LIV   Past Surgical History:  Procedure Laterality Date  . NO PAST SURGERIES     Social History   Socioeconomic History  . Marital status: Married    Spouse name: Not on file  . Number of children: 1  . Years of education: Not on file  . Highest education level: Bachelor's degree (e.g., BA, AB, BS)  Occupational History  . Not on file  Tobacco Use  . Smoking status: Current Every Day Smoker    Packs/day: 0.50    Types: Cigarettes  . Smokeless tobacco: Never Used  Vaping Use  . Vaping Use: Never used  Substance and Sexual Activity  . Alcohol  use: Not Currently    Comment: none since pregnancy  . Drug use: No  . Sexual activity: Yes    Birth control/protection: None  Other Topics Concern  . Not on file  Social History Narrative  . Not on file   Social Determinants of Health   Financial Resource Strain:   . Difficulty of Paying Living Expenses: Not on file  Food Insecurity:   . Worried About Charity fundraiser in the Last Year: Not on file  . Ran Out of Food in the Last Year: Not on file  Transportation Needs:   . Lack of Transportation (Medical): Not on file  . Lack of Transportation (Non-Medical): Not on file  Physical Activity:   . Days of Exercise per Week: Not on file  . Minutes of Exercise per Session: Not on file  Stress:   . Feeling of Stress : Not on file  Social Connections:   . Frequency of Communication with Friends and Family: Not on file  . Frequency of Social Gatherings with Friends and Family: Not on file  . Attends Religious Services: Not on file  . Active Member of Clubs or Organizations: Not on file  . Attends Archivist Meetings: Not on file  . Marital Status: Not on  file  Intimate Partner Violence:   . Fear of Current or Ex-Partner: Not on file  . Emotionally Abused: Not on file  . Physically Abused: Not on file  . Sexually Abused: Not on file   Family History  Problem Relation Age of Onset  . Hypertension Mother   . Cancer Father        lung   No current facility-administered medications on file prior to encounter.   Current Outpatient Medications on File Prior to Encounter  Medication Sig Dispense Refill  . aspirin EC 81 MG tablet Take 81 mg by mouth daily. Swallow whole.    . Prenatal Vit-Fe Fumarate-FA (MULTIVITAMIN-PRENATAL) 27-0.8 MG TABS tablet Take 1 tablet by mouth daily at 12 noon.    Marland Kitchen acetaminophen (TYLENOL) 325 MG tablet Take 2 tablets (650 mg total) by mouth every 4 (four) hours as needed (for pain scale < 4). (Patient taking differently: Take 650 mg by mouth  every 4 (four) hours as needed for mild pain or headache. ) 30 tablet   . hydrOXYzine (ATARAX/VISTARIL) 25 MG tablet Take 1 tablet (25 mg total) by mouth every 6 (six) hours as needed for itching. 30 tablet 0   No Known Allergies  I have reviewed patient's Past Medical Hx, Surgical Hx, Family Hx, Social Hx, medications and allergies.   Review of Systems  Constitutional: Negative.   Gastrointestinal: Positive for abdominal pain, diarrhea and nausea. Negative for blood in stool, constipation and vomiting.  Genitourinary: Negative.     OBJECTIVE Patient Vitals for the past 24 hrs:  BP Temp Temp src Pulse Resp SpO2 Weight  12/02/19 0357 121/76 98.4 F (36.9 C) Oral 81 15 100 % 99 kg   Constitutional: Well-developed, well-nourished female in no acute distress.  Cardiovascular: normal rate & rhythm, no murmur Respiratory: normal rate and effort. Lung sounds clear throughout GI: Abd soft, non-tender, Pos BS x 4. No guarding or rebound tenderness MS: Extremities nontender, no edema, normal ROM Neurologic: Alert and oriented x 4.  GU:    Cervix closed  LAB RESULTS Results for orders placed or performed during the hospital encounter of 12/02/19 (from the past 24 hour(s))  Urinalysis, Routine w reflex microscopic Urine, Clean Catch     Status: Abnormal   Collection Time: 12/02/19  4:31 AM  Result Value Ref Range   Color, Urine YELLOW YELLOW   APPearance HAZY (A) CLEAR   Specific Gravity, Urine 1.028 1.005 - 1.030   pH 6.0 5.0 - 8.0   Glucose, UA NEGATIVE NEGATIVE mg/dL   Hgb urine dipstick SMALL (A) NEGATIVE   Bilirubin Urine NEGATIVE NEGATIVE   Ketones, ur NEGATIVE NEGATIVE mg/dL   Protein, ur NEGATIVE NEGATIVE mg/dL   Nitrite NEGATIVE NEGATIVE   Leukocytes,Ua NEGATIVE NEGATIVE  Urinalysis, Microscopic (reflex)     Status: None   Collection Time: 12/02/19  4:31 AM  Result Value Ref Range   RBC / HPF 0-5 0 - 5 RBC/hpf   WBC, UA 0-5 0 - 5 WBC/hpf   Bacteria, UA NONE SEEN NONE SEEN    Squamous Epithelial / LPF 0-5 0 - 5   Mucus PRESENT    Granular Casts, UA PRESENT     IMAGING No results found.  MAU COURSE Orders Placed This Encounter  Procedures  . Urinalysis, Routine w reflex microscopic Urine, Clean Catch  . Urinalysis, Microscopic (reflex)  . Discharge patient   Meds ordered this encounter  Medications  . dicyclomine (BENTYL) capsule 20 mg  . dicyclomine (BENTYL) 10  MG capsule    Sig: Take 1 capsule (10 mg total) by mouth 3 (three) times daily as needed for up to 3 days for spasms.    Dispense:  9 capsule    Refill:  0    Order Specific Question:   Supervising Provider    Answer:   Aletha Halim [id number]  . ondansetron (ZOFRAN ODT) 4 MG disintegrating tablet    Sig: Take 1 tablet (4 mg total) by mouth every 8 (eight) hours as needed for nausea or vomiting.    Dispense:  15 tablet    Refill:  0    Order Specific Question:   Supervising Provider    Answer:   Aletha Halim [[id number]]    MDM FHT present via doppler  Cervix closed No evidence of UTI  Bentyl given for symptoms. Patient reports improvement in pain down to 2/10.  Suspect viral GI illness, esp since sick child at home with similar symptoms. Will rx zofran & bentyl.   ASSESSMENT 1. Gastroenteritis presumed infectious   2. [redacted] weeks gestation of pregnancy     PLAN Discharge home in stable condition. Rx zofran & bentyl Bland diet Work note given    Follow-up [facility] Follow up.   Contact information: [address]  [address] [place] [address] [phone]              Allergies as of 12/02/2019   No Known Allergies     Medication List    STOP taking these medications   hydrOXYzine 25 MG tablet Commonly known as: ATARAX/VISTARIL     TAKE these medications   acetaminophen 325 MG tablet Commonly known as: Tylenol Take 2 tablets (650 mg total) by mouth every 4 (four) hours as needed (for pain scale < 4). What  changed: reasons to take this   aspirin EC 81 MG tablet Take 81 mg by mouth daily. Swallow whole.   dicyclomine 10 MG capsule Commonly known as: Bentyl Take 1 capsule (10 mg total) by mouth 3 (three) times daily as needed for up to 3 days for spasms.   multivitamin-prenatal 27-0.8 MG Tabs tablet Take 1 tablet by mouth daily at 12 noon.   ondansetron 4 MG disintegrating tablet Commonly known as: Zofran ODT Take 1 tablet (4 mg total) by mouth every 8 (eight) hours as needed for nausea or vomiting.        Jorje Guild, NP 12/02/2019  6:59 AM

## 2019-12-02 NOTE — Discharge Instructions (Signed)
Bland Diet A bland diet consists of foods that are often soft and do not have a lot of fat, fiber, or extra seasonings. Foods without fat, fiber, or seasoning are easier for the body to digest. They are also less likely to irritate your mouth, throat, stomach, and other parts of your digestive system. A bland diet is sometimes called a BRAT diet. What is my plan? Your health care provider or food and nutrition specialist (dietitian) may recommend specific changes to your diet to prevent symptoms or to treat your symptoms. These changes may include:  Eating small meals often.  Cooking food until it is soft enough to chew easily.  Chewing your food well.  Drinking fluids slowly.  Not eating foods that are very spicy, sour, or fatty.  Not eating citrus fruits, such as oranges and grapefruit. What do I need to know about this diet?  Eat a variety of foods from the bland diet food list.  Do not follow a bland diet longer than needed.  Ask your health care provider whether you should take vitamins or supplements. What foods can I eat? Grains  Hot cereals, such as cream of wheat. Rice. Bread, crackers, or tortillas made from refined white flour. Vegetables Canned or cooked vegetables. Mashed or boiled potatoes. Fruits  Bananas. Applesauce. Other types of cooked or canned fruit with the skin and seeds removed, such as canned peaches or pears. Meats and other proteins  Scrambled eggs. Creamy peanut butter or other nut butters. Lean, well-cooked meats, such as chicken or fish. Tofu. Soups or broths. Dairy Low-fat dairy products, such as milk, cottage cheese, or yogurt. Beverages  Water. Herbal tea. Apple juice. Fats and oils Mild salad dressings. Canola or olive oil. Sweets and desserts Pudding. Custard. Fruit gelatin. Ice cream. The items listed above may not be a complete list of recommended foods and beverages. Contact a dietitian for more options. What foods are not  recommended? Grains Whole grain breads and cereals. Vegetables Raw vegetables. Fruits Raw fruits, especially citrus, berries, or dried fruits. Dairy Whole fat dairy foods. Beverages Caffeinated drinks. Alcohol. Seasonings and condiments Strongly flavored seasonings or condiments. Hot sauce. Salsa. Other foods Spicy foods. Fried foods. Sour foods, such as pickled or fermented foods. Foods with high sugar content. Foods high in fiber. The items listed above may not be a complete list of foods and beverages to avoid. Contact a dietitian for more information. Summary  A bland diet consists of foods that are often soft and do not have a lot of fat, fiber, or extra seasonings.  Foods without fat, fiber, or seasoning are easier for the body to digest.  Check with your health care provider to see how long you should follow this diet plan. It is not meant to be followed for long periods. This information is not intended to replace advice given to you by your health care provider. Make sure you discuss any questions you have with your health care provider. Document Revised: 04/04/2017 Document Reviewed: 04/04/2017 Elsevier Patient Education  [place]. Viral Gastroenteritis, Adult  Viral gastroenteritis is also known as the stomach flu. This condition may affect your stomach, small intestine, and large intestine. It can cause sudden watery diarrhea, fever, and vomiting. This condition is caused by many different viruses. These viruses can be passed from person to person very easily (are contagious). Diarrhea and vomiting can make you feel weak and cause you to become dehydrated. You may not be able to keep fluids down.  Dehydration can make you tired and thirsty, cause you to have a dry mouth, and decrease how often you urinate. It is important to replace the fluids that you lose from diarrhea and vomiting. What are the causes? Gastroenteritis is caused by many viruses, including  rotavirus and norovirus. Norovirus is the most common cause in adults. You can get sick after being exposed to the viruses from other people. You can also get sick by:  Eating food, drinking water, or touching a surface contaminated with one of these viruses.  Sharing utensils or other personal items with an infected person. What increases the risk? You are more likely to develop this condition if you:  Have a weak body defense system (immune system).  Live with one or more children who are younger than 86 years old.  Live in a nursing home.  Travel on cruise ships. What are the signs or symptoms? Symptoms of this condition start suddenly 1-3 days after exposure to a virus. Symptoms may last for a few days or for as long as a week. Common symptoms include watery diarrhea and vomiting. Other symptoms include:  Fever.  Headache.  Fatigue.  Pain in the abdomen.  Chills.  Weakness.  Nausea.  Muscle aches.  Loss of appetite. How is this diagnosed? This condition is diagnosed with a medical history and physical exam. You may also have a stool test to check for viruses or other infections. How is this treated? This condition typically goes away on its own. The focus of treatment is to prevent dehydration and restore lost fluids (rehydration). This condition may be treated with:  An oral rehydration solution (ORS) to replace important salts and minerals (electrolytes) in your body. Take this if told by your health care provider. This is a drink that is sold at pharmacies and retail stores.  Medicines to help with your symptoms.  Probiotic supplements to reduce symptoms of diarrhea.  Fluids given through an IV, if dehydration is severe. Older adults and people with other diseases or a weak immune system are at higher risk for dehydration. Follow these instructions at home:  Eating and drinking   Take an ORS as told by your health care provider.  Drink clear fluids in  small amounts as you are able. Clear fluids include: ? Water. ? Ice chips. ? Diluted fruit juice. ? Low-calorie sports drinks.  Drink enough fluid to keep your urine pale yellow.  Eat small amounts of healthy foods every 3-4 hours as you are able. This may include whole grains, fruits, vegetables, lean meats, and yogurt.  Avoid fluids that contain a lot of sugar or caffeine, such as energy drinks, sports drinks, and soda.  Avoid spicy or fatty foods.  Avoid alcohol. General instructions  Wash your hands often, especially after having diarrhea or vomiting. If soap and water are not available, use hand sanitizer.  Make sure that all people in your household wash their hands well and often.  Take over-the-counter and prescription medicines only as told by your health care provider.  Rest at home while you recover.  Watch your condition for any changes.  Take a warm bath to relieve any burning or pain from frequent diarrhea episodes.  Keep all follow-up visits as told by your health care provider. This is important. Contact a health care provider if you:  Cannot keep fluids down.  Have symptoms that get worse.  Have new symptoms.  Feel light-headed or dizzy.  Have muscle cramps. Get help right away  if you:  Have chest pain.  Feel extremely weak or you faint.  See blood in your vomit.  Have vomit that looks like coffee grounds.  Have bloody or black stools or stools that look like tar.  Have a severe headache, a stiff neck, or both.  Have a rash.  Have severe pain, cramping, or bloating in your abdomen.  Have trouble breathing or you are breathing very quickly.  Have a fast heartbeat.  Have skin that feels cold and clammy.  Feel confused.  Have pain when you urinate.  Have signs of dehydration, such as: ? Dark urine, very little urine, or no urine. ? Cracked lips. ? Dry mouth. ? Sunken eyes. ? Sleepiness. ? Weakness. Summary  Viral  gastroenteritis is also known as the stomach flu. It can cause sudden watery diarrhea, fever, and vomiting.  This condition can be passed from person to person very easily (is contagious).  Take an ORS if told by your health care provider. This is a drink that is sold at pharmacies and retail stores.  Wash your hands often, especially after having diarrhea or vomiting. If soap and water are not available, use hand sanitizer. This information is not intended to replace advice given to you by your health care provider. Make sure you discuss any questions you have with your health care provider. Document Revised: 08/23/2018 Document Reviewed: 01/09/2018 Elsevier Patient Education  2020 [employer].

## 2020-02-21 ENCOUNTER — Inpatient Hospital Stay (HOSPITAL_COMMUNITY)
Admission: AD | Admit: 2020-02-21 | Discharge: 2020-02-21 | Disposition: A | Discharge status: Home / Self Care | Payer: BC Managed Care – PPO | Attending: Obstetrics and Gynecology | Admitting: Obstetrics and Gynecology

## 2020-02-21 ENCOUNTER — Other Ambulatory Visit: Payer: Self-pay

## 2020-02-21 ENCOUNTER — Encounter (HOSPITAL_COMMUNITY): Payer: Self-pay | Admitting: Obstetrics and Gynecology

## 2020-02-21 DIAGNOSIS — Z3A31 31 weeks gestation of pregnancy: Secondary | ICD-10-CM | POA: Diagnosis not present

## 2020-02-21 DIAGNOSIS — Z0371 Encounter for suspected problem with amniotic cavity and membrane ruled out: Secondary | ICD-10-CM

## 2020-02-21 DIAGNOSIS — Z8759 Personal history of other complications of pregnancy, childbirth and the puerperium: Secondary | ICD-10-CM | POA: Insufficient documentation

## 2020-02-21 DIAGNOSIS — F17210 Nicotine dependence, cigarettes, uncomplicated: Secondary | ICD-10-CM | POA: Diagnosis not present

## 2020-02-21 DIAGNOSIS — R109 Unspecified abdominal pain: Secondary | ICD-10-CM | POA: Insufficient documentation

## 2020-02-21 DIAGNOSIS — O4703 False labor before 37 completed weeks of gestation, third trimester: Secondary | ICD-10-CM | POA: Diagnosis present

## 2020-02-21 DIAGNOSIS — Z79899 Other long term (current) drug therapy: Secondary | ICD-10-CM | POA: Insufficient documentation

## 2020-02-21 DIAGNOSIS — O26893 Other specified pregnancy related conditions, third trimester: Secondary | ICD-10-CM | POA: Insufficient documentation

## 2020-02-21 DIAGNOSIS — O99333 Smoking (tobacco) complicating pregnancy, third trimester: Secondary | ICD-10-CM | POA: Insufficient documentation

## 2020-02-21 DIAGNOSIS — O26899 Other specified pregnancy related conditions, unspecified trimester: Secondary | ICD-10-CM

## 2020-02-21 LAB — URINALYSIS, ROUTINE W REFLEX MICROSCOPIC
Bilirubin Urine: NEGATIVE
Glucose, UA: NEGATIVE mg/dL
Ketones, ur: 20 mg/dL — AB
Leukocytes,Ua: NEGATIVE
Nitrite: NEGATIVE
Protein, ur: 30 mg/dL — AB
Specific Gravity, Urine: 1.025 (ref 1.005–1.030)
pH: 5 (ref 5.0–8.0)

## 2020-02-21 LAB — WET PREP, GENITAL
Clue Cells Wet Prep HPF POC: NONE SEEN
Sperm: NONE SEEN
Trich, Wet Prep: NONE SEEN
WBC, Wet Prep HPF POC: NONE SEEN
Yeast Wet Prep HPF POC: NONE SEEN

## 2020-02-21 MED ORDER — LACTATED RINGERS IV BOLUS
1000.0000 mL | Freq: Once | INTRAVENOUS | Status: AC
  Administered 2020-02-21: 1000 mL via INTRAVENOUS

## 2020-02-21 NOTE — MAU Note (Signed)
Vertex presentation confirmed via bs [place] by np nugent

## 2020-02-21 NOTE — MAU Note (Signed)
Pt reports to mau with c/o cramping since middle of night.  Pt  Reports losing her mucous plug last night.  Pt states her underwear were "wet" but pants were not.  Pt not reporting any leaking since then.  Reports good fetal movement, denies bleeding.

## 2020-02-21 NOTE — MAU Provider Note (Signed)
History     CSN: [id number]  Arrival date and time: 02/21/20 0900   First Provider Initiated Contact with Patient 02/21/20 [phone]      Chief Complaint  Patient presents with  . Contractions   HPI Anita Potter is a 34 y.o. [id number] at [redacted]w[redacted]d who presents with abdominal pain. She states she was laying down last night and felt like her underwear were damp. She has not had any more leaking of fluid since then. She reports after that, she noticed her contractions were uncomfortable. She states she has regular contractions every day and these are not more frequent, but some were more uncomfortable. She reports one contraction since arrival in MAU. She reports lots of painful fetal movement. Denies any bleeding. She denies any problems in the pregnancy but reports a hx of GHTN with both previous pregnancies.   OB History    Gravida  4   Para  2   Term  2   Preterm      AB  1   Living  2     SAB  1   TAB      Ectopic      Multiple  0   Live Births  2           Past Medical History:  Diagnosis Date  . Chronic hypertension 05/18/2019  . Hx of chlamydia infection 03/2016  . Infection    UTI  . Postpartum hypertension 07/09/2016  . Pregnancy induced hypertension    meds PP for 6-8wks  . Uterine fibroid     Past Surgical History:  Procedure Laterality Date  . NO PAST SURGERIES      Family History  Problem Relation Age of Onset  . Hypertension Mother   . Cancer Father        lung    Social History   Tobacco Use  . Smoking status: Current Every Day Smoker    Packs/day: 0.50    Types: Cigarettes  . Smokeless tobacco: Never Used  Vaping Use  . Vaping Use: Never used  Substance Use Topics  . Alcohol use: Not Currently    Comment: none since pregnancy  . Drug use: No    Allergies: No Known Allergies  Medications Prior to Admission  Medication Sig Dispense Refill Last Dose  . aspirin EC 81 MG tablet Take 81 mg by mouth daily. Swallow whole.    02/20/2020 at Unknown time  . acetaminophen (TYLENOL) 325 MG tablet Take 2 tablets (650 mg total) by mouth every 4 (four) hours as needed (for pain scale < 4). (Patient taking differently: Take 650 mg by mouth every 4 (four) hours as needed for mild pain or headache. ) 30 tablet    . dicyclomine (BENTYL) 10 MG capsule Take 1 capsule (10 mg total) by mouth 3 (three) times daily as needed for up to 3 days for spasms. 9 capsule 0   . ondansetron (ZOFRAN ODT) 4 MG disintegrating tablet Take 1 tablet (4 mg total) by mouth every 8 (eight) hours as needed for nausea or vomiting. 15 tablet 0   . Prenatal Vit-Fe Fumarate-FA (MULTIVITAMIN-PRENATAL) 27-0.8 MG TABS tablet Take 1 tablet by mouth daily at 12 noon.       Review of Systems  Constitutional: Negative.  Negative for fatigue and fever.  HENT: Negative.   Respiratory: Negative.  Negative for shortness of breath.   Cardiovascular: Negative.  Negative for chest pain.  Gastrointestinal: Positive for abdominal pain. Negative for constipation, diarrhea,  nausea and vomiting.  Genitourinary: Positive for vaginal discharge. Negative for dysuria and vaginal bleeding.  Neurological: Negative.  Negative for dizziness and headaches.   Physical Exam   Blood pressure 126/74, pulse 83, temperature 98.3 F (36.8 C), temperature source Oral, resp. rate 16, last menstrual period 07/17/2019, SpO2 100 %, unknown if currently breastfeeding.  Physical Exam Vitals and nursing note reviewed.  Constitutional:      General: She is not in acute distress.    Appearance: She is well-developed.  HENT:     Head: Normocephalic.  Eyes:     Pupils: Pupils are equal, round, and reactive to light.  Cardiovascular:     Rate and Rhythm: Normal rate and regular rhythm.     Heart sounds: Normal heart sounds.  Pulmonary:     Effort: Pulmonary effort is normal. No respiratory distress.     Breath sounds: Normal breath sounds.  Abdominal:     General: Bowel sounds are normal.  There is no distension.     Palpations: Abdomen is soft.     Tenderness: There is no abdominal tenderness.  Genitourinary:    Comments: SSE: No pooling, scant creamy white discharge, cervix visually closed Skin:    General: Skin is warm and dry.  Neurological:     Mental Status: She is alert and oriented to person, place, and time.  Psychiatric:        Behavior: Behavior normal.        Thought Content: Thought content normal.        Judgment: Judgment normal.    Cervix: closed/thick/posterior  Fetal Tracing:  Baseline: 125 Variability: moderate Accels: 15x15 Decels: none  Toco: 1UC   MAU Course  Procedures Results for orders placed or performed during the hospital encounter of 02/21/20 (from the past 24 hour(s))  Wet prep, genital     Status: None   Collection Time: 02/21/20  9:47 AM   Specimen: Cervix  Result Value Ref Range   Yeast Wet Prep HPF POC NONE SEEN NONE SEEN   Trich, Wet Prep NONE SEEN NONE SEEN   Clue Cells Wet Prep HPF POC NONE SEEN NONE SEEN   WBC, Wet Prep HPF POC NONE SEEN NONE SEEN   Sperm NONE SEEN   Urinalysis, Routine w reflex microscopic Urine, Clean Catch     Status: Abnormal   Collection Time: 02/21/20 10:26 AM  Result Value Ref Range   Color, Urine YELLOW YELLOW   APPearance HAZY (A) CLEAR   Specific Gravity, Urine 1.025 1.005 - 1.030   pH 5.0 5.0 - 8.0   Glucose, UA NEGATIVE NEGATIVE mg/dL   Hgb urine dipstick SMALL (A) NEGATIVE   Bilirubin Urine NEGATIVE NEGATIVE   Ketones, ur 20 (A) NEGATIVE mg/dL   Protein, ur 30 (A) NEGATIVE mg/dL   Nitrite NEGATIVE NEGATIVE   Leukocytes,Ua NEGATIVE NEGATIVE   RBC / HPF 0-5 0 - 5 RBC/hpf   WBC, UA 0-5 0 - 5 WBC/hpf   Bacteria, UA RARE (A) NONE SEEN   Squamous Epithelial / LPF 0-5 0 - 5   Mucus PRESENT      MDM Prenatal records from private office reviewed. Labs ordered and reviewed. UA Wet prep and gc/chlamydia LR Bolus  Patient reports complete resolution of cramping and is sleeping in  bed.   Assessment and Plan   1. Abdominal pain affecting pregnancy   2. Encounter for suspected premature rupture of amniotic membranes, with rupture of membranes not found   3. [redacted] weeks gestation  of pregnancy    -Discharge home in stable condition -Preterm albor precautions discussed -Patient advised to follow-up with OB as scheduled on Monday for prenatal care -Patient may return to MAU as needed or if her condition were to change or worsen   [place] 02/21/2020, 9:39 AM

## 2020-02-21 NOTE — Discharge Instructions (Signed)

## 2020-02-23 LAB — GC/CHLAMYDIA PROBE AMP (~~LOC~~) NOT AT ARMC
Chlamydia: NEGATIVE
Comment: NEGATIVE
Comment: NORMAL
Neisseria Gonorrhea: NEGATIVE

## 2020-03-01 ENCOUNTER — Ambulatory Visit: Payer: BC Managed Care – PPO | Attending: Obstetrics and Gynecology | Admitting: Physical Therapy

## 2020-03-01 ENCOUNTER — Encounter: Payer: Self-pay | Admitting: Physical Therapy

## 2020-03-01 ENCOUNTER — Other Ambulatory Visit: Payer: Self-pay

## 2020-03-01 DIAGNOSIS — M62838 Other muscle spasm: Secondary | ICD-10-CM | POA: Insufficient documentation

## 2020-03-01 DIAGNOSIS — M545 Low back pain, unspecified: Secondary | ICD-10-CM | POA: Insufficient documentation

## 2020-03-01 DIAGNOSIS — M6281 Muscle weakness (generalized): Secondary | ICD-10-CM | POA: Diagnosis present

## 2020-03-01 NOTE — Therapy (Addendum)
[facility] [address] [place], [place], [address] Phone: [phone]   Fax:  [phone]  Physical Therapy Evaluation  Patient Details  Name: Anita Potter MRN: [id number] Date of Birth: 1985/04/01 Referring Provider (PT): Bonnee Quin, DO   Encounter Date: 03/01/2020   PT End of Session - 03/01/20 2128    Visit Number 1    Date for PT Re-Evaluation 04/26/20    Authorization Type BCBS    PT Start Time 1147    PT Stop Time 1227    PT Time Calculation (min) 40 min    Activity Tolerance Patient tolerated treatment well    Behavior During Therapy [facility] for tasks assessed/performed           Past Medical History:  Diagnosis Date  . Chronic hypertension 05/18/2019  . Hx of chlamydia infection 03/2016  . Infection    UTI  . Postpartum hypertension 07/09/2016  . Pregnancy induced hypertension    meds PP for 6-8wks  . Uterine fibroid     Past Surgical History:  Procedure Laterality Date  . NO PAST SURGERIES      There were no vitals filed for this visit.    Subjective Assessment - 03/01/20 1154    Subjective I am always in pain and it is much worse than any other pregnancy.  Pt states she works third shift.    Limitations Sitting;Standing;Walking    How long can you stand comfortably? 30-45 min    How long can you walk comfortably? 30-45 min    Patient Stated Goals bring pain down like it has been in previous pregnancies    Currently in Pain? Yes    Pain Score 7    3/10 on a good day   Pain Location Pelvis    Pain Descriptors / Indicators Sharp    Pain Type Acute pain    Pain Radiating Towards into butt down the legs on both sides to mid thigh    Pain Onset More than a month ago    Pain Frequency Constant    Aggravating Factors  getting out of bed; working    Pain Relieving Factors tylenol    Effect of Pain on Daily Activities needs min assist in and out of bed; lifting leg in and out of car,  putting shoe on; standing and walking    Multiple Pain Sites No              [facility] PT Assessment - 03/02/20 0001      Assessment   Medical Diagnosis R10.2 (ICD-10-CM) - Pelvic and perineal pain    Referring Provider (PT) Bonnee Quin, DO    Prior Therapy No      Precautions   Precautions Other (comment)    Precaution Comments 3rd trimester perinatal      Balance Screen   Has the patient fallen in the past 6 months No      [place] residence    Living Arrangements Spouse/significant other;Children   2 and 3 y/o     Prior Function   Level of Independence Independent    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   Vocation Requirements standing and walking      Cognition   Overall Cognitive Status Within Functional Limits for tasks assessed      Observation/Other Assessments   Observations pregnancy posture with weight shift left and slight abomen bulge to the Rt  ROM / Strength   AROM / PROM / Strength Strength;AROM      AROM   Overall AROM Comments lumbar flexion 50%      Strength   Overall Strength Comments MMT hip rotater and flexion and adductoin +pain 4-/5      Ambulation/Gait   Gait Pattern Decreased stride length;Lateral trunk lean to right;Lateral trunk lean to left                      Objective measurements completed on examination: See above findings.               PT Education - 03/01/20 2128    Education Details Access Code: [email or web address]    Person(s) Educated Patient    Methods Explanation;Demonstration;Verbal cues    Comprehension Verbalized understanding;Returned demonstration               PT Long Term Goals - 03/02/20 1124      PT LONG TERM GOAL #1   Title Pt will report pain management improved by 50% for more healthy activity during pregnancy    Time 8    Period Weeks    Status New    Target Date 04/26/20      PT LONG TERM GOAL #2   Title Pt  will be ind with HEP for improved postural strength during pregnancy    Time 8    Period Weeks    Status New    Target Date 04/26/20      PT LONG TERM GOAL #3   Title Pt will have at least 50% less pain with transfers and position changes    Time 8    Period Weeks    Status New    Target Date 04/26/20      PT LONG TERM GOAL #4   Title Pt will be able to tolerate standing for at least an hour at a time for ability to perform her job related duties    Time 8    Period Weeks    Status New    Target Date 04/26/20                  Plan - 03/02/20 1128    Clinical Impression Statement Pt presents to clinic with groin pain that has been severe and unlike her previous 2 full term pregnancies.  Pt has pain mostly in the groin and radiating in the the low back.  Pt has lumbopelvic weakness bilateral hips.  Pt's posture is shifted to the Lt and abdomen appears more bulging to the Rt upon assessment.  Pt has normal pregnancy posture with abnormal tenderness to palpation at SI joints and throughout gluteals.  LE MMT reveals pain and weakness as noted above.  Pt performed trunk flexion with support from UE hands on mat without pain increased.  Pt had relief with reverse clam exercise and activating TrA.  Pt will benefit from skilled PT to address impairments and for pain management in order to improve quality of life and function during pregnancy.    Personal Factors and Comorbidities Comorbidity 2    Comorbidities 2 prior vaginal deliveries; 3rd trimester pregnancy    Examination-Activity Limitations Caring for Others;Lift;Squat;Dressing;Carry;Stand;Transfers    Examination-Participation Restrictions Community Activity;Occupation;Interpersonal Relationship;Laundry;Cleaning    Stability/Clinical Decision Making Evolving/Moderate complexity    Clinical Decision Making Low    Rehab Potential Excellent    PT Frequency 1x / week    PT Duration 8 weeks  PT Treatment/Interventions ADLs/Self  Care Home Management;Biofeedback;Cryotherapy;Moist Heat;Electrical Stimulation;Therapeutic activities;Therapeutic exercise;Balance training;Neuromuscular re-education;Patient/family education;Manual techniques;Passive range of motion;Taping    PT Next Visit Plan ktape TrA; STM RA, gluteals; hip strength reverse clam add band if able; pencil skirt technique; gentle hip add isometric; TrA activation    PT Home Exercise Plan Access Code [email or web address]    Consulted and Agree with Plan of Care Patient           Patient will benefit from skilled therapeutic intervention in order to improve the following deficits and impairments:  Pain,Postural dysfunction,Impaired flexibility,Decreased strength,Increased fascial restricitons,Decreased coordination,Decreased range of motion,Increased muscle spasms,Abnormal gait  Visit Diagnosis: Acute right-sided low back pain, unspecified whether sciatica present  Muscle weakness (generalized)  Other muscle spasm     Problem List Patient Active Problem List   Diagnosis Date Noted  . Chronic hypertension 05/18/2019  . Hypertension in pregnancy, antepartum 10/31/2017  . Obesity (BMI 35.0-39.9 without comorbidity) 04/25/2017  . Vertigo 08/15/2013    Jule Ser, PT 03/02/2020, 11:43 AM  [facility] [address], [place], [place], [address] Phone: [phone]   Fax:  [phone]  Name: Anita Potter MRN: [id number] Date of Birth: [birth year]

## 2020-03-01 NOTE — Patient Instructions (Signed)
Access Code: [email or web address] URL: [email or web address] Date: 03/01/2020 Prepared by: Jari Favre  Exercises Sidelying Reverse Clamshell - 1 x daily - 7 x weekly - 3 sets - 10 reps

## 2020-03-02 NOTE — Addendum Note (Signed)
Addended by: Su Hoff on: 03/02/2020 11:45 AM   Modules accepted: Orders

## 2020-03-11 ENCOUNTER — Telehealth: Payer: Self-pay | Admitting: Physical Therapy

## 2020-03-11 ENCOUNTER — Ambulatory Visit: Payer: BC Managed Care – PPO | Admitting: Physical Therapy

## 2020-03-11 NOTE — Telephone Encounter (Signed)
Patient did not show for appointment.  Patient was called and PT left message to please call us back.  Gustavus Bryant, PT 03/11/20 9:51 AM

## 2020-03-15 DIAGNOSIS — Z20822 Contact with and (suspected) exposure to covid-19: Secondary | ICD-10-CM | POA: Insufficient documentation

## 2020-03-20 NOTE — L&D Delivery Note (Addendum)
Delivery Note Pt labored quickly to complete with uncontrollable urge to push. She pushed ibce at 4:11 PM and delivered a viable female was delivered via Vaginal, Spontaneous (Presentation: ROA     ).  APGAR: 9, 9; weight pending. Anterior and posterior shoulders delivered with next push; body easily followed.  Placenta status: Spontaneous, Intact.  Cord: 3 vessels with the following complications: None. [name]. Cord pH: n/a  Anesthesia: None Episiotomy: None Lacerations: None Suture Repair: n/a Est. Blood Loss (mL): 298  Mom to postpartum.  Baby to Couplet care / Skin to Skin  They desire circumcision for baby.  Anita Potter 04/09/2020, 4:26 PM

## 2020-03-25 ENCOUNTER — Ambulatory Visit: Payer: BC Managed Care – PPO | Attending: Obstetrics and Gynecology | Admitting: Physical Therapy

## 2020-03-25 ENCOUNTER — Other Ambulatory Visit: Payer: Self-pay

## 2020-03-25 ENCOUNTER — Encounter: Payer: Self-pay | Admitting: Physical Therapy

## 2020-03-25 DIAGNOSIS — M545 Low back pain, unspecified: Secondary | ICD-10-CM | POA: Insufficient documentation

## 2020-03-25 DIAGNOSIS — M6281 Muscle weakness (generalized): Secondary | ICD-10-CM | POA: Insufficient documentation

## 2020-03-25 DIAGNOSIS — M62838 Other muscle spasm: Secondary | ICD-10-CM | POA: Diagnosis present

## 2020-03-25 NOTE — Therapy (Addendum)
[facility] [address] [place], [place], [address] Phone: [phone]   Fax:  [phone]  Physical Therapy Treatment  Patient Details  Name: Greyson Riccardi MRN: [id number] Date of Birth: 14-May-1985 Referring Provider (PT): Bonnee Quin, DO   Encounter Date: 03/25/2020   PT End of Session - 03/25/20 1553    Visit Number 2    Date for PT Re-Evaluation 04/26/20    Authorization Type BCBS    PT Start Time 1402    PT Stop Time 1444    PT Time Calculation (min) 42 min    Activity Tolerance Patient tolerated treatment well    Behavior During Therapy [facility] for tasks assessed/performed           Past Medical History:  Diagnosis Date  . Chronic hypertension 05/18/2019  . Hx of chlamydia infection 03/2016  . Infection    UTI  . Postpartum hypertension 07/09/2016  . Pregnancy induced hypertension    meds PP for 6-8wks  . Uterine fibroid     Past Surgical History:  Procedure Laterality Date  . NO PAST SURGERIES      There were no vitals filed for this visit.   Subjective Assessment - 03/25/20 1407    Subjective Pt states " I am maintaining".  No difference noticed, I am working part time but having braxton-hicks and groin pain    Currently in Pain? Yes    Pain Score 7     Pain Location Groin    Pain Orientation Lower    Pain Descriptors / Indicators Constant;Nagging    Pain Radiating Towards same - into buttocks down leg mid thigh    Pain Onset More than a month ago    Pain Frequency Constant    Multiple Pain Sites No                             [facility] Adult PT Treatment/Exercise - 03/25/20 0001      Manual Therapy   Manual Therapy Soft tissue mobilization    Soft tissue mobilization bilateral glutes and lumbar, sacral distraction; fascial release around hip flexors                  PT Education - 03/25/20 1552    Education Details update medbridge    Person(s) Educated  Patient    Methods Explanation;Demonstration;Tactile cues;Verbal cues;Handout    Comprehension Returned demonstration;Verbalized understanding               PT Long Term Goals - 03/02/20 1124      PT LONG TERM GOAL #1   Title Pt will report pain management improved by 50% for more healthy activity during pregnancy    Time 8    Period Weeks    Status New    Target Date 04/26/20      PT LONG TERM GOAL #2   Title Pt will be ind with HEP for improved postural strength during pregnancy    Time 8    Period Weeks    Status New    Target Date 04/26/20      PT LONG TERM GOAL #3   Title Pt will have at least 50% less pain with transfers and position changes    Time 8    Period Weeks    Status New    Target Date 04/26/20      PT LONG TERM GOAL #4   Title  Pt will be able to tolerate standing for at least an hour at a time for ability to perform her job related duties    Time 8    Period Weeks    Status New    Target Date 04/26/20                 Plan - 03/25/20 1631    Clinical Impression Statement Pt did well with lumbar stretches and STM today.  Initial treatment since evaluation so no goals met yet.  Pt was given stretch to do at home to maintain soft tissue length gained during today's session.  Activating the core was painful so will continue to focus on stretches until next visit.    PT Treatment/Interventions ADLs/Self Care Home Management;Biofeedback;Cryotherapy;Moist Heat;Electrical Stimulation;Therapeutic activities;Therapeutic exercise;Balance training;Neuromuscular re-education;Patient/family education;Manual techniques;Passive range of motion;Taping    PT Next Visit Plan try ktape TrA; STM RA, gluteals; hip strength reverse clam add band if able; pencil skirt technique; gentle hip add isometric; TrA activation    PT Home Exercise Plan Access Code [email or web address]    Consulted and Agree with Plan of Care Patient           Patient will benefit from skilled  therapeutic intervention in order to improve the following deficits and impairments:  Pain,Postural dysfunction,Impaired flexibility,Decreased strength,Increased fascial restricitons,Decreased coordination,Decreased range of motion,Increased muscle spasms,Abnormal gait  Visit Diagnosis: Acute right-sided low back pain, unspecified whether sciatica present  Muscle weakness (generalized)  Other muscle spasm     Problem List Patient Active Problem List   Diagnosis Date Noted  . Chronic hypertension 05/18/2019  . Hypertension in pregnancy, antepartum 10/31/2017  . Obesity (BMI 35.0-39.9 without comorbidity) 04/25/2017  . Vertigo 08/15/2013    Jule Ser, PT 03/25/2020, 4:35 PM  [facility] [address], [place], [place], [address] Phone: [phone]   Fax:  [phone]  Name: Lilyahna Sirmon MRN: [id number] Date of Birth: April 29, 1985  PHYSICAL THERAPY DISCHARGE SUMMARY  Visits from Start of Care: 2  Current functional level related to goals / functional outcomes: See above goals   Remaining deficits: See above   Education / Equipment: HEP  Plan: Patient agrees to discharge.  Patient goals were not met. Patient is being discharged due to a change in medical status.  ?????   Pt went into labor   [facility], PT 04/07/20 2:57 PM

## 2020-03-31 LAB — OB RESULTS CONSOLE GBS: GBS: NEGATIVE

## 2020-04-01 ENCOUNTER — Encounter (HOSPITAL_COMMUNITY): Payer: Self-pay | Admitting: Obstetrics and Gynecology

## 2020-04-01 ENCOUNTER — Encounter: Payer: BC Managed Care – PPO | Admitting: Physical Therapy

## 2020-04-01 ENCOUNTER — Inpatient Hospital Stay (HOSPITAL_COMMUNITY)
Admission: AD | Admit: 2020-04-01 | Discharge: 2020-04-01 | Disposition: A | Discharge status: Home / Self Care | Payer: BC Managed Care – PPO | Attending: Obstetrics and Gynecology | Admitting: Obstetrics and Gynecology

## 2020-04-01 DIAGNOSIS — Z3689 Encounter for other specified antenatal screening: Secondary | ICD-10-CM

## 2020-04-01 DIAGNOSIS — Z3A37 37 weeks gestation of pregnancy: Secondary | ICD-10-CM | POA: Diagnosis not present

## 2020-04-01 DIAGNOSIS — O471 False labor at or after 37 completed weeks of gestation: Secondary | ICD-10-CM | POA: Insufficient documentation

## 2020-04-01 NOTE — MAU Provider Note (Signed)
S: [name] is a 35 y.o. [phone] at [redacted]w[redacted]d  who presents to MAU today for labor evaluation.  Nurse reports that after 2 hours, patient without cervical change.  Provider suggest continued evaluation and recheck in one hour.  Per nurse, patient declines and requests discharge.   Cervical exam by RN:  Dilation: 5 Effacement (%): 80 Station: -3 Presentation: Vertex Exam by:: Marcelyn Bruins, RN  Fetal Monitoring: Baseline: 130 Variability: Moderate Accelerations: Present 15x15 Decelerations: None Contractions: x1, UI  MDM Discussed patient with RN. NST reviewed.   A: SIUP at [redacted]w[redacted]d  False labor NST Reactive  P: Okay for discharge. Nurse instructed to give strict return precautions. Patient to follow-up with primary ob as scheduled  Patient may return to MAU as needed or when in labor   Gavin Pound, [place] 04/01/2020 10:12 AM

## 2020-04-01 NOTE — MAU Note (Signed)
Pt reports ctx for the past few days. Got worse last night and this morning. q 5 min. Denies any vag bleeding or leaking a t this time. Good fetal movement felt.

## 2020-04-07 ENCOUNTER — Other Ambulatory Visit: Payer: Self-pay

## 2020-04-07 ENCOUNTER — Encounter (HOSPITAL_COMMUNITY): Payer: Self-pay | Admitting: Obstetrics and Gynecology

## 2020-04-07 ENCOUNTER — Inpatient Hospital Stay (EMERGENCY_DEPARTMENT_HOSPITAL)
Admission: AD | Admit: 2020-04-07 | Discharge: 2020-04-07 | Disposition: A | Discharge status: Home / Self Care | Payer: BC Managed Care – PPO | Source: Home / Self Care | Attending: Obstetrics and Gynecology | Admitting: Obstetrics and Gynecology

## 2020-04-07 DIAGNOSIS — O134 Gestational [pregnancy-induced] hypertension without significant proteinuria, complicating childbirth: Secondary | ICD-10-CM | POA: Diagnosis not present

## 2020-04-07 DIAGNOSIS — F17210 Nicotine dependence, cigarettes, uncomplicated: Secondary | ICD-10-CM | POA: Insufficient documentation

## 2020-04-07 DIAGNOSIS — O10013 Pre-existing essential hypertension complicating pregnancy, third trimester: Secondary | ICD-10-CM | POA: Insufficient documentation

## 2020-04-07 DIAGNOSIS — Z20822 Contact with and (suspected) exposure to covid-19: Secondary | ICD-10-CM | POA: Insufficient documentation

## 2020-04-07 DIAGNOSIS — Z7982 Long term (current) use of aspirin: Secondary | ICD-10-CM | POA: Insufficient documentation

## 2020-04-07 DIAGNOSIS — Z3A37 37 weeks gestation of pregnancy: Secondary | ICD-10-CM | POA: Insufficient documentation

## 2020-04-07 DIAGNOSIS — O10913 Unspecified pre-existing hypertension complicating pregnancy, third trimester: Secondary | ICD-10-CM

## 2020-04-07 DIAGNOSIS — O99333 Smoking (tobacco) complicating pregnancy, third trimester: Secondary | ICD-10-CM | POA: Insufficient documentation

## 2020-04-07 DIAGNOSIS — O10919 Unspecified pre-existing hypertension complicating pregnancy, unspecified trimester: Secondary | ICD-10-CM

## 2020-04-07 LAB — COMPREHENSIVE METABOLIC PANEL
ALT: 10 U/L (ref 0–44)
AST: 17 U/L (ref 15–41)
Albumin: 2.7 g/dL — ABNORMAL LOW (ref 3.5–5.0)
Alkaline Phosphatase: 126 U/L (ref 38–126)
Anion gap: 8 (ref 5–15)
BUN: <5 mg/dL — ABNORMAL LOW (ref 6–20)
CO2: 25 mmol/L (ref 22–32)
Calcium: 8.4 mg/dL — ABNORMAL LOW (ref 8.9–10.3)
Chloride: 106 mmol/L (ref 98–111)
Creatinine, Ser: 0.51 mg/dL (ref 0.44–1.00)
GFR, Estimated: >60 mL/min (ref >60)
Glucose, Bld: 86 mg/dL (ref 70–99)
Potassium: 3.7 mmol/L (ref 3.5–5.1)
Sodium: 139 mmol/L (ref 135–145)
Total Bilirubin: 0.2 mg/dL — ABNORMAL LOW (ref 0.3–1.2)
Total Protein: 6 g/dL — ABNORMAL LOW (ref 6.5–8.1)

## 2020-04-07 LAB — CBC
HCT: 35.3 % — ABNORMAL LOW (ref 36.0–46.0)
Hemoglobin: 11.8 g/dL — ABNORMAL LOW (ref 12.0–15.0)
MCH: 29.6 pg (ref 26.0–34.0)
MCHC: 33.4 g/dL (ref 30.0–36.0)
MCV: 88.5 fL (ref 80.0–100.0)
Platelets: 225 10*3/uL (ref 150–400)
RBC: 3.99 MIL/uL (ref 3.87–5.11)
RDW: 12.4 % (ref 11.5–15.5)
WBC: 10 10*3/uL (ref 4.0–10.5)
nRBC: 0 % (ref 0.0–0.2)

## 2020-04-07 LAB — SARS CORONAVIRUS 2 (TAT 6-24 HRS): SARS Coronavirus 2: NEGATIVE

## 2020-04-07 LAB — PROTEIN / CREATININE RATIO, URINE
Creatinine, Urine: 80.24 mg/dL
Protein Creatinine Ratio: 0.1 mg/mg{Cre} (ref 0.00–0.15)
Total Protein, Urine: 8 mg/dL

## 2020-04-07 MED ORDER — ACETAMINOPHEN 500 MG PO TABS
1000.0000 mg | ORAL_TABLET | Freq: Once | ORAL | Status: AC
  Administered 2020-04-07: 1000 mg via ORAL
  Filled 2020-04-07: qty 2

## 2020-04-07 NOTE — MAU Note (Signed)
Phlebotomy at bedside for lab draw. Will take second blood pressure when draw completed.

## 2020-04-07 NOTE — MAU Note (Signed)
Anita Potter is a 35 y.o. at [redacted]w[redacted]d here in MAU reporting: was sent over from the office due to elevated BP. Currently has headache and has not taken any meds. Denies visual changes.  Onset of complaint: today  Pain score: 4/10  Vitals:   04/07/20 1307  BP: (!) 144/89  Pulse: 95  Resp: 18  Temp: 98.9 F (37.2 C)  SpO2: 98%     FHT: +FM  Lab orders placed from triage: entered by provider

## 2020-04-07 NOTE — MAU Provider Note (Signed)
Chief Complaint:  Hypertension and Headache   Event Date/Time   First Provider Initiated Contact with Patient 04/07/20 1421      HPI: Anita Potter is a 35 y.o. [id number] at [redacted]w[redacted]d who presents to maternity admissions sent from the office for HTN with headache at her visit today. She reports hx of GHTN x 2 previous pregnancies and was on blood pressure medications 6-8 weeks after each pregnancy. She denies any knowledge of HTN outside of pregnancy. Her headache is mild, dull, frontal, and has not required any treatment. She reports "I didn't really notice I had a headache until the Dr asked me in the office today". There are no other symptoms. She is feeling normal fetal movement.  She denies feeling any regular contractions.    HPI  Past Medical History: Past Medical History:  Diagnosis Date   Chronic hypertension 05/18/2019   Hx of chlamydia infection 03/2016   Infection    UTI   Postpartum hypertension 07/09/2016   Pregnancy induced hypertension    meds PP for 6-8wks   Uterine fibroid     Past obstetric history: OB History  Gravida Para Term Preterm AB Living  4 2 2   1 2   SAB IAB Ectopic Multiple Live Births  1     0 2    # Outcome Date GA Lbr Len/2nd Weight Sex Delivery Anes PTL Lv  4 Current           3 SAB 05/2019          2 Term 10/31/17 [redacted]w[redacted]d / 00:08 3410 g M Vag-Spont EPI  LIV  1 Term 07/04/16 [redacted]w[redacted]d 07:20 / 00:11 3487 g M Vag-Spont None  LIV    Past Surgical History: Past Surgical History:  Procedure Laterality Date   NO PAST SURGERIES      Family History: Family History  Problem Relation Age of Onset   Hypertension Mother    Cancer Father        lung    Social History: Social History   Tobacco Use   Smoking status: Current Some Day Smoker    Packs/day: 0.50    Types: Cigarettes   Smokeless tobacco: Never Used  Vaping Use   Vaping Use: Never used  Substance Use Topics   Alcohol use: Not Currently    Comment: none since pregnancy    Drug use: No    Allergies: No Known Allergies  Meds:  Medications Prior to Admission  Medication Sig Dispense Refill Last Dose   aspirin EC 81 MG tablet Take 81 mg by mouth daily. Swallow whole.   04/06/2020 at Unknown time   Prenatal Vit-Fe Fumarate-FA (MULTIVITAMIN-PRENATAL) 27-0.8 MG TABS tablet Take 1 tablet by mouth daily at 12 noon.   04/06/2020 at Unknown time   acetaminophen (TYLENOL) 325 MG tablet Take 2 tablets (650 mg total) by mouth every 4 (four) hours as needed (for pain scale < 4). (Patient taking differently: Take 650 mg by mouth every 4 (four) hours as needed for mild pain or headache. ) 30 tablet     dicyclomine (BENTYL) 10 MG capsule Take 1 capsule (10 mg total) by mouth 3 (three) times daily as needed for up to 3 days for spasms. 9 capsule 0    ondansetron (ZOFRAN ODT) 4 MG disintegrating tablet Take 1 tablet (4 mg total) by mouth every 8 (eight) hours as needed for nausea or vomiting. 15 tablet 0     ROS:  Review of Systems  Constitutional: Negative for  chills, fatigue and fever.  Eyes: Negative for visual disturbance.  Respiratory: Negative for shortness of breath.   Cardiovascular: Negative for chest pain.  Gastrointestinal: Negative for abdominal pain, nausea and vomiting.  Genitourinary: Negative for difficulty urinating, dysuria, flank pain, pelvic pain, vaginal bleeding, vaginal discharge and vaginal pain.  Neurological: Positive for headaches. Negative for dizziness.  Psychiatric/Behavioral: Negative.      I have reviewed patient's Past Medical Hx, Surgical Hx, Family Hx, Social Hx, medications and allergies.   Physical Exam   Patient Vitals for the past 24 hrs:  BP Temp Temp src Pulse Resp SpO2 Height Weight  04/07/20 1531 124/84 -- -- 73 -- -- -- --  04/07/20 1516 127/83 -- -- 73 -- -- -- --  04/07/20 1446 130/83 -- -- 78 -- -- -- --  04/07/20 1431 137/86 -- -- 87 -- -- -- --  04/07/20 1416 136/82 -- -- 86 -- -- -- --  04/07/20 1400 134/90  -- -- 85 -- 99 % -- --  04/07/20 1345 136/86 -- -- 88 -- 100 % -- --  04/07/20 1334 138/84 -- -- 87 -- 99 % -- --  04/07/20 1307 (!) 144/89 98.9 F (37.2 C) Oral 95 18 98 % -- --  04/07/20 1303 -- -- -- -- -- -- 5\' 5"  (1.651 m) 108.1 kg   Constitutional: Well-developed, well-nourished female in no acute distress.  Cardiovascular: normal rate Respiratory: normal effort GI: Abd soft, non-tender, gravid appropriate for gestational age.  MS: Extremities nontender, no edema, normal ROM Neurologic: Alert and oriented x 4.  GU: Neg CVAT.      FHT:  Baseline 135 , moderate variability, accelerations present, no decelerations Contractions: rare, mild to palpation   Labs: Results for orders placed or performed during the hospital encounter of 04/07/20 (from the past 24 hour(s))  Protein / creatinine ratio, urine     Status: None   Collection Time: 04/07/20 12:58 PM  Result Value Ref Range   Creatinine, Urine 80.24 mg/dL   Total Protein, Urine 8 mg/dL   Protein Creatinine Ratio 0.10 0.00 - 0.15 mg/mg[Cre]  CBC     Status: Abnormal   Collection Time: 04/07/20  1:27 PM  Result Value Ref Range   WBC 10.0 4.0 - 10.5 K/uL   RBC 3.99 3.87 - 5.11 MIL/uL   Hemoglobin 11.8 (L) 12.0 - 15.0 g/dL   HCT 35.3 (L) 36.0 - 46.0 %   MCV 88.5 80.0 - 100.0 fL   MCH 29.6 26.0 - 34.0 pg   MCHC 33.4 30.0 - 36.0 g/dL   RDW 12.4 11.5 - 15.5 %   Platelets 225 150 - 400 K/uL   nRBC 0.0 0.0 - 0.2 %  Comprehensive metabolic panel     Status: Abnormal   Collection Time: 04/07/20  1:27 PM  Result Value Ref Range   Sodium 139 135 - 145 mmol/L   Potassium 3.7 3.5 - 5.1 mmol/L   Chloride 106 98 - 111 mmol/L   CO2 25 22 - 32 mmol/L   Glucose, Bld 86 70 - 99 mg/dL   BUN <5 (L) 6 - 20 mg/dL   Creatinine, Ser 0.51 0.44 - 1.00 mg/dL   Calcium 8.4 (L) 8.9 - 10.3 mg/dL   Total Protein 6.0 (L) 6.5 - 8.1 g/dL   Albumin 2.7 (L) 3.5 - 5.0 g/dL   AST 17 15 - 41 U/L   ALT 10 0 - 44 U/L   Alkaline Phosphatase 126 38 -  126 U/L  Total Bilirubin 0.2 (L) 0.3 - 1.2 mg/dL   GFR, Estimated >60 >60 mL/min   Anion gap 8 5 - 15      Imaging:  No results found.  MAU Course/MDM: Orders Placed This Encounter  Procedures   SARS CORONAVIRUS 2 (TAT 6-24 HRS) Nasopharyngeal Nasopharyngeal Swab   CBC   Comprehensive metabolic panel   Protein / creatinine ratio, urine   Airborne and Contact precautions   Discharge patient    Meds ordered this encounter  Medications   acetaminophen (TYLENOL) tablet 1,000 mg     NST reviewed and reactive PEC labs wnl with P/C ratio 0.10 BPs in MAU mostly normotensive with some borderline elevated BPs H/a resolved with Tylenol given in MAU Consult Dr Ilda Basset. Given likely CHTN, and normal labs today and lack of PEC s/sx, pt can keep IOL scheduled in 2 days.  If h/a returns or other s/sx of PEC develop, she should return to MAU. Called Dr Willis Modena to review POC and he agrees that pt is to keep IOL in 2 days and return to MAU as needed PEC precautions reviewed   Assessment: 1. Chronic hypertension affecting pregnancy   2. [redacted] weeks gestation of pregnancy     Plan: Discharge home Labor precautions and fetal kick counts  Follow-up Information    [facility] Follow up.   Specialty: Obstetrics and Gynecology Why: Return when called for your induction on Friday, 04/09/20, or return to MAU as needed for signs of labor or emergencies. Contact information: [address] [id number] [place] [phone]             Allergies as of 04/07/2020   No Known Allergies     Medication List    TAKE these medications   acetaminophen 325 MG tablet Commonly known as: Tylenol Take 2 tablets (650 mg total) by mouth every 4 (four) hours as needed (for pain scale < 4). What changed: reasons to take this   aspirin EC 81 MG tablet Take 81 mg by mouth daily. Swallow whole.   dicyclomine 10 MG capsule Commonly known as:  Bentyl Take 1 capsule (10 mg total) by mouth 3 (three) times daily as needed for up to 3 days for spasms.   multivitamin-prenatal 27-0.8 MG Tabs tablet Take 1 tablet by mouth daily at 12 noon.   ondansetron 4 MG disintegrating tablet Commonly known as: Zofran ODT Take 1 tablet (4 mg total) by mouth every 8 (eight) hours as needed for nausea or vomiting.       Fatima Blank Certified Nurse-Midwife 04/07/2020 4:15 PM

## 2020-04-08 ENCOUNTER — Ambulatory Visit: Payer: BC Managed Care – PPO | Admitting: Physical Therapy

## 2020-04-08 ENCOUNTER — Telehealth (HOSPITAL_COMMUNITY): Payer: Self-pay | Admitting: *Deleted

## 2020-04-08 ENCOUNTER — Inpatient Hospital Stay (HOSPITAL_COMMUNITY): Admit: 2020-04-08 | Payer: BC Managed Care – PPO

## 2020-04-08 NOTE — Telephone Encounter (Signed)
Preadmission screen  

## 2020-04-09 ENCOUNTER — Inpatient Hospital Stay (HOSPITAL_COMMUNITY)
Admission: AD | Admit: 2020-04-09 | Discharge: 2020-04-11 | DRG: 807 | Disposition: A | Discharge status: Home / Self Care | Payer: BC Managed Care – PPO | Attending: Obstetrics and Gynecology | Admitting: Obstetrics and Gynecology

## 2020-04-09 ENCOUNTER — Other Ambulatory Visit: Payer: Self-pay

## 2020-04-09 ENCOUNTER — Inpatient Hospital Stay (HOSPITAL_COMMUNITY): Payer: BC Managed Care – PPO

## 2020-04-09 ENCOUNTER — Encounter (HOSPITAL_COMMUNITY): Payer: Self-pay | Admitting: Obstetrics and Gynecology

## 2020-04-09 DIAGNOSIS — F17210 Nicotine dependence, cigarettes, uncomplicated: Secondary | ICD-10-CM | POA: Diagnosis present

## 2020-04-09 DIAGNOSIS — Z7982 Long term (current) use of aspirin: Secondary | ICD-10-CM

## 2020-04-09 DIAGNOSIS — F419 Anxiety disorder, unspecified: Secondary | ICD-10-CM | POA: Diagnosis present

## 2020-04-09 DIAGNOSIS — O99334 Smoking (tobacco) complicating childbirth: Secondary | ICD-10-CM | POA: Diagnosis present

## 2020-04-09 DIAGNOSIS — Z8616 Personal history of COVID-19: Secondary | ICD-10-CM | POA: Diagnosis not present

## 2020-04-09 DIAGNOSIS — O134 Gestational [pregnancy-induced] hypertension without significant proteinuria, complicating childbirth: Principal | ICD-10-CM | POA: Diagnosis present

## 2020-04-09 DIAGNOSIS — Z79899 Other long term (current) drug therapy: Secondary | ICD-10-CM | POA: Diagnosis not present

## 2020-04-09 DIAGNOSIS — Z20822 Contact with and (suspected) exposure to covid-19: Secondary | ICD-10-CM | POA: Diagnosis present

## 2020-04-09 DIAGNOSIS — O99344 Other mental disorders complicating childbirth: Secondary | ICD-10-CM | POA: Diagnosis present

## 2020-04-09 DIAGNOSIS — Z3490 Encounter for supervision of normal pregnancy, unspecified, unspecified trimester: Secondary | ICD-10-CM | POA: Diagnosis present

## 2020-04-09 DIAGNOSIS — Z3A37 37 weeks gestation of pregnancy: Secondary | ICD-10-CM | POA: Diagnosis not present

## 2020-04-09 LAB — COMPREHENSIVE METABOLIC PANEL
ALT: 7 U/L (ref 0–44)
AST: 35 U/L (ref 15–41)
Albumin: 2.8 g/dL — ABNORMAL LOW (ref 3.5–5.0)
Alkaline Phosphatase: 105 U/L (ref 38–126)
Anion gap: 10 (ref 5–15)
BUN: 7 mg/dL (ref 6–20)
CO2: 22 mmol/L (ref 22–32)
Calcium: 8.8 mg/dL — ABNORMAL LOW (ref 8.9–10.3)
Chloride: 105 mmol/L (ref 98–111)
Creatinine, Ser: 0.44 mg/dL (ref 0.44–1.00)
GFR, Estimated: >60 mL/min (ref >60)
Glucose, Bld: 71 mg/dL (ref 70–99)
Potassium: 4.8 mmol/L (ref 3.5–5.1)
Sodium: 137 mmol/L (ref 135–145)
Total Bilirubin: 0.9 mg/dL (ref 0.3–1.2)
Total Protein: 6 g/dL — ABNORMAL LOW (ref 6.5–8.1)

## 2020-04-09 LAB — CBC
HCT: 36.9 % (ref 36.0–46.0)
Hemoglobin: 12.1 g/dL (ref 12.0–15.0)
MCH: 29.8 pg (ref 26.0–34.0)
MCHC: 32.8 g/dL (ref 30.0–36.0)
MCV: 90.9 fL (ref 80.0–100.0)
Platelets: 207 10*3/uL (ref 150–400)
RBC: 4.06 MIL/uL (ref 3.87–5.11)
RDW: 12.6 % (ref 11.5–15.5)
WBC: 9.6 10*3/uL (ref 4.0–10.5)
nRBC: 0 % (ref 0.0–0.2)

## 2020-04-09 LAB — TYPE AND SCREEN
ABO/RH(D): A POS
Antibody Screen: NEGATIVE

## 2020-04-09 LAB — RPR: RPR Ser Ql: NONREACTIVE

## 2020-04-09 LAB — PROTEIN / CREATININE RATIO, URINE
Creatinine, Urine: 80.88 mg/dL
Protein Creatinine Ratio: 0.14 mg/mg{Cre} (ref 0.00–0.15)
Total Protein, Urine: 11 mg/dL

## 2020-04-09 MED ORDER — LACTATED RINGERS IV SOLN: INTRAVENOUS | Status: DC

## 2020-04-09 MED ORDER — SENNOSIDES-DOCUSATE SODIUM 8.6-50 MG PO TABS
2.0000 | ORAL_TABLET | Freq: Every day | ORAL | Status: DC
  Administered 2020-04-10 – 2020-04-11 (×2): 2 via ORAL
  Filled 2020-04-09 (×2): qty 2

## 2020-04-09 MED ORDER — OXYTOCIN-SODIUM CHLORIDE 30-0.9 UT/500ML-% IV SOLN
1.0000 m[IU]/min | INTRAVENOUS | Status: DC
  Administered 2020-04-09: 2 m[IU]/min via INTRAVENOUS
  Filled 2020-04-09: qty 500

## 2020-04-09 MED ORDER — BUTORPHANOL TARTRATE 1 MG/ML IJ SOLN: 1.0000 mg | Freq: Once | INTRAMUSCULAR | Status: AC

## 2020-04-09 MED ORDER — BUTORPHANOL TARTRATE 1 MG/ML IJ SOLN
INTRAMUSCULAR | Status: AC
  Administered 2020-04-09: 1 mg via INTRAVENOUS
  Filled 2020-04-09: qty 1

## 2020-04-09 MED ORDER — TETANUS-DIPHTH-ACELL PERTUSSIS 5-2.5-18.5 LF-MCG/0.5 IM SUSY: 0.5000 mL | PREFILLED_SYRINGE | Freq: Once | INTRAMUSCULAR | Status: DC

## 2020-04-09 MED ORDER — LABETALOL HCL 5 MG/ML IV SOLN: 80.0000 mg | INTRAVENOUS | Status: DC | PRN

## 2020-04-09 MED ORDER — OXYCODONE-ACETAMINOPHEN 5-325 MG PO TABS: 1.0000 | ORAL_TABLET | ORAL | Status: DC | PRN

## 2020-04-09 MED ORDER — OXYCODONE HCL 5 MG PO TABS: 10.0000 mg | ORAL_TABLET | ORAL | Status: DC | PRN

## 2020-04-09 MED ORDER — LABETALOL HCL 5 MG/ML IV SOLN: 20.0000 mg | INTRAVENOUS | Status: DC | PRN

## 2020-04-09 MED ORDER — OXYTOCIN BOLUS FROM INFUSION
333.0000 mL | Freq: Once | INTRAVENOUS | Status: AC
  Administered 2020-04-09: 333 mL via INTRAVENOUS

## 2020-04-09 MED ORDER — ONDANSETRON HCL 4 MG/2ML IJ SOLN: 4.0000 mg | Freq: Four times a day (QID) | INTRAMUSCULAR | Status: DC | PRN

## 2020-04-09 MED ORDER — SOD CITRATE-CITRIC ACID 500-334 MG/5ML PO SOLN: 30.0000 mL | ORAL | Status: DC | PRN

## 2020-04-09 MED ORDER — ZOLPIDEM TARTRATE 5 MG PO TABS: 5.0000 mg | ORAL_TABLET | Freq: Every evening | ORAL | Status: DC | PRN

## 2020-04-09 MED ORDER — LACTATED RINGERS IV SOLN: 500.0000 mL | INTRAVENOUS | Status: DC | PRN

## 2020-04-09 MED ORDER — DIPHENHYDRAMINE HCL 25 MG PO CAPS
25.0000 mg | ORAL_CAPSULE | Freq: Four times a day (QID) | ORAL | Status: DC | PRN
Start: 2020-04-09 — End: 2020-04-12

## 2020-04-09 MED ORDER — OXYCODONE-ACETAMINOPHEN 5-325 MG PO TABS: 2.0000 | ORAL_TABLET | ORAL | Status: DC | PRN

## 2020-04-09 MED ORDER — ONDANSETRON HCL 4 MG PO TABS: 4.0000 mg | ORAL_TABLET | ORAL | Status: DC | PRN

## 2020-04-09 MED ORDER — DIBUCAINE (PERIANAL) 1 % EX OINT: 1.0000 "application " | TOPICAL_OINTMENT | CUTANEOUS | Status: DC | PRN

## 2020-04-09 MED ORDER — OXYCODONE HCL 5 MG PO TABS
5.0000 mg | ORAL_TABLET | ORAL | Status: DC | PRN
  Administered 2020-04-10 – 2020-04-11 (×3): 5 mg via ORAL
  Filled 2020-04-09 (×3): qty 1

## 2020-04-09 MED ORDER — ACETAMINOPHEN 325 MG PO TABS
650.0000 mg | ORAL_TABLET | ORAL | Status: DC | PRN
  Administered 2020-04-09 – 2020-04-11 (×3): 650 mg via ORAL
  Filled 2020-04-09 (×3): qty 2

## 2020-04-09 MED ORDER — LIDOCAINE HCL (PF) 1 % IJ SOLN: 30.0000 mL | INTRAMUSCULAR | Status: DC | PRN

## 2020-04-09 MED ORDER — BENZOCAINE-MENTHOL 20-0.5 % EX AERO: 1.0000 "application " | INHALATION_SPRAY | CUTANEOUS | Status: DC | PRN

## 2020-04-09 MED ORDER — LABETALOL HCL 5 MG/ML IV SOLN: 40.0000 mg | INTRAVENOUS | Status: DC | PRN

## 2020-04-09 MED ORDER — SIMETHICONE 80 MG PO CHEW: 80.0000 mg | CHEWABLE_TABLET | ORAL | Status: DC | PRN

## 2020-04-09 MED ORDER — WITCH HAZEL-GLYCERIN EX PADS: 1.0000 "application " | MEDICATED_PAD | CUTANEOUS | Status: DC | PRN

## 2020-04-09 MED ORDER — PRENATAL MULTIVITAMIN CH
1.0000 | ORAL_TABLET | Freq: Every day | ORAL | Status: DC
  Administered 2020-04-10 – 2020-04-11 (×2): 1 via ORAL
  Filled 2020-04-09 (×2): qty 1

## 2020-04-09 MED ORDER — IBUPROFEN 600 MG PO TABS
600.0000 mg | ORAL_TABLET | Freq: Four times a day (QID) | ORAL | Status: DC
  Administered 2020-04-09 – 2020-04-11 (×9): 600 mg via ORAL
  Filled 2020-04-09 (×9): qty 1

## 2020-04-09 MED ORDER — ACETAMINOPHEN 325 MG PO TABS: 650.0000 mg | ORAL_TABLET | ORAL | Status: DC | PRN

## 2020-04-09 MED ORDER — OXYTOCIN-SODIUM CHLORIDE 30-0.9 UT/500ML-% IV SOLN: 2.5000 [IU]/h | INTRAVENOUS | Status: DC

## 2020-04-09 MED ORDER — HYDRALAZINE HCL 20 MG/ML IJ SOLN: 10.0000 mg | INTRAMUSCULAR | Status: DC | PRN

## 2020-04-09 MED ORDER — COCONUT OIL OIL
1.0000 "application " | TOPICAL_OIL | Status: DC | PRN
  Administered 2020-04-10: 1 via TOPICAL

## 2020-04-09 MED ORDER — TERBUTALINE SULFATE 1 MG/ML IJ SOLN: 0.2500 mg | Freq: Once | INTRAMUSCULAR | Status: DC | PRN

## 2020-04-09 MED ORDER — ONDANSETRON HCL 4 MG/2ML IJ SOLN: 4.0000 mg | INTRAMUSCULAR | Status: DC | PRN

## 2020-04-09 NOTE — H&P (Signed)
Anita Potter is a 34 y.S.[id number] female presenting for scheduled iol due to ghtn. Pt is dated per LMP; confirmed with a 10week [place]. She has a history of anxiety - on vistaril; and back pain that complicated pregnancy. Pt developed covid on 03/19/20 and reports fatigue since then. She is GBS neg.  Pt noted to have elevated BP at last visit; preE labs wnl. She also had a history of ghtn in previous pregnancy. Has been using baby aspirin. OB History    Gravida  4   Para  2   Term  2   Preterm      AB  1   Living  2     SAB  1   IAB      Ectopic      Multiple  0   Live Births  2          Past Medical History:  Diagnosis Date  . Chronic hypertension 05/18/2019  . Hx of chlamydia infection 03/2016  . Infection    UTI  . Postpartum hypertension 07/09/2016  . Pregnancy induced hypertension    meds PP for 6-8wks  . Uterine fibroid    Past Surgical History:  Procedure Laterality Date  . NO PAST SURGERIES     Family History: family history includes Cancer in her father; Hypertension in her mother. Social History:  reports that she has been smoking cigarettes. She has been smoking about 0.50 packs per day. She has never used smokeless tobacco. She reports previous alcohol use. She reports that she does not use drugs.     Maternal Diabetes: No Genetic Screening: Declined Maternal Ultrasounds/Referrals: Normal Fetal Ultrasounds or other Referrals:  None Maternal Substance Abuse:  Yes:  Type: Smoker Significant Maternal Medications:  None Significant Maternal Lab Results:  Group B Strep negative Other Comments:  None  Review of Systems  Constitutional: Positive for activity change and fatigue.  Eyes: Negative for photophobia and visual disturbance.  Respiratory: Negative for cough, chest tightness and shortness of breath.   Cardiovascular: Negative for chest pain, palpitations and leg swelling.  Gastrointestinal: Positive for abdominal pain.  Genitourinary:  Positive for flank pain and pelvic pain.  Musculoskeletal: Positive for myalgias.  Neurological: Negative for light-headedness, numbness and headaches.  Psychiatric/Behavioral: The patient is nervous/anxious.    Maternal Medical History:  Reason for admission: ghtn  Contractions: Onset was 1 week or more ago.   Frequency: irregular.   Perceived severity is mild.    Fetal activity: Perceived fetal activity is normal.   Last perceived fetal movement was within the past hour.    Prenatal complications: PIH.   Prenatal Complications - Diabetes: none.    Dilation: 5 Effacement (%): 70 Station: -3 Exam by:: Dr. Terri Potter Last menstrual period 07/17/2019, unknown if currently breastfeeding. Maternal Exam:  Uterine Assessment: Contraction strength is mild.  Contraction frequency is irregular.   Abdomen: Patient reports generalized tenderness.  Estimated fetal weight is AGA.   Fetal presentation: vertex  Introitus: Normal vulva. Vulva is negative for condylomata and lesion.  Normal vagina.  Vagina is negative for condylomata.  Amniotic fluid character: clear.  Pelvis: adequate for delivery.   Cervix: Cervix evaluated by digital exam.     Fetal Exam Fetal Monitor Review: Baseline rate: 120.  Variability: moderate (6-25 bpm).   Pattern: accelerations present and no decelerations.    Fetal State Assessment: Category I - tracings are normal.     Physical Exam Vitals and nursing note reviewed. Exam conducted with  a chaperone present.  Constitutional:      Appearance: Normal appearance. She is normal weight.  Cardiovascular:     Rate and Rhythm: Normal rate.     Pulses: Normal pulses.  Pulmonary:     Effort: Pulmonary effort is normal.  Abdominal:     Tenderness: There is generalized abdominal tenderness.  Genitourinary:    General: Normal vulva.  Vulva is no lesion.  Musculoskeletal:        General: Normal range of motion.     Cervical back: Normal range of motion.   Skin:    General: Skin is warm and dry.     Capillary Refill: Capillary refill takes 2 to 3 seconds.  Neurological:     General: No focal deficit present.     Mental Status: She is alert and oriented to person, place, and time. Mental status is at baseline.  Psychiatric:        Mood and Affect: Mood normal.        Behavior: Behavior normal.        Thought Content: Thought content normal.        Judgment: Judgment normal.     Prenatal labs: ABO, Rh:   Antibody:   Rubella:   RPR: NON REACTIVE (06/07 1811)  HBsAg:    HIV: Non Reactive (06/07 1811)  GBS:     Assessment/Plan: [id number] [id number] female here for iol due to ghtn -[place] covid neg -GBS neg - AROM - clear - Augment with pitocin if no spontaneous labor in next 1-2 weeks - Anticipate svd - Pain control prn   Anita Potter 04/09/2020, 9:17 AM

## 2020-04-09 NOTE — Lactation Note (Signed)
Lactation Consultation Note  Patient Name: Anita Potter TIWPY'K Date: 04/09/2020   Age:35 y.o.  Mom breastfeeding on arrival.  Observed infant feeding.  He was breastfeeding well.  Mom reports she exclusively pumped for her first baby for 7-8 months he would not latch.  Mom breastfed her second baby for 9 months and then had stored breastmilk she fed for 1 month longer. Praised breastfeeding.  Urged to call lactation as needed.  Maternal Data    Feeding    LATCH Score                   Interventions    Lactation Tools Discussed/Used     Consult Status      Akela Pocius Thompson Caul 04/09/2020, 5:24 PM

## 2020-04-10 LAB — CBC
HCT: 33 % — ABNORMAL LOW (ref 36.0–46.0)
Hemoglobin: 11.1 g/dL — ABNORMAL LOW (ref 12.0–15.0)
MCH: 30.2 pg (ref 26.0–34.0)
MCHC: 33.6 g/dL (ref 30.0–36.0)
MCV: 89.7 fL (ref 80.0–100.0)
Platelets: 209 10*3/uL (ref 150–400)
RBC: 3.68 MIL/uL — ABNORMAL LOW (ref 3.87–5.11)
RDW: 12.6 % (ref 11.5–15.5)
WBC: 14 10*3/uL — ABNORMAL HIGH (ref 4.0–10.5)
nRBC: 0 % (ref 0.0–0.2)

## 2020-04-10 NOTE — Progress Notes (Signed)
Post Partum Day 1 Subjective: no complaints, up ad lib and tolerating PO   No HA or PIH sx Objective: Blood pressure 133/86, pulse 74, temperature 98.2 F (36.8 C), temperature source Oral, resp. rate 16, height 5\' 5"  (1.651 m), weight 108.6 kg, last menstrual period 07/17/2019, SpO2 100 %, unknown if currently breastfeeding.  Physical Exam:  General: alert and cooperative Lochia: appropriate Uterine Fundus: firm   Recent Labs    04/09/20 0819 04/10/20 0550  HGB 12.1 11.1*  HCT 36.9 33.0*    Assessment/Plan: Plan for discharge tomorrow and will monitor BP today, acceptable thus far, but high normal Pt desires circumcision and baby feeding well--will set up with nursery   LOS: 1 day   Logan Bores 04/10/2020, 9:53 AM

## 2020-04-10 NOTE — Progress Notes (Signed)
MOB was referred for history of depression/anxiety. * Referral screened out by Clinical Social Worker because none of the following criteria appear to apply: ~ History of anxiety/depression during this pregnancy, or of post-partum depression following prior delivery. ~ Diagnosis of anxiety and/or depression within last 3 years OR * MOB's symptoms currently being treated with medication and/or therapy. MOB has an active Rx for Vistaril.   Please contact the Clinical Social Worker if needs arise, by [facility] request, or if MOB scores greater than 9/yes to question 10 on Edinburgh Postpartum Depression Screen.  Laurey Arrow, MSW, LCSW Clinical Social Work [phone]

## 2020-04-11 LAB — HEPATIC FUNCTION PANEL
ALT: 17 U/L (ref 0–44)
AST: 24 U/L (ref 15–41)
Albumin: 2.6 g/dL — ABNORMAL LOW (ref 3.5–5.0)
Alkaline Phosphatase: 100 U/L (ref 38–126)
Bilirubin, Direct: 0.1 mg/dL (ref 0.0–0.2)
Indirect Bilirubin: 0.3 mg/dL (ref 0.3–0.9)
Total Bilirubin: 0.4 mg/dL (ref 0.3–1.2)
Total Protein: 5.7 g/dL — ABNORMAL LOW (ref 6.5–8.1)

## 2020-04-11 LAB — CBC
HCT: 33.3 % — ABNORMAL LOW (ref 36.0–46.0)
Hemoglobin: 11.4 g/dL — ABNORMAL LOW (ref 12.0–15.0)
MCH: 30.8 pg (ref 26.0–34.0)
MCHC: 34.2 g/dL (ref 30.0–36.0)
MCV: 90 fL (ref 80.0–100.0)
Platelets: 229 10*3/uL (ref 150–400)
RBC: 3.7 MIL/uL — ABNORMAL LOW (ref 3.87–5.11)
RDW: 13 % (ref 11.5–15.5)
WBC: 10.1 10*3/uL (ref 4.0–10.5)
nRBC: 0 % (ref 0.0–0.2)

## 2020-04-11 LAB — URIC ACID: Uric Acid, Serum: 4.9 mg/dL (ref 2.5–7.1)

## 2020-04-11 LAB — PROTEIN / CREATININE RATIO, URINE
Creatinine, Urine: 109.94 mg/dL
Protein Creatinine Ratio: 0.1 mg/mg{Cre} (ref 0.00–0.15)
Total Protein, Urine: 11 mg/dL

## 2020-04-11 LAB — LACTATE DEHYDROGENASE: LDH: 247 U/L — ABNORMAL HIGH (ref 98–192)

## 2020-04-11 MED ORDER — NIFEDIPINE ER OSMOTIC RELEASE 30 MG PO TB24
30.0000 mg | ORAL_TABLET | Freq: Every day | ORAL | Status: DC
  Administered 2020-04-11: 30 mg via ORAL
  Filled 2020-04-11: qty 1

## 2020-04-11 MED ORDER — IBUPROFEN 600 MG PO TABS: 600.0000 mg | ORAL_TABLET | Freq: Four times a day (QID) | ORAL | 1 refills | Status: DC | PRN

## 2020-04-11 MED ORDER — NIFEDIPINE ER 30 MG PO TB24: 30.0000 mg | ORAL_TABLET | Freq: Every day | ORAL | 2 refills | Status: DC

## 2020-04-11 NOTE — Discharge Instructions (Signed)
Call office with any concerns [phone] 

## 2020-04-11 NOTE — Progress Notes (Addendum)
Patient ID: Anita Potter, female   DOB: 01-Oct-1985, 35 y.o.   MRN: [id number] Pt reports mild HA this am and slight discomfort due to cramping pain. She is otherwise ambulating, voiding, tolerating diet well. She is bonding well with baby - breastfeeding. She denies CP, SOB or lightheadedness. She would like discharge to home if possible later today VS: 145/89-93, 87 GEN - NAD,  ABD - FF  EXT - no homans   A/P: PPD#2 svd, ghtn          BP elevated over her baseline today          - Check preE labs and start on procardia 30xl now; vitals q 4hrs           - If labs wnl and BP controlled, agreed to discharge to home later today          - If labs concerning for PreE or BP not controlled will treat as indicated         Routine pp care

## 2020-04-11 NOTE — Discharge Summary (Signed)
Postpartum Discharge Summary  Date of Service updated      Patient Name: Anita Potter DOB: 12/01/1985 MRN: [id number]  Date of admission: 04/09/2020 Delivery date:04/09/2020  Delivering provider: Carlynn Purl [facility]  Date of discharge: 04/11/2020  Admitting diagnosis: Encounter for induction of labor [Z34.90] Intrauterine pregnancy: [redacted]w[redacted]d     Secondary diagnosis:  Active Problems:   Encounter for induction of labor   SVD (spontaneous vaginal delivery)  Additional problems: gestational hypertension    Discharge diagnosis: Term Pregnancy Delivered and Gestational Hypertension                                              Post partum procedures:n/a Augmentation: AROM Complications: None  Hospital course: Induction of Labor With Vaginal Delivery   35 y.o. yo [phone] at [redacted]w[redacted]d was admitted to the hospital 04/09/2020 for induction of labor.  Indication for induction: Favorable cervix at term and Gestational hypertension.  Patient had an uncomplicated labor course as follows: Membrane Rupture Time/Date: 9:09 AM ,04/09/2020   Delivery Method:Vaginal, Spontaneous  Episiotomy: None  Lacerations:  None  Details of delivery can be found in separate delivery note.  Patient had a routine postpartum course. Patient is discharged home 04/11/20.  Newborn Data: Birth date:04/09/2020  Birth time:4:11 PM  Gender:Female  Living status:Living  Apgars:9 ,9  Weight:3544 g   Magnesium Sulfate received: No BMZ received: No Rhophylac:N/A  Physical exam  Vitals:   04/10/20 1426 04/10/20 2022 04/11/20 0612 04/11/20 0900  BP: 137/86 127/81 (!) 145/93 (!) 145/89  Pulse: 78 77 68 87  Resp: 16   18  Temp: 98.1 F (36.7 C) 98.3 F (36.8 C) 98.2 F (36.8 C)   TempSrc: Oral Oral    SpO2: 99%     Weight:      Height:       General: alert, cooperative and no distress Lochia: appropriate Uterine Fundus: firm Incision: N/A DVT Evaluation: No evidence of DVT seen on physical exam. No  significant calf/ankle edema. Labs: Lab Results  Component Value Date   WBC 14.0 (H) 04/10/2020   HGB 11.1 (L) 04/10/2020   HCT 33.0 (L) 04/10/2020   MCV 89.7 04/10/2020   PLT 209 04/10/2020   CMP Latest Ref Rng & Units 04/09/2020  Glucose 70 - 99 mg/dL 71  BUN 6 - 20 mg/dL 7  Creatinine 0.44 - 1.00 mg/dL 0.44  Sodium 135 - 145 mmol/L 137  Potassium 3.5 - 5.1 mmol/L 4.8  Chloride 98 - 111 mmol/L 105  CO2 22 - 32 mmol/L 22  Calcium 8.9 - 10.3 mg/dL 8.8(L)  Total Protein 6.5 - 8.1 g/dL 6.0(L)  Total Bilirubin 0.3 - 1.2 mg/dL 0.9  Alkaline Phos 38 - 126 U/L 105  AST 15 - 41 U/L 35  ALT 0 - 44 U/L 7   Edinburgh Score: Edinburgh Postnatal Depression Scale Screening Tool 04/10/2020  I have been able to laugh and see the funny side of things. 0  I have looked forward with enjoyment to things. 0  I have blamed myself unnecessarily when things went wrong. 1  I have been anxious or worried for no good reason. 0  I have felt scared or panicky for no good reason. 0  Things have been getting on top of me. 1  I have been so unhappy that I have had difficulty sleeping. 0  I  have felt sad or miserable. 1  I have been so unhappy that I have been crying. 1  The thought of harming myself has occurred to me. 0  Edinburgh Postnatal Depression Scale Total 4      After visit meds:  Allergies as of 04/11/2020   No Known Allergies     Medication List    STOP taking these medications   aspirin EC 81 MG tablet   ondansetron 4 MG disintegrating tablet Commonly known as: Zofran ODT     TAKE these medications   acetaminophen 325 MG tablet Commonly known as: Tylenol Take 2 tablets (650 mg total) by mouth every 4 (four) hours as needed (for pain scale < 4). What changed: reasons to take this   dicyclomine 10 MG capsule Commonly known as: Bentyl Take 1 capsule (10 mg total) by mouth 3 (three) times daily as needed for up to 3 days for spasms.   ibuprofen 600 MG tablet Commonly known  as: ADVIL Take 1 tablet (600 mg total) by mouth every 6 (six) hours as needed for moderate pain or cramping.   multivitamin-prenatal 27-0.8 MG Tabs tablet Take 1 tablet by mouth daily at 12 noon.        Discharge home in stable condition Infant Feeding: Breast Infant Disposition:home with mother Discharge instruction: per After Visit Summary and Postpartum booklet. Activity: Advance as tolerated. Pelvic rest for 6 weeks.  Diet: low salt diet Anticipated Birth Control: Unsure Postpartum Appointment:6 weeks Additional Postpartum F/U: BP check 4-5 days Future Appointments:No future appointments. Follow up Visit:  [place], [place], DO. Schedule an appointment as soon as possible for a visit in 6 week(s).   Specialty: Obstetrics and Gynecology Why: For postpartum visit  Contact information: [place] [address] [phone]                   04/11/2020 Isaiah Serge, DO

## 2020-04-15 ENCOUNTER — Encounter: Payer: BC Managed Care – PPO | Admitting: Physical Therapy

## 2020-04-19 ENCOUNTER — Encounter: Payer: BC Managed Care – PPO | Admitting: Physical Therapy

## 2020-06-14 ENCOUNTER — Encounter (HOSPITAL_COMMUNITY): Payer: Self-pay | Admitting: *Deleted

## 2020-06-14 ENCOUNTER — Emergency Department (HOSPITAL_COMMUNITY): Payer: BC Managed Care – PPO

## 2020-06-14 ENCOUNTER — Other Ambulatory Visit: Payer: Self-pay

## 2020-06-14 ENCOUNTER — Inpatient Hospital Stay (HOSPITAL_COMMUNITY)
Admission: EM | Admit: 2020-06-14 | Discharge: 2020-06-16 | DRG: 280 | Disposition: A | Discharge status: Home / Self Care | Payer: BC Managed Care – PPO | Attending: Internal Medicine | Admitting: Internal Medicine

## 2020-06-14 DIAGNOSIS — I2542 Coronary artery dissection: Secondary | ICD-10-CM | POA: Diagnosis present

## 2020-06-14 DIAGNOSIS — I214 Non-ST elevation (NSTEMI) myocardial infarction: Secondary | ICD-10-CM | POA: Diagnosis not present

## 2020-06-14 DIAGNOSIS — E8770 Fluid overload, unspecified: Secondary | ICD-10-CM | POA: Diagnosis not present

## 2020-06-14 DIAGNOSIS — I493 Ventricular premature depolarization: Secondary | ICD-10-CM | POA: Diagnosis present

## 2020-06-14 DIAGNOSIS — T461X6A Underdosing of calcium-channel blockers, initial encounter: Secondary | ICD-10-CM | POA: Diagnosis present

## 2020-06-14 DIAGNOSIS — Z716 Tobacco abuse counseling: Secondary | ICD-10-CM

## 2020-06-14 DIAGNOSIS — Z91128 Patient's intentional underdosing of medication regimen for other reason: Secondary | ICD-10-CM

## 2020-06-14 DIAGNOSIS — R079 Chest pain, unspecified: Secondary | ICD-10-CM

## 2020-06-14 DIAGNOSIS — F17210 Nicotine dependence, cigarettes, uncomplicated: Secondary | ICD-10-CM | POA: Diagnosis present

## 2020-06-14 DIAGNOSIS — Y92009 Unspecified place in unspecified non-institutional (private) residence as the place of occurrence of the external cause: Secondary | ICD-10-CM

## 2020-06-14 DIAGNOSIS — I10 Essential (primary) hypertension: Secondary | ICD-10-CM | POA: Diagnosis present

## 2020-06-14 DIAGNOSIS — Z8249 Family history of ischemic heart disease and other diseases of the circulatory system: Secondary | ICD-10-CM

## 2020-06-14 DIAGNOSIS — I472 Ventricular tachycardia: Secondary | ICD-10-CM | POA: Diagnosis not present

## 2020-06-14 DIAGNOSIS — Z20822 Contact with and (suspected) exposure to covid-19: Secondary | ICD-10-CM | POA: Diagnosis present

## 2020-06-14 DIAGNOSIS — Z801 Family history of malignant neoplasm of trachea, bronchus and lung: Secondary | ICD-10-CM

## 2020-06-14 DIAGNOSIS — Z79899 Other long term (current) drug therapy: Secondary | ICD-10-CM

## 2020-06-14 LAB — COMPREHENSIVE METABOLIC PANEL
ALT: 17 U/L (ref 0–44)
AST: 18 U/L (ref 15–41)
Albumin: 3.5 g/dL (ref 3.5–5.0)
Alkaline Phosphatase: 94 U/L (ref 38–126)
Anion gap: 6 (ref 5–15)
BUN: 8 mg/dL (ref 6–20)
CO2: 25 mmol/L (ref 22–32)
Calcium: 8.5 mg/dL — ABNORMAL LOW (ref 8.9–10.3)
Chloride: 107 mmol/L (ref 98–111)
Creatinine, Ser: 0.71 mg/dL (ref 0.44–1.00)
GFR, Estimated: >60 mL/min (ref >60)
Glucose, Bld: 93 mg/dL (ref 70–99)
Potassium: 3.2 mmol/L — ABNORMAL LOW (ref 3.5–5.1)
Sodium: 138 mmol/L (ref 135–145)
Total Bilirubin: 0.4 mg/dL (ref 0.3–1.2)
Total Protein: 6.4 g/dL — ABNORMAL LOW (ref 6.5–8.1)

## 2020-06-14 LAB — CBC WITH DIFFERENTIAL/PLATELET
Abs Immature Granulocytes: 0.01 10*3/uL (ref 0.00–0.07)
Basophils Absolute: 0 10*3/uL (ref 0.0–0.1)
Basophils Relative: 0 %
Eosinophils Absolute: 0.1 10*3/uL (ref 0.0–0.5)
Eosinophils Relative: 2 %
HCT: 36.2 % (ref 36.0–46.0)
Hemoglobin: 12.7 g/dL (ref 12.0–15.0)
Immature Granulocytes: 0 %
Lymphocytes Relative: 30 %
Lymphs Abs: 2.1 10*3/uL (ref 0.7–4.0)
MCH: 30.2 pg (ref 26.0–34.0)
MCHC: 35.1 g/dL (ref 30.0–36.0)
MCV: 86.2 fL (ref 80.0–100.0)
Monocytes Absolute: 0.5 10*3/uL (ref 0.1–1.0)
Monocytes Relative: 7 %
Neutro Abs: 4.1 10*3/uL (ref 1.7–7.7)
Neutrophils Relative %: 61 %
Platelets: 213 10*3/uL (ref 150–400)
RBC: 4.2 MIL/uL (ref 3.87–5.11)
RDW: 12.2 % (ref 11.5–15.5)
WBC: 6.9 10*3/uL (ref 4.0–10.5)
nRBC: 0 % (ref 0.0–0.2)

## 2020-06-14 LAB — LIPASE, BLOOD: Lipase: 28 U/L (ref 11–51)

## 2020-06-14 LAB — D-DIMER, QUANTITATIVE: D-Dimer, Quant: 0.52 ug/mL-FEU — ABNORMAL HIGH (ref 0.00–0.50)

## 2020-06-14 LAB — I-STAT BETA HCG BLOOD, ED (MC, WL, AP ONLY): I-stat hCG, quantitative: <5 m[IU]/mL (ref <5)

## 2020-06-14 LAB — BRAIN NATRIURETIC PEPTIDE: B Natriuretic Peptide: 29.2 pg/mL (ref 0.0–100.0)

## 2020-06-14 LAB — TROPONIN I (HIGH SENSITIVITY): Troponin I (High Sensitivity): 275 ng/L (ref <18)

## 2020-06-14 MED ORDER — IOHEXOL 350 MG/ML SOLN
75.0000 mL | Freq: Once | INTRAVENOUS | Status: AC | PRN
  Administered 2020-06-14: 75 mL via INTRAVENOUS

## 2020-06-14 MED ORDER — ACETAMINOPHEN 325 MG PO TABS
650.0000 mg | ORAL_TABLET | Freq: Once | ORAL | Status: AC
  Administered 2020-06-14: 650 mg via ORAL
  Filled 2020-06-14: qty 2

## 2020-06-14 MED ORDER — FENTANYL CITRATE (PF) 100 MCG/2ML IJ SOLN: 25.0000 ug | Freq: Once | INTRAMUSCULAR | Status: DC

## 2020-06-14 MED ORDER — ONDANSETRON HCL 4 MG/2ML IJ SOLN
4.0000 mg | Freq: Once | INTRAMUSCULAR | Status: AC
  Administered 2020-06-14: 4 mg via INTRAVENOUS
  Filled 2020-06-14: qty 2

## 2020-06-14 MED ORDER — SODIUM CHLORIDE 0.9 % IV BOLUS
1000.0000 mL | Freq: Once | INTRAVENOUS | Status: AC
  Administered 2020-06-14: 1000 mL via INTRAVENOUS

## 2020-06-14 MED ORDER — MORPHINE SULFATE (PF) 2 MG/ML IV SOLN
2.0000 mg | Freq: Once | INTRAVENOUS | Status: DC
  Filled 2020-06-14: qty 1

## 2020-06-14 NOTE — ED Notes (Signed)
Pt transported to CT ?

## 2020-06-14 NOTE — ED Provider Notes (Signed)
35 year old female received at signout from [place] pending admission. Per her HPI:   "35 year old female with a history of hypertension noncompliant with her blood pressure medicines, 2 months postpartum, postpartum hypertension, fibroids presents to the ER with complaints of chest pain.  Onset approximately 2 hours ago, patient states that she was breast-feeding her son and started to feel nauseous and have central chest pain which she describes as "heavy".  The pain does not radiate.  She has some associated mild shortness of breath.  No pleuritic symptoms.  She denies any abdominal pain or vomiting.  Denies any dizziness.  She was found to be hypertensive. In the field with a blood pressure 180/116, states she has not been compliant with her blood pressure medications for the last week.  She has no other prior cardiac history.  She is not on any OCPs, no recent travel.  No prior history of PE. Given 2 nitro's in the firled with some relief as well as Aspirin."   My evaluation, patient is endorsing lightheadedness after ambulating to the bathroom.  She continues to endorse 3-4/10 of chest pressure.  Denies shortness of breath.  Patient is currently breast-feeding.  Physical Exam  BP (!) 143/95   Pulse (!) 54   Temp 98.3 F (36.8 C)   Resp 15   LMP 06/09/2020   SpO2 96%   Physical Exam Vitals and nursing note reviewed.  Constitutional:      Appearance: She is well-developed.     Comments: NAD.  HENT:     Head: Normocephalic and atraumatic.  Cardiovascular:     Rate and Rhythm: Normal rate.     Pulses: Normal pulses.  Pulmonary:     Effort: Pulmonary effort is normal.  Musculoskeletal:        General: Normal range of motion.     Cervical back: Normal range of motion.  Neurological:     Mental Status: She is alert and oriented to person, place, and time.     ED Course/Procedures     Procedures  MDM   35 year old female received at signout from [place] pending medical  admission. Please see her note for further work up and medical decision making.  She has spoke with cardiology who recommends medical admission. Consult appreciated.  Cardiology recommends initiating heparin for uptrending troponins.  Please see consult note for full recommendations.  Spoke with Dr. Cyd Silence who will accept the patient for admission.  After speaking with Dr. Tollie Eth, cardiology recommended starting the patient on heparin, which was ordered by me.  Hospitalist team has been updated.  The patient appears reasonably stabilized for admission considering the current resources, flow, and capabilities available in the ED at this time, and I doubt any other [facility] requiring further screening and/or treatment in the ED prior to admission.      Joanne Gavel, PA-C 28/41/32 [id number]    Delora Fuel, MD 02/72/53 [phone]

## 2020-06-14 NOTE — ED Triage Notes (Signed)
Pt arrived with [facility] for CP tonight that started after nursing her son. Pt reports nausea first then CP with SOB. Pt is 2 months postpartum, is supposed to be taking nifedipine for postpartum hypertension but is not currently taking medication. Pt given 324mg  ASA and nitro x2 with some relief of cp. Initial bp 180/116 with EMS 18g IV to [facility]

## 2020-06-14 NOTE — ED Provider Notes (Addendum)
[facility] EMERGENCY DEPARTMENT Provider Note   CSN: [id number] Arrival date & time: 06/14/20  2120     History Chief Complaint  Patient presents with  . Chest Pain    Anita Potter is a 35 y.o. female.  HPI 35 year old female with a history of hypertension noncompliant with her blood pressure medicines, 2 months postpartum, postpartum hypertension, fibroids presents to the ER with complaints of chest pain.  Onset approximately 2 hours ago, patient states that she was breast-feeding her son and started to feel nauseous and have central chest pain which she describes as "heavy".  The pain does not radiate.  She has some associated mild shortness of breath.  No pleuritic symptoms.  She denies any abdominal pain or vomiting.  Denies any dizziness.  She was found to be hypertensive. In the field with a blood pressure 180/116, states she has not been compliant with her blood pressure medications for the last week.  She has no other prior cardiac history.  She is not on any OCPs, no recent travel.  No prior history of PE. Given 2 nitro's in the firled with some relief as well as Aspirin.   Past Medical History:  Diagnosis Date  . Chronic hypertension 05/18/2019  . Hx of chlamydia infection 03/2016  . Infection    UTI  . Postpartum hypertension 07/09/2016  . Pregnancy induced hypertension    meds PP for 6-8wks  . Uterine fibroid     Patient Active Problem List   Diagnosis Date Noted  . Encounter for induction of labor 04/09/2020  . SVD (spontaneous vaginal delivery) 04/09/2020  . Chronic hypertension 05/18/2019  . Hypertension in pregnancy, antepartum 10/31/2017  . Obesity (BMI 35.0-39.9 without comorbidity) 04/25/2017  . Vertigo 08/15/2013    Past Surgical History:  Procedure Laterality Date  . NO PAST SURGERIES       OB History    Gravida  4   Para  3   Term  3   Preterm      AB  1   Living  3     SAB  1   IAB      Ectopic       Multiple  0   Live Births  3           Family History  Problem Relation Age of Onset  . Hypertension Mother   . Cancer Father        lung    Social History   Tobacco Use  . Smoking status: Current Some Day Smoker    Packs/day: 0.50    Types: Cigarettes  . Smokeless tobacco: Never Used  Vaping Use  . Vaping Use: Never used  Substance Use Topics  . Alcohol use: Not Currently    Comment: none since pregnancy  . Drug use: No    Home Medications Prior to Admission medications   Medication Sig Start Date End Date Taking? Authorizing Provider  acetaminophen (TYLENOL) 325 MG tablet Take 2 tablets (650 mg total) by mouth every 4 (four) hours as needed (for pain scale < 4). Patient taking differently: Take 650 mg by mouth every 4 (four) hours as needed for mild pain or headache. 11/02/17   Aletha Halim, MD  dicyclomine (BENTYL) 10 MG capsule Take 1 capsule (10 mg total) by mouth 3 (three) times daily as needed for up to 3 days for spasms. 12/02/19 12/05/19  Jorje Guild, NP  hydrOXYzine (ATARAX/VISTARIL) 25 MG tablet Take 25 mg by mouth  every 6 (six) hours as needed for itching.    [provider]  ibuprofen (ADVIL) 600 MG tablet Take 1 tablet (600 mg total) by mouth every 6 (six) hours as needed for moderate pain or cramping. 04/11/20   Sherlyn Hay, DO  NIFEdipine (ADALAT CC) 30 MG 24 hr tablet Take 1 tablet (30 mg total) by mouth daily. 04/12/20   Banga, Bonnee Quin, DO  Prenatal Vit-Fe Fumarate-FA (MULTIVITAMIN-PRENATAL) 27-0.8 MG TABS tablet Take 1 tablet by mouth daily at 12 noon.    [provider]    Allergies    Patient has no known allergies.  Review of Systems   Review of Systems  Constitutional: Negative for chills and fever.  HENT: Negative for ear pain and sore throat.   Eyes: Negative for pain and visual disturbance.  Respiratory: Negative for cough and shortness of breath.   Cardiovascular: Positive for chest pain. Negative for  palpitations.  Gastrointestinal: Positive for nausea. Negative for abdominal pain and vomiting.  Genitourinary: Negative for dysuria and hematuria.  Musculoskeletal: Negative for arthralgias and back pain.  Skin: Negative for color change and rash.  Neurological: Negative for seizures and syncope.  All other systems reviewed and are negative.   Physical Exam Updated Vital Signs BP (!) 149/98   Pulse (!) 56   Temp 98.3 F (36.8 C)   Resp 15   LMP 06/09/2020   SpO2 99%   Physical Exam Vitals and nursing note reviewed.  Constitutional:      General: She is not in acute distress.    Appearance: She is well-developed.  HENT:     Head: Normocephalic and atraumatic.  Eyes:     Conjunctiva/sclera: Conjunctivae normal.  Cardiovascular:     Rate and Rhythm: Normal rate and regular rhythm.     Heart sounds: Normal heart sounds. No murmur heard.   Pulmonary:     Effort: Pulmonary effort is normal. No respiratory distress.     Breath sounds: Normal breath sounds. No decreased breath sounds, wheezing or rhonchi.  Abdominal:     Palpations: Abdomen is soft.     Tenderness: There is no abdominal tenderness.  Musculoskeletal:        General: Normal range of motion.     Cervical back: Neck supple.     Right lower leg: No tenderness. No edema.     Left lower leg: No tenderness. No edema.  Skin:    General: Skin is warm and dry.  Neurological:     General: No focal deficit present.     Mental Status: She is alert.  Psychiatric:        Mood and Affect: Mood normal.        Behavior: Behavior normal.     ED Results / Procedures / Treatments   Labs (all labs ordered are listed, but only abnormal results are displayed) Labs Reviewed  COMPREHENSIVE METABOLIC PANEL - Abnormal; Notable for the following components:      Result Value   Potassium 3.2 (*)    Calcium 8.5 (*)    Total Protein 6.4 (*)    All other components within normal limits  D-DIMER, QUANTITATIVE - Abnormal;  Notable for the following components:   D-Dimer, Quant 0.52 (*)    All other components within normal limits  TROPONIN I (HIGH SENSITIVITY) - Abnormal; Notable for the following components:   Troponin I (High Sensitivity) 275 (*)    All other components within normal limits  RESP PANEL BY RT-PCR (  FLU A&B, COVID) ARPGX2  CBC WITH DIFFERENTIAL/PLATELET  LIPASE, BLOOD  BRAIN NATRIURETIC PEPTIDE  I-STAT BETA HCG BLOOD, ED ([facility], [facility], [facility] ONLY)  TROPONIN I (HIGH SENSITIVITY)    EKG EKG Interpretation  Date/Time:  Monday June 14 2020 21:27:03 EDT Ventricular Rate:  61 PR Interval:    QRS Duration: 77 QT Interval:  416 QTC Calculation: 419 R Axis:   86 Text Interpretation: Sinus rhythm Anteroseptal infarct, age indeterminate Lateral leads are also involved when compared to prior, similar apperance. No STEM Confirmed by Antony Blackbird [phone]) on 06/14/2020 10:21:45 PM   Radiology CT Angio Chest PE W and/or Wo Contrast  Result Date: 06/14/2020 CLINICAL DATA:  Nausea and chest pain after nursing sign EXAM: CT ANGIOGRAPHY CHEST WITH CONTRAST TECHNIQUE: Multidetector CT imaging of the chest was performed using the standard protocol during bolus administration of intravenous contrast. Multiplanar CT image reconstructions and MIPs were obtained to evaluate the vascular anatomy. CONTRAST:  75 mL Omnipaque 350 IV COMPARISON:  Radiograph 06/14/2020 FINDINGS: Cardiovascular: Satisfactory opacification the pulmonary arteries to the segmental level. No pulmonary artery filling defects are identified. Central pulmonary arteries are normal caliber. Normal heart size. No sizable pericardial effusion with a trace amount of fluid in the pericardial recesses within physiologic normal. Suboptimal opacification of the thoracic aorta for luminal assessment. No gross abnormality of the normal caliber thoracic aorta and normally branching proximal great vessels. Slight reflux of contrast into the IVC and hepatic veins.  No other major venous abnormality. Mediastinum/Nodes: No mediastinal fluid or gas. Normal thyroid gland and thoracic inlet. No acute abnormality of the trachea or esophagus. No worrisome mediastinal, hilar or axillary adenopathy. Lungs/Pleura: No consolidation, features of edema, pneumothorax, or effusion. No suspicious pulmonary nodules or masses. Upper Abdomen: No acute abnormalities present in the visualized portions of the upper abdomen. Musculoskeletal: No acute osseous abnormality or suspicious osseous lesion. No worrisome chest wall masses or lesions. Prominence of the glandular tissue in the breast compatible with lactation related changes. Review of the MIP images confirms the above findings. IMPRESSION: 1. No evidence of pulmonary embolism. 2. Slight reflux of contrast into the IVC and hepatic veins. Finding is nonspecific though can be seen with elevated right heart pressures. 3. No other acute intrathoracic process. Electronically Signed   By: Lovena Le M.D.   On: 06/14/2020 23:29   DG Chest Portable 1 View  Result Date: 06/14/2020 CLINICAL DATA:  Chest pain. EXAM: PORTABLE CHEST 1 VIEW COMPARISON:  Radiograph 06/07/2007 FINDINGS: The cardiomediastinal contours are normal. The lungs are clear. Pulmonary vasculature is normal. No consolidation, pleural effusion, or pneumothorax. No acute osseous abnormalities are seen. IMPRESSION: Negative AP view of the chest. Electronically Signed   By: Keith Rake M.D.   On: 06/14/2020 21:48    Procedures Procedures   Medications Ordered in ED Medications  fentaNYL (SUBLIMAZE) injection 25 mcg (25 mcg Intravenous Not Given 06/14/20 2207)  sodium chloride 0.9 % bolus 1,000 mL (0 mLs Intravenous Stopped 06/14/20 2309)  ondansetron (ZOFRAN) injection 4 mg (4 mg Intravenous Given 06/14/20 2148)  acetaminophen (TYLENOL) tablet 650 mg (650 mg Oral Given 06/14/20 2205)  iohexol (OMNIPAQUE) 350 MG/ML injection 75 mL (75 mLs Intravenous Contrast Given  06/14/20 2319)    ED Course  I have reviewed the triage vital signs and the nursing notes.  Pertinent labs & imaging results that were available during my care of the patient were reviewed by me and considered in my medical decision making (see chart for details).  MDM Rules/Calculators/A&P                          35 year old female who presents to the ER with chest pain.  On arrival, blood pressure improved at 149/87, afebrile, not tachycardic, tachypneic or hypoxic.  Physical exam unremarkable.  DDx includes anxiety, GERD, ACS, PE, postpartum cardiomyopathy  Labs and imaging ordered, reviewed and interpreted by me  CBC unremarkable, CMP with mild hypokalemia 3.2, but otherwise with no significant findings.  Her D-dimer is elevated at 0.52.  Lipase is normal.  Most notably, initial troponin of 275.  EKG reviewed by myself and my supervising physician, with some evidence of LVH but no evidence of STEMI.  PE study negative.  Normal BNP.  Question ACS vs post partum cardiomyopathy vs takotsubo's  Spoke with Dr. Einar Gip who requests the patient be admitted to medicine, he will come see the patient.  Signed out care of the patient to Margie Ege, who will oversee the hospitalist consult and admit the patient   Final Clinical Impression(s) / ED Diagnoses Final diagnoses:  Chest pain, unspecified type    Rx / DC Orders ED Discharge Orders    None          Lyndel Safe 06/14/20 2354    Tegeler, Gwenyth Allegra, MD 06/14/20 [phone]

## 2020-06-15 ENCOUNTER — Other Ambulatory Visit: Payer: Self-pay

## 2020-06-15 ENCOUNTER — Encounter (HOSPITAL_COMMUNITY): Payer: Self-pay | Admitting: Internal Medicine

## 2020-06-15 ENCOUNTER — Encounter (HOSPITAL_COMMUNITY)
Admission: EM | Disposition: A | Discharge status: Home / Self Care | Payer: Self-pay | Source: Home / Self Care | Attending: Internal Medicine

## 2020-06-15 ENCOUNTER — Inpatient Hospital Stay (HOSPITAL_COMMUNITY): Payer: BC Managed Care – PPO

## 2020-06-15 DIAGNOSIS — Z801 Family history of malignant neoplasm of trachea, bronchus and lung: Secondary | ICD-10-CM | POA: Diagnosis not present

## 2020-06-15 DIAGNOSIS — T461X6A Underdosing of calcium-channel blockers, initial encounter: Secondary | ICD-10-CM | POA: Diagnosis present

## 2020-06-15 DIAGNOSIS — Z8249 Family history of ischemic heart disease and other diseases of the circulatory system: Secondary | ICD-10-CM | POA: Diagnosis not present

## 2020-06-15 DIAGNOSIS — F17210 Nicotine dependence, cigarettes, uncomplicated: Secondary | ICD-10-CM | POA: Diagnosis not present

## 2020-06-15 DIAGNOSIS — I10 Essential (primary) hypertension: Secondary | ICD-10-CM | POA: Diagnosis present

## 2020-06-15 DIAGNOSIS — Z716 Tobacco abuse counseling: Secondary | ICD-10-CM | POA: Diagnosis not present

## 2020-06-15 DIAGNOSIS — Y92009 Unspecified place in unspecified non-institutional (private) residence as the place of occurrence of the external cause: Secondary | ICD-10-CM | POA: Diagnosis not present

## 2020-06-15 DIAGNOSIS — I214 Non-ST elevation (NSTEMI) myocardial infarction: Principal | ICD-10-CM

## 2020-06-15 DIAGNOSIS — I2510 Atherosclerotic heart disease of native coronary artery without angina pectoris: Secondary | ICD-10-CM | POA: Diagnosis not present

## 2020-06-15 DIAGNOSIS — R7989 Other specified abnormal findings of blood chemistry: Secondary | ICD-10-CM | POA: Insufficient documentation

## 2020-06-15 DIAGNOSIS — I361 Nonrheumatic tricuspid (valve) insufficiency: Secondary | ICD-10-CM | POA: Diagnosis not present

## 2020-06-15 DIAGNOSIS — I493 Ventricular premature depolarization: Secondary | ICD-10-CM | POA: Diagnosis present

## 2020-06-15 DIAGNOSIS — Z79899 Other long term (current) drug therapy: Secondary | ICD-10-CM | POA: Diagnosis not present

## 2020-06-15 DIAGNOSIS — E8770 Fluid overload, unspecified: Secondary | ICD-10-CM | POA: Diagnosis not present

## 2020-06-15 DIAGNOSIS — Z20822 Contact with and (suspected) exposure to covid-19: Secondary | ICD-10-CM | POA: Diagnosis present

## 2020-06-15 DIAGNOSIS — I472 Ventricular tachycardia: Secondary | ICD-10-CM | POA: Diagnosis not present

## 2020-06-15 DIAGNOSIS — R778 Other specified abnormalities of plasma proteins: Secondary | ICD-10-CM | POA: Insufficient documentation

## 2020-06-15 DIAGNOSIS — I2542 Coronary artery dissection: Secondary | ICD-10-CM

## 2020-06-15 DIAGNOSIS — Z8759 Personal history of other complications of pregnancy, childbirth and the puerperium: Secondary | ICD-10-CM | POA: Insufficient documentation

## 2020-06-15 DIAGNOSIS — R079 Chest pain, unspecified: Secondary | ICD-10-CM | POA: Diagnosis present

## 2020-06-15 DIAGNOSIS — Z91128 Patient's intentional underdosing of medication regimen for other reason: Secondary | ICD-10-CM | POA: Diagnosis not present

## 2020-06-15 HISTORY — PX: LEFT HEART CATH AND CORONARY ANGIOGRAPHY: CATH118249

## 2020-06-15 HISTORY — PX: CARDIAC CATHETERIZATION: SHX172

## 2020-06-15 HISTORY — DX: Personal history of other complications of pregnancy, childbirth and the puerperium: Z87.59

## 2020-06-15 LAB — TROPONIN I (HIGH SENSITIVITY)
Troponin I (High Sensitivity): 14231 ng/L (ref <18)
Troponin I (High Sensitivity): 1806 ng/L (ref <18)
Troponin I (High Sensitivity): 3285 ng/L (ref <18)
Troponin I (High Sensitivity): 4130 ng/L (ref <18)

## 2020-06-15 LAB — COMPREHENSIVE METABOLIC PANEL
ALT: 27 U/L (ref 0–44)
AST: 57 U/L — ABNORMAL HIGH (ref 15–41)
Albumin: 3.6 g/dL (ref 3.5–5.0)
Alkaline Phosphatase: 97 U/L (ref 38–126)
Anion gap: 7 (ref 5–15)
BUN: <5 mg/dL — ABNORMAL LOW (ref 6–20)
CO2: 25 mmol/L (ref 22–32)
Calcium: 8.4 mg/dL — ABNORMAL LOW (ref 8.9–10.3)
Chloride: 106 mmol/L (ref 98–111)
Creatinine, Ser: 0.57 mg/dL (ref 0.44–1.00)
GFR, Estimated: >60 mL/min (ref >60)
Glucose, Bld: 80 mg/dL (ref 70–99)
Potassium: 3.6 mmol/L (ref 3.5–5.1)
Sodium: 138 mmol/L (ref 135–145)
Total Bilirubin: 0.7 mg/dL (ref 0.3–1.2)
Total Protein: 6.7 g/dL (ref 6.5–8.1)

## 2020-06-15 LAB — MAGNESIUM: Magnesium: 1.8 mg/dL (ref 1.7–2.4)

## 2020-06-15 LAB — LIPID PANEL
Cholesterol: 182 mg/dL (ref 0–200)
HDL: 41 mg/dL (ref >40)
LDL Cholesterol: 126 mg/dL — ABNORMAL HIGH (ref 0–99)
Total CHOL/HDL Ratio: 4.4 RATIO
Triglycerides: 75 mg/dL (ref <150)
VLDL: 15 mg/dL (ref 0–40)

## 2020-06-15 LAB — RESP PANEL BY RT-PCR (FLU A&B, COVID) ARPGX2
Influenza A by PCR: NEGATIVE
Influenza B by PCR: NEGATIVE
SARS Coronavirus 2 by RT PCR: NEGATIVE

## 2020-06-15 LAB — HEMOGLOBIN A1C
Hgb A1c MFr Bld: 5.5 % (ref 4.8–5.6)
Mean Plasma Glucose: 111.15 mg/dL

## 2020-06-15 LAB — TSH: TSH: 2.916 u[IU]/mL (ref 0.350–4.500)

## 2020-06-15 SURGERY — LEFT HEART CATH AND CORONARY ANGIOGRAPHY
Anesthesia: LOCAL

## 2020-06-15 MED ORDER — MAGNESIUM SULFATE IN D5W 1-5 GM/100ML-% IV SOLN: 1.0000 g | Freq: Once | INTRAVENOUS | Status: DC

## 2020-06-15 MED ORDER — ASPIRIN 81 MG PO CHEW: 324.0000 mg | CHEWABLE_TABLET | ORAL | Status: DC

## 2020-06-15 MED ORDER — IOHEXOL 350 MG/ML SOLN
INTRAVENOUS | Status: DC | PRN
  Administered 2020-06-15: 30 mL

## 2020-06-15 MED ORDER — ASPIRIN EC 81 MG PO TBEC
81.0000 mg | DELAYED_RELEASE_TABLET | Freq: Every day | ORAL | Status: DC
  Administered 2020-06-16: 81 mg via ORAL
  Filled 2020-06-15: qty 1

## 2020-06-15 MED ORDER — FENTANYL CITRATE (PF) 100 MCG/2ML IJ SOLN
12.5000 ug | INTRAMUSCULAR | Status: DC | PRN
  Administered 2020-06-15 – 2020-06-16 (×3): 12.5 ug via INTRAVENOUS
  Filled 2020-06-15 (×3): qty 2

## 2020-06-15 MED ORDER — MIDAZOLAM HCL 2 MG/2ML IJ SOLN
INTRAMUSCULAR | Status: AC
  Filled 2020-06-15: qty 2

## 2020-06-15 MED ORDER — ACETAMINOPHEN 325 MG PO TABS: 650.0000 mg | ORAL_TABLET | ORAL | Status: DC | PRN

## 2020-06-15 MED ORDER — HEPARIN (PORCINE) 25000 UT/250ML-% IV SOLN
1000.0000 [IU]/h | INTRAVENOUS | Status: DC
  Administered 2020-06-15: 1000 [IU]/h via INTRAVENOUS
  Filled 2020-06-15: qty 250

## 2020-06-15 MED ORDER — HEPARIN BOLUS VIA INFUSION
4000.0000 [IU] | Freq: Once | INTRAVENOUS | Status: AC
  Administered 2020-06-15: 4000 [IU] via INTRAVENOUS
  Filled 2020-06-15: qty 4000

## 2020-06-15 MED ORDER — LIDOCAINE HCL (PF) 1 % IJ SOLN
INTRAMUSCULAR | Status: AC
  Filled 2020-06-15: qty 30

## 2020-06-15 MED ORDER — NITROGLYCERIN IN D5W 200-5 MCG/ML-% IV SOLN
0.0000 ug/min | INTRAVENOUS | Status: DC
  Administered 2020-06-15: 5 ug/min via INTRAVENOUS
  Filled 2020-06-15: qty 250

## 2020-06-15 MED ORDER — LIDOCAINE HCL (PF) 1 % IJ SOLN
INTRAMUSCULAR | Status: DC | PRN
  Administered 2020-06-15: 2 mL

## 2020-06-15 MED ORDER — ATORVASTATIN CALCIUM 40 MG PO TABS: 40.0000 mg | ORAL_TABLET | Freq: Every day | ORAL | Status: DC

## 2020-06-15 MED ORDER — ONDANSETRON HCL 4 MG/2ML IJ SOLN
4.0000 mg | Freq: Four times a day (QID) | INTRAMUSCULAR | Status: DC | PRN
  Administered 2020-06-15: 4 mg via INTRAVENOUS
  Filled 2020-06-15: qty 2

## 2020-06-15 MED ORDER — VERAPAMIL HCL 2.5 MG/ML IV SOLN
INTRAVENOUS | Status: DC | PRN
  Administered 2020-06-15: 10 mL via INTRA_ARTERIAL

## 2020-06-15 MED ORDER — ONDANSETRON HCL 4 MG/2ML IJ SOLN: 4.0000 mg | Freq: Four times a day (QID) | INTRAMUSCULAR | Status: DC | PRN

## 2020-06-15 MED ORDER — SODIUM CHLORIDE 0.9 % IV SOLN: INTRAVENOUS | Status: AC

## 2020-06-15 MED ORDER — HEPARIN (PORCINE) IN NACL 1000-0.9 UT/500ML-% IV SOLN
INTRAVENOUS | Status: AC
  Filled 2020-06-15: qty 1000

## 2020-06-15 MED ORDER — SODIUM CHLORIDE 0.9 % IV SOLN: 250.0000 mL | INTRAVENOUS | Status: DC | PRN

## 2020-06-15 MED ORDER — MAGNESIUM SULFATE 2 GM/50ML IV SOLN
2.0000 g | Freq: Once | INTRAVENOUS | Status: AC
  Administered 2020-06-15: 2 g via INTRAVENOUS
  Filled 2020-06-15: qty 50

## 2020-06-15 MED ORDER — MIDAZOLAM HCL 2 MG/2ML IJ SOLN
INTRAMUSCULAR | Status: DC | PRN
  Administered 2020-06-15: 1 mg via INTRAVENOUS
  Administered 2020-06-15: 2 mg via INTRAVENOUS

## 2020-06-15 MED ORDER — SODIUM CHLORIDE 0.9% FLUSH: 3.0000 mL | INTRAVENOUS | Status: DC | PRN

## 2020-06-15 MED ORDER — SODIUM CHLORIDE 0.9% FLUSH
3.0000 mL | Freq: Two times a day (BID) | INTRAVENOUS | Status: DC
  Administered 2020-06-15 – 2020-06-16 (×2): 3 mL via INTRAVENOUS

## 2020-06-15 MED ORDER — FENTANYL CITRATE (PF) 100 MCG/2ML IJ SOLN
INTRAMUSCULAR | Status: AC
  Filled 2020-06-15: qty 2

## 2020-06-15 MED ORDER — ASPIRIN 81 MG PO CHEW
81.0000 mg | CHEWABLE_TABLET | ORAL | Status: AC
Start: 2020-06-15 — End: 2020-06-15
  Administered 2020-06-15: 81 mg via ORAL
  Filled 2020-06-15: qty 1

## 2020-06-15 MED ORDER — AMLODIPINE BESYLATE 2.5 MG PO TABS
2.5000 mg | ORAL_TABLET | Freq: Every day | ORAL | Status: DC
  Administered 2020-06-15: 2.5 mg via ORAL
  Filled 2020-06-15: qty 1

## 2020-06-15 MED ORDER — HEPARIN (PORCINE) IN NACL 1000-0.9 UT/500ML-% IV SOLN
INTRAVENOUS | Status: DC | PRN
  Administered 2020-06-15 (×2): 500 mL

## 2020-06-15 MED ORDER — FENTANYL CITRATE (PF) 100 MCG/2ML IJ SOLN
INTRAMUSCULAR | Status: DC | PRN
  Administered 2020-06-15 (×2): 25 ug via INTRAVENOUS

## 2020-06-15 MED ORDER — ASPIRIN 300 MG RE SUPP: 300.0000 mg | RECTAL | Status: DC

## 2020-06-15 MED ORDER — SODIUM CHLORIDE 0.9% FLUSH: 3.0000 mL | Freq: Two times a day (BID) | INTRAVENOUS | Status: DC

## 2020-06-15 MED ORDER — HEPARIN SODIUM (PORCINE) 1000 UNIT/ML IJ SOLN
INTRAMUSCULAR | Status: AC
  Filled 2020-06-15: qty 1

## 2020-06-15 MED ORDER — HEPARIN SODIUM (PORCINE) 1000 UNIT/ML IJ SOLN
INTRAMUSCULAR | Status: DC | PRN
  Administered 2020-06-15: 5000 [IU] via INTRAVENOUS

## 2020-06-15 MED ORDER — HYDRALAZINE HCL 20 MG/ML IJ SOLN: 10.0000 mg | INTRAMUSCULAR | Status: AC | PRN

## 2020-06-15 MED ORDER — LABETALOL HCL 5 MG/ML IV SOLN: 10.0000 mg | INTRAVENOUS | Status: AC | PRN

## 2020-06-15 MED ORDER — SODIUM CHLORIDE 0.9 % WEIGHT BASED INFUSION
3.0000 mL/kg/h | INTRAVENOUS | Status: AC
  Administered 2020-06-15: 3 mL/kg/h via INTRAVENOUS

## 2020-06-15 MED ORDER — SODIUM CHLORIDE 0.9 % WEIGHT BASED INFUSION: 1.0000 mL/kg/h | INTRAVENOUS | Status: DC

## 2020-06-15 MED ORDER — VERAPAMIL HCL 2.5 MG/ML IV SOLN
INTRAVENOUS | Status: AC
  Filled 2020-06-15: qty 2

## 2020-06-15 MED ORDER — POTASSIUM CHLORIDE CRYS ER 20 MEQ PO TBCR
40.0000 meq | EXTENDED_RELEASE_TABLET | Freq: Once | ORAL | Status: AC
  Administered 2020-06-15: 40 meq via ORAL
  Filled 2020-06-15: qty 2

## 2020-06-15 SURGICAL SUPPLY — 10 items
CATH 5FR JL3.5 JR4 ANG PIG MP (CATHETERS) ×2 IMPLANT
DEVICE RAD COMP TR BAND LRG (VASCULAR PRODUCTS) ×2 IMPLANT
GLIDESHEATH SLEND SS 6F .021 (SHEATH) ×2 IMPLANT
GUIDEWIRE INQWIRE 1.5J.035X260 (WIRE) ×1 IMPLANT
INQWIRE 1.5J .035X260CM (WIRE) ×2
KIT HEART LEFT (KITS) ×2 IMPLANT
PACK CARDIAC CATHETERIZATION (CUSTOM PROCEDURE TRAY) ×2 IMPLANT
SHEATH PROBE COVER 6X72 (BAG) ×2 IMPLANT
TRANSDUCER W/STOPCOCK (MISCELLANEOUS) ×2 IMPLANT
TUBING CIL FLEX 10 FLL-RA (TUBING) ×2 IMPLANT

## 2020-06-15 NOTE — Progress Notes (Signed)
  PROGRESS NOTE  Patient admitted earlier this morning. See H&P.   She is a 35 yo female with past medical history significant for gestational HTN, recently gave birth 2 months ago, who presents with several day history of chest pain. Her chest pain got worse overnight which prompted her to come to the ED. Work up revealed elevated troponin up to [place].   Patient seen and examined in the ED. She admits to some chest pain, no SOB. She is about to pump breast milk.   Cardiology planning for heart cath this morning.    Status is: Observation  The patient will require care spanning > 2 midnights and should be moved to inpatient because: Ongoing diagnostic testing needed not appropriate for outpatient work up  Dispo: The patient is from: Home              Anticipated d/c is to: Home              Patient currently is not medically stable to d/c.   Difficult to place patient No       Dessa Phi, DO [facility] 06/15/2020, 8:23 AM  Available via Epic secure chat 7am-7pm After these hours, please refer to coverage provider listed on [email or web address]

## 2020-06-15 NOTE — H&P (Signed)
History and Physical    Anita Potter ZOX:[id number] DOB: February 26, 1986 DOA: 06/14/2020  PCP: Pcp, No  Patient coming from: Home   Chief Complaint:  Chief Complaint  Patient presents with  . Chest Pain     HPI:    35 year old female with past medical history of gestational hypertension (delivered 03/2020) and nicotine dependence who presents to [facility] emergency department via EMS with complaints of chest discomfort.  Patient explains that for the past several days she noticed that she was experiencing episodes of chest discomfort.  Patient states that these episodes of chest discomfort were midsternal in location, mild in intensity and occurring at post rest and with exertion.  Patient does not recall any other details concerning these episodes of chest discomfort as she did not think that they were anything serious.  Patient explains that the evening of 3/28 she was breast-feeding her 21-month-old child when she suddenly began to experience an intense feeling of nausea and lightheadedness.  Patient proceeded to ambulate to the bathroom with the assistance of her husband and it was at that time that she began to experience a sudden onset chest pressure.  Patient describes the chest pressure as severe in intensity, midsternal, pressure-like in quality and nonradiating.  This was associated with severe shortness of breath.  Patient denies any associated diaphoresis or cough.  Upon further questioning patient denies any fevers, paroxysmal nocturnal dyspnea, pillow orthopnea, sick contacts, recent travel, drug use, new medications or herbal supplements or contact with confirmed COVID-19 infection as of late.  EMS was contacted and promptly came to evaluate the patient.  The patient was then brought to [facility] emergency department for evaluation.  In route, the patient received 324 mg of aspirin as well as receiving 2 separate doses of sublingual nitroglycerin with some  improvement in her chest discomfort.  EMS blood pressure was 180/116.  Prominent chest x-ray was performed and was found be unremarkable.  D-dimer was found to be slightly elevated at 0.52 and therefore CT angiogram of the chest performed which revealed no evidence of pulmonary embolism.  Covid testing was performed and was found to be negative.  EKG was performed revealing normal sinus rhythm with Q waves in the anterior septal leads.  Troponin was performed and was found to be surprisingly elevated at 275 and therefore case was discussed with Dr. Paticia Stack with cardiology who agreed to come evaluate the patient in consultation this evening.  Troponin was found to be markedly elevated at 1806 and at that point cardiology recommended initiation of a heparin infusion.  The hospitalist group was then called to assess the patient for admission to the hospital.  Review of Systems:   Review of Systems  Respiratory: Positive for shortness of breath.   Cardiovascular: Positive for chest pain.  All other systems reviewed and are negative.   Past Medical History:  Diagnosis Date  . Chronic hypertension 05/18/2019  . History of gestational hypertension 06/15/2020  . Hx of chlamydia infection 03/2016  . Infection    UTI  . Postpartum hypertension 07/09/2016  . Pregnancy induced hypertension    meds PP for 6-8wks  . Uterine fibroid     Past Surgical History:  Procedure Laterality Date  . NO PAST SURGERIES       reports that she has been smoking cigarettes. She has been smoking about 0.33 packs per day. She has never used smokeless tobacco. She reports previous alcohol use. She reports that she does not use drugs.  No Known Allergies  Family History  Problem Relation Age of Onset  . Hypertension Mother   . Lung cancer Father      Prior to Admission medications   Medication Sig Start Date End Date Taking? Authorizing Provider  acetaminophen (TYLENOL) 325 MG tablet Take 2 tablets (650 mg total)  by mouth every 4 (four) hours as needed (for pain scale < 4). Patient taking differently: Take 650 mg by mouth every 4 (four) hours as needed for mild pain or headache. 11/02/17   Aletha Halim, MD  dicyclomine (BENTYL) 10 MG capsule Take 1 capsule (10 mg total) by mouth 3 (three) times daily as needed for up to 3 days for spasms. 12/02/19 12/05/19  Jorje Guild, NP  hydrOXYzine (ATARAX/VISTARIL) 25 MG tablet Take 25 mg by mouth every 6 (six) hours as needed for itching.    [provider]  ibuprofen (ADVIL) 600 MG tablet Take 1 tablet (600 mg total) by mouth every 6 (six) hours as needed for moderate pain or cramping. 04/11/20   Sherlyn Hay, DO  NIFEdipine (ADALAT CC) 30 MG 24 hr tablet Take 1 tablet (30 mg total) by mouth daily. 04/12/20   Banga, Bonnee Quin, DO  Prenatal Vit-Fe Fumarate-FA (MULTIVITAMIN-PRENATAL) 27-0.8 MG TABS tablet Take 1 tablet by mouth daily at 12 noon.    [provider]    Physical Exam: Vitals:   06/14/20 2230 06/14/20 2245 06/14/20 2300 06/15/20 0215  BP: (!) 139/93 (!) 135/98 (!) 149/98 (!) 143/95  Pulse: (!) 57 (!) 51 (!) 56 (!) 54  Resp: 12 16 15 15   Temp:      SpO2: 98% 100% 99% 96%    Constitutional: Awake alert and oriented x3, no associated distress.   Skin: no rashes, no lesions, good skin turgor noted. Eyes: Pupils are equally reactive to light.  No evidence of scleral icterus or conjunctival pallor.  ENMT: Moist mucous membranes noted.  Posterior pharynx clear of any exudate or lesions.   Neck: normal, supple, no masses, no thyromegaly.  No evidence of jugular venous distension.   Respiratory: clear to auscultation bilaterally, no wheezing, no crackles. Normal respiratory effort. No accessory muscle use.  Cardiovascular: Regular rate and rhythm, no murmurs / rubs / gallops. No extremity edema. 2+ pedal pulses. No carotid bruits.  Chest:   Nontender without crepitus or deformity.   Back:   Nontender without crepitus or  deformity. Abdomen: Abdomen is soft and nontender.  No evidence of intra-abdominal masses.  Positive bowel sounds noted in all quadrants.   Musculoskeletal: No joint deformity upper and lower extremities. Good ROM, no contractures. Normal muscle tone.  Neurologic: CN 2-12 grossly intact. Sensation intact.  Patient moving all 4 extremities spontaneously.  Patient is following all commands.  Patient is responsive to verbal stimuli.   Psychiatric: Patient exhibits normal mood with appropriate affect.  Patient seems to possess insight as to their current situation.     Labs on Admission: I have personally reviewed following labs and imaging studies -   CBC: Recent Labs  Lab 06/14/20 2131  WBC 6.9  NEUTROABS 4.1  HGB 12.7  HCT 36.2  MCV 86.2  PLT 720   Basic Metabolic Panel: Recent Labs  Lab 06/14/20 2131  NA 138  K 3.2*  CL 107  CO2 25  GLUCOSE 93  BUN 8  CREATININE 0.71  CALCIUM 8.5*   GFR: CrCl cannot be calculated (Unknown ideal weight.). Liver Function Tests: Recent Labs  Lab 06/14/20 2131  AST 18  ALT 17  ALKPHOS 94  BILITOT 0.4  PROT 6.4*  ALBUMIN 3.5   Recent Labs  Lab 06/14/20 2131  LIPASE 28   No results for input(s): AMMONIA in the last 168 hours. Coagulation Profile: No results for input(s): INR, PROTIME in the last 168 hours. Cardiac Enzymes: No results for input(s): CKTOTAL, CKMB, CKMBINDEX, TROPONINI in the last 168 hours. BNP (last 3 results) No results for input(s): PROBNP in the last 8760 hours. HbA1C: No results for input(s): HGBA1C in the last 72 hours. CBG: No results for input(s): GLUCAP in the last 168 hours. Lipid Profile: No results for input(s): CHOL, HDL, LDLCALC, TRIG, CHOLHDL, LDLDIRECT in the last 72 hours. Thyroid Function Tests: No results for input(s): TSH, T4TOTAL, FREET4, T3FREE, THYROIDAB in the last 72 hours. Anemia Panel: No results for input(s): VITAMINB12, FOLATE, FERRITIN, TIBC, IRON, RETICCTPCT in the last 72  hours. Urine analysis:    Component Value Date/Time   COLORURINE YELLOW 02/21/2020 1026   APPEARANCEUR HAZY (A) 02/21/2020 1026   LABSPEC 1.025 02/21/2020 1026   PHURINE 5.0 02/21/2020 1026   GLUCOSEU NEGATIVE 02/21/2020 1026   HGBUR SMALL (A) 02/21/2020 1026   BILIRUBINUR NEGATIVE 02/21/2020 1026   KETONESUR 20 (A) 02/21/2020 1026   PROTEINUR 30 (A) 02/21/2020 1026   UROBILINOGEN 0.2 10/30/2017 1000   NITRITE NEGATIVE 02/21/2020 1026   LEUKOCYTESUR NEGATIVE 02/21/2020 1026    Radiological Exams on Admission - Personally Reviewed: CT Angio Chest PE W and/or Wo Contrast  Result Date: 06/14/2020 CLINICAL DATA:  Nausea and chest pain after nursing sign EXAM: CT ANGIOGRAPHY CHEST WITH CONTRAST TECHNIQUE: Multidetector CT imaging of the chest was performed using the standard protocol during bolus administration of intravenous contrast. Multiplanar CT image reconstructions and MIPs were obtained to evaluate the vascular anatomy. CONTRAST:  75 mL Omnipaque 350 IV COMPARISON:  Radiograph 06/14/2020 FINDINGS: Cardiovascular: Satisfactory opacification the pulmonary arteries to the segmental level. No pulmonary artery filling defects are identified. Central pulmonary arteries are normal caliber. Normal heart size. No sizable pericardial effusion with a trace amount of fluid in the pericardial recesses within physiologic normal. Suboptimal opacification of the thoracic aorta for luminal assessment. No gross abnormality of the normal caliber thoracic aorta and normally branching proximal great vessels. Slight reflux of contrast into the IVC and hepatic veins. No other major venous abnormality. Mediastinum/Nodes: No mediastinal fluid or gas. Normal thyroid gland and thoracic inlet. No acute abnormality of the trachea or esophagus. No worrisome mediastinal, hilar or axillary adenopathy. Lungs/Pleura: No consolidation, features of edema, pneumothorax, or effusion. No suspicious pulmonary nodules or masses.  Upper Abdomen: No acute abnormalities present in the visualized portions of the upper abdomen. Musculoskeletal: No acute osseous abnormality or suspicious osseous lesion. No worrisome chest wall masses or lesions. Prominence of the glandular tissue in the breast compatible with lactation related changes. Review of the MIP images confirms the above findings. IMPRESSION: 1. No evidence of pulmonary embolism. 2. Slight reflux of contrast into the IVC and hepatic veins. Finding is nonspecific though can be seen with elevated right heart pressures. 3. No other acute intrathoracic process. Electronically Signed   By: Lovena Le M.D.   On: 06/14/2020 23:29   DG Chest Portable 1 View  Result Date: 06/14/2020 CLINICAL DATA:  Chest pain. EXAM: PORTABLE CHEST 1 VIEW COMPARISON:  Radiograph 06/07/2007 FINDINGS: The cardiomediastinal contours are normal. The lungs are clear. Pulmonary vasculature is normal. No consolidation, pleural effusion, or pneumothorax. No acute osseous abnormalities are seen.  IMPRESSION: Negative AP view of the chest. Electronically Signed   By: Keith Rake M.D.   On: 06/14/2020 21:48    EKG: Personally reviewed.  Rhythm is normal sinus rhythm with heart rate of 61 bpm.  Q waves noted in the anterior septal leads.  No dynamic ST segment changes appreciated.  Assessment/Plan Principal Problem:   NSTEMI (non-ST elevated myocardial infarction) [facility])   Patient presenting with a severe episode of chest discomfort with mostly typical features.  EKG reveals anterior septal Q waves but no evidence of STEMI.  No evidence of arrhythmias.  Initial work-up revealing markedly elevated troponins of 275 followed by 1806 concerning for NSTEMI.  Patient has been initiated on a heparin infusion at the direction of cardiology.  Patient is already received a dose of 324 mg of aspirin in route via EMS and will but they placed on 81 mg of aspirin by mouth going forward.  Patient has been kept  n.p.o. for repeat cardiology evaluation later this morning for possible cardiac intervention/ischemic work-up.  Patient is still experiencing ongoing mild chest discomfort and therefore patient will be placed on a nitroglycerin infusion which will be titrated until chest pain resolution  Continuing to cycle cardiac enzymes  Continuing to monitor patient on telemetry  Placing patient in progressive unit to continue to monitor for hemodynamic instability.  Obtaining lipid panel, hemoglobin A1c.  Active Problems:   Essential hypertension   Patient was diagnosed with gestational hypertension during her last pregnancy, delivering in January.   Patient also admits to having stopped her nifedipine 1 to 2 weeks ago without being directed to by her provider.  Patient continues to be hypertensive here throughout her emergency department stay.  Patient no longer has gestational hypertension she now has chronic/essential hypertension.  Currently on nitroglycerin infusion but at time of discharge patient may need to go home on a daily oral antihypertensive.    Nicotine dependence, cigarettes, uncomplicated   Counseling patient on cessation   Code Status:  Full code Family Communication: deferred   Status is: Observation  The patient remains OBS appropriate and will d/c before 2 midnights.  Dispo: The patient is from: Home              Anticipated d/c is to: Home              Patient currently is not medically stable to d/c.   Difficult to place patient No        Vernelle Emerald MD [facility] Pager [phone]  If 7PM-7AM, please contact night-coverage [email or web address] Use universal [place] password for that web site. If you do not have the password, please call the hospital operator.  06/15/2020, 4:27 AM

## 2020-06-15 NOTE — H&P (View-Only) (Signed)
Cardiology Consultation:   Patient ID: Anita Potter MRN: [id number]; DOB: Aug 22, 1985  Admit date: 06/14/2020 Date of Consult: 06/15/2020  PCP:  Pcp, No   [place]  Cardiologist:  No primary care provider on file.  Advanced Practice Provider:  No care team member to display Electrophysiologist:  None    Chief Complaint: Chest pain  History of Present Illness:   Anita Potter is a 35 year old female with a past medical history of chronic hypertension, uterine fibroids and now 2 months postpartum, presents with complaints of chest pain. She described it as central chest pain, with a "heavy" feeling, associated with nausea. She denied any radiation of the chest pain. No vomiting or diaphoresis. EMS recorded a BP of 180/116 mmHg. She felt better after receiving 2 SL NTG and ASA.  She denied any orthopnea, PND or leg swelling.  The high-sensitivity troponins were 275 and 1806. D-Dimer was 0.52. CXR was normal. CT angio was negative for a PE.  The ECG (from 06/14/2020, 21:27) showed NSR, rate 61 bpm, q waves in V1 and V2, and T wave flattening in the lateral leads.    Past Medical History:  Diagnosis Date  . Chronic hypertension 05/18/2019  . Hx of chlamydia infection 03/2016  . Infection    UTI  . Postpartum hypertension 07/09/2016  . Pregnancy induced hypertension    meds PP for 6-8wks  . Uterine fibroid     Past Surgical History:  Procedure Laterality Date  . NO PAST SURGERIES       Home Medications:  Prior to Admission medications   Medication Sig Start Date End Date Taking? Authorizing Provider  acetaminophen (TYLENOL) 325 MG tablet Take 2 tablets (650 mg total) by mouth every 4 (four) hours as needed (for pain scale < 4). Patient taking differently: Take 650 mg by mouth every 4 (four) hours as needed for mild pain or headache. 11/02/17   Aletha Halim, MD  dicyclomine (BENTYL) 10 MG capsule Take 1 capsule (10 mg total) by mouth 3 (three)  times daily as needed for up to 3 days for spasms. 12/02/19 12/05/19  Jorje Guild, NP  hydrOXYzine (ATARAX/VISTARIL) 25 MG tablet Take 25 mg by mouth every 6 (six) hours as needed for itching.    [provider]  ibuprofen (ADVIL) 600 MG tablet Take 1 tablet (600 mg total) by mouth every 6 (six) hours as needed for moderate pain or cramping. 04/11/20   Sherlyn Hay, DO  NIFEdipine (ADALAT CC) 30 MG 24 hr tablet Take 1 tablet (30 mg total) by mouth daily. 04/12/20   Banga, Bonnee Quin, DO  Prenatal Vit-Fe Fumarate-FA (MULTIVITAMIN-PRENATAL) 27-0.8 MG TABS tablet Take 1 tablet by mouth daily at 12 noon.    [provider]    Inpatient Medications: Scheduled Meds: . fentaNYL (SUBLIMAZE) injection  25 mcg Intravenous Once   Continuous Infusions:  PRN Meds:   Allergies:   No Known Allergies  Social History:   Social History   Socioeconomic History  . Marital status: Married    Spouse name: Not on file  . Number of children: 1  . Years of education: Not on file  . Highest education level: Bachelor's degree (e.g., BA, AB, BS)  Occupational History  . Not on file  Tobacco Use  . Smoking status: Current Some Day Smoker    Packs/day: 0.50    Types: Cigarettes  . Smokeless tobacco: Never Used  Vaping Use  . Vaping Use: Never used  Substance and Sexual Activity  . Alcohol use: Not Currently    Comment: none since pregnancy  . Drug use: No  . Sexual activity: Yes    Birth control/protection: None  Other Topics Concern  . Not on file  Social History Narrative  . Not on file   Social Determinants of Health   Financial Resource Strain: Not on file  Food Insecurity: Not on file  Transportation Needs: Not on file  Physical Activity: Not on file  Stress: Not on file  Social Connections: Not on file  Intimate Partner Violence: Not on file    Family History:    Family History  Problem Relation Age of Onset  . Hypertension Mother   . Cancer Father         lung     ROS:  Please see the history of present illness.  All other ROS reviewed and negative.     Physical Exam/Data:   Vitals:   06/14/20 2128 06/14/20 2230 06/14/20 2245 06/14/20 2300  BP: (!) 149/97 (!) 139/93 (!) 135/98 (!) 149/98  Pulse: 67 (!) 57 (!) 51 (!) 56  Resp: 16 12 16 15   Temp: 98.3 F (36.8 C)     SpO2: 99% 98% 100% 99%    Intake/Output Summary (Last 24 hours) at 06/15/2020 0014 Last data filed at 06/14/2020 2309 Gross per 24 hour  Intake 1000 ml  Output --  Net 1000 ml   Last 3 Weights 04/09/2020 04/07/2020 04/01/2020  Weight (lbs) 239 lb 6.4 oz 238 lb 6.4 oz 236 lb  Weight (kg) 108.591 kg 108.138 kg 107.049 kg     There is no height or weight on file to calculate BMI.  General:  Well nourished, well developed, in no acute distress HEENT: normal Lymph: no adenopathy Neck: no JVD Endocrine:  No thryomegaly Vascular: No carotid bruits; FA pulses 2+ bilaterally without bruits  Cardiac:  normal S1, S2; RRR; no murmur  Lungs:  clear to auscultation bilaterally, no wheezing, rhonchi or rales  Abd: soft, nontender, no hepatomegaly  Ext: no edema Musculoskeletal:  No deformities, BUE and BLE strength normal and equal Skin: warm and dry  Neuro:  CNs 2-12 intact, no focal abnormalities noted Psych:  Normal affect   EKG:  (from 06/14/2020, 21:27) showed NSR, rate 61 bpm, q waves in V1 and V2, and T wave flattening in the lateral leads.    Laboratory Data:  High Sensitivity Troponin:   Recent Labs  Lab 06/14/20 2131  TROPONINIHS 275*     Chemistry Recent Labs  Lab 06/14/20 2131  NA 138  K 3.2*  CL 107  CO2 25  GLUCOSE 93  BUN 8  CREATININE 0.71  CALCIUM 8.5*  GFRNONAA >60  ANIONGAP 6    Recent Labs  Lab 06/14/20 2131  PROT 6.4*  ALBUMIN 3.5  AST 18  ALT 17  ALKPHOS 94  BILITOT 0.4   Hematology Recent Labs  Lab 06/14/20 2131  WBC 6.9  RBC 4.20  HGB 12.7  HCT 36.2  MCV 86.2  MCH 30.2  MCHC 35.1  RDW 12.2  PLT 213    BNP Recent Labs  Lab 06/14/20 2216  BNP 29.2    DDimer  Recent Labs  Lab 06/14/20 2131  DDIMER 0.52*     Radiology/Studies:  CT Angio Chest PE W and/or Wo Contrast  Result Date: 06/14/2020 CLINICAL DATA:  Nausea and chest pain after nursing sign EXAM: CT ANGIOGRAPHY CHEST WITH CONTRAST TECHNIQUE: Multidetector CT imaging of  the chest was performed using the standard protocol during bolus administration of intravenous contrast. Multiplanar CT image reconstructions and MIPs were obtained to evaluate the vascular anatomy. CONTRAST:  75 mL Omnipaque 350 IV COMPARISON:  Radiograph 06/14/2020 FINDINGS: Cardiovascular: Satisfactory opacification the pulmonary arteries to the segmental level. No pulmonary artery filling defects are identified. Central pulmonary arteries are normal caliber. Normal heart size. No sizable pericardial effusion with a trace amount of fluid in the pericardial recesses within physiologic normal. Suboptimal opacification of the thoracic aorta for luminal assessment. No gross abnormality of the normal caliber thoracic aorta and normally branching proximal great vessels. Slight reflux of contrast into the IVC and hepatic veins. No other major venous abnormality. Mediastinum/Nodes: No mediastinal fluid or gas. Normal thyroid gland and thoracic inlet. No acute abnormality of the trachea or esophagus. No worrisome mediastinal, hilar or axillary adenopathy. Lungs/Pleura: No consolidation, features of edema, pneumothorax, or effusion. No suspicious pulmonary nodules or masses. Upper Abdomen: No acute abnormalities present in the visualized portions of the upper abdomen. Musculoskeletal: No acute osseous abnormality or suspicious osseous lesion. No worrisome chest wall masses or lesions. Prominence of the glandular tissue in the breast compatible with lactation related changes. Review of the MIP images confirms the above findings. IMPRESSION: 1. No evidence of pulmonary embolism. 2.  Slight reflux of contrast into the IVC and hepatic veins. Finding is nonspecific though can be seen with elevated right heart pressures. 3. No other acute intrathoracic process. Electronically Signed   By: Lovena Le M.D.   On: 06/14/2020 23:29   DG Chest Portable 1 View  Result Date: 06/14/2020 CLINICAL DATA:  Chest pain. EXAM: PORTABLE CHEST 1 VIEW COMPARISON:  Radiograph 06/07/2007 FINDINGS: The cardiomediastinal contours are normal. The lungs are clear. Pulmonary vasculature is normal. No consolidation, pleural effusion, or pneumothorax. No acute osseous abnormalities are seen. IMPRESSION: Negative AP view of the chest. Electronically Signed   By: Keith Rake M.D.   On: 06/14/2020 21:48     Assessment and Plan:   1.  Chest pain & elevated troponin   The patient presents with central chest pain, an abnormal ECG and positively trending high-sensitivity troponins. Possible etiologies for her NSTEMI include hypertensive emergency versus coronary vasospasm versus SCAD (spontaneous coronary artery dissection).  -Serial ECGs -Continue to trend cardiac enzymes -ASA 81 mg daily -NTG prn for chest pain -Unfractionated heparin IV infusion -Echo to assess LV function -Keep NPO for possible ischemia work-up in the morning (cath vs coronary CT)   2.  Hypertension  -Low sodium diet -Continue nifedipine   For questions or updates, please contact [place] Please consult [email or web address] for contact info under    Signed, Meade Maw, MD  06/15/2020 12:14 AM

## 2020-06-15 NOTE — Interval H&P Note (Signed)
Cath Lab Visit (complete for each Cath Lab visit)  Clinical Evaluation Leading to the Procedure:   ACS: Yes.    Non-ACS:    Anginal Classification: CCS IV  Anti-ischemic medical therapy: Minimal Therapy (1 class of medications)  Non-Invasive Test Results: No non-invasive testing performed  Prior CABG: No previous CABG      History and Physical Interval Note:  06/15/2020 9:48 AM  Anita Potter  has presented today for surgery, with the diagnosis of chest pain.  The various methods of treatment have been discussed with the patient and family. After consideration of risks, benefits and other options for treatment, the patient has consented to  Procedure(s): LEFT HEART CATH AND CORONARY ANGIOGRAPHY (N/A) as a surgical intervention.  The patient's history has been reviewed, patient examined, no change in status, stable for surgery.  I have reviewed the patient's chart and labs.  Questions were answered to the patient's satisfaction.     Larae Grooms

## 2020-06-15 NOTE — Progress Notes (Addendum)
Progress Note  Patient Name: Anita Potter Date of Encounter: 06/15/2020  Primary Cardiologist: New  Subjective   No further chest pain. No SOB or acute complaints other than feeling tired. She got report from family that her baby was an angel overnight. She is breastfeeding.  Inpatient Medications    Scheduled Meds: . [START ON 06/16/2020] aspirin EC  81 mg Oral Daily  . fentaNYL (SUBLIMAZE) injection  25 mcg Intravenous Once   Continuous Infusions: . heparin 1,000 Units/hr (06/15/20 0244)  . nitroGLYCERIN 15 mcg/min (06/15/20 0710)   PRN Meds: acetaminophen, ondansetron (ZOFRAN) IV   Vital Signs    Vitals:   06/15/20 0545 06/15/20 0600 06/15/20 0630 06/15/20 0700  BP: (!) 158/98 (!) 131/97 (!) 145/108 (!) 148/102  Pulse: 62 (!) 27 68 60  Resp: 14 13 15 14   Temp:      SpO2: 96% 97% 97% 97%    Intake/Output Summary (Last 24 hours) at 06/15/2020 0755 Last data filed at 06/14/2020 2309 Gross per 24 hour  Intake 1000 ml  Output --  Net 1000 ml   Last 3 Weights 04/09/2020 04/07/2020 04/01/2020  Weight (lbs) 239 lb 6.4 oz 238 lb 6.4 oz 236 lb  Weight (kg) 108.591 kg 108.138 kg 107.049 kg     Telemetry    NSR, occasional PVCs, occasional bigeminy/trigeminy, brief run AIVR 5 beats, otherwise SB upper 40s-60s - Personally Reviewed  Physical Exam   GEN: No acute distress.  HEENT: Normocephalic, atraumatic, sclera non-icteric. Neck: No JVD or bruits. Cardiac: RRR no murmurs, rubs, or gallops.  Respiratory: Clear to auscultation bilaterally. Breathing is unlabored. GI: Soft, nontender, non-distended, BS +x 4. MS: no deformity. Extremities: No clubbing or cyanosis. No edema. Distal pedal pulses are 2+ and equal bilaterally. Neuro:  AAOx3. Follows commands. Psych:  Responds to questions appropriately with a normal affect.  Labs    High Sensitivity Troponin:   Recent Labs  Lab 06/14/20 2131 06/14/20 2331 06/15/20 0442  TROPONINIHS 275* 1,806* 14,231*       Cardiac EnzymesNo results for input(s): TROPONINI in the last 168 hours. No results for input(s): TROPIPOC in the last 168 hours.   Chemistry Recent Labs  Lab 06/14/20 2131 06/15/20 0442  NA 138 138  K 3.2* 3.6  CL 107 106  CO2 25 25  GLUCOSE 93 80  BUN 8 <5*  CREATININE 0.71 0.57  CALCIUM 8.5* 8.4*  PROT 6.4* 6.7  ALBUMIN 3.5 3.6  AST 18 57*  ALT 17 27  ALKPHOS 94 97  BILITOT 0.4 0.7  GFRNONAA >60 >60  ANIONGAP 6 7     Hematology Recent Labs  Lab 06/14/20 2131  WBC 6.9  RBC 4.20  HGB 12.7  HCT 36.2  MCV 86.2  MCH 30.2  MCHC 35.1  RDW 12.2  PLT 213    BNP Recent Labs  Lab 06/14/20 2216  BNP 29.2     DDimer  Recent Labs  Lab 06/14/20 2131  DDIMER 0.52*     Radiology    CT Angio Chest PE W and/or Wo Contrast  Result Date: 06/14/2020 CLINICAL DATA:  Nausea and chest pain after nursing sign EXAM: CT ANGIOGRAPHY CHEST WITH CONTRAST TECHNIQUE: Multidetector CT imaging of the chest was performed using the standard protocol during bolus administration of intravenous contrast. Multiplanar CT image reconstructions and MIPs were obtained to evaluate the vascular anatomy. CONTRAST:  75 mL Omnipaque 350 IV COMPARISON:  Radiograph 06/14/2020 FINDINGS: Cardiovascular: Satisfactory opacification the pulmonary arteries to the segmental  level. No pulmonary artery filling defects are identified. Central pulmonary arteries are normal caliber. Normal heart size. No sizable pericardial effusion with a trace amount of fluid in the pericardial recesses within physiologic normal. Suboptimal opacification of the thoracic aorta for luminal assessment. No gross abnormality of the normal caliber thoracic aorta and normally branching proximal great vessels. Slight reflux of contrast into the IVC and hepatic veins. No other major venous abnormality. Mediastinum/Nodes: No mediastinal fluid or gas. Normal thyroid gland and thoracic inlet. No acute abnormality of the trachea or esophagus.  No worrisome mediastinal, hilar or axillary adenopathy. Lungs/Pleura: No consolidation, features of edema, pneumothorax, or effusion. No suspicious pulmonary nodules or masses. Upper Abdomen: No acute abnormalities present in the visualized portions of the upper abdomen. Musculoskeletal: No acute osseous abnormality or suspicious osseous lesion. No worrisome chest wall masses or lesions. Prominence of the glandular tissue in the breast compatible with lactation related changes. Review of the MIP images confirms the above findings. IMPRESSION: 1. No evidence of pulmonary embolism. 2. Slight reflux of contrast into the IVC and hepatic veins. Finding is nonspecific though can be seen with elevated right heart pressures. 3. No other acute intrathoracic process. Electronically Signed   By: Lovena Le M.D.   On: 06/14/2020 23:29   DG Chest Portable 1 View  Result Date: 06/14/2020 CLINICAL DATA:  Chest pain. EXAM: PORTABLE CHEST 1 VIEW COMPARISON:  Radiograph 06/07/2007 FINDINGS: The cardiomediastinal contours are normal. The lungs are clear. Pulmonary vasculature is normal. No consolidation, pleural effusion, or pneumothorax. No acute osseous abnormalities are seen. IMPRESSION: Negative AP view of the chest. Electronically Signed   By: Keith Rake M.D.   On: 06/14/2020 21:48    Cardiac Studies   Pending  Patient Profile     35 y.o. female with history of chronic hypertension, uterine fibroids, and now 2 months postpartum (baby boy born 04/[birth year]/20) presented with chest pain and NSTEMI, troponin rose to 14k.  Assessment & Plan    1. NSTEMI - clinical scenario concerning for SCAD - CTA negative for PE - received 324mg  ASA by EMS, continue ASA 81mg  daily (reviewed dosing timeline with MD, OK to give this AM), IV NTG, IV heparin - plan cath today (reviewed with Dr. Meda Coffee) - Risks and benefits of cardiac catheterization have been discussed with the patient.  These include bleeding, infection, kidney  damage, stroke, heart attack, death.  The patient understands these risks and is willing to proceed. - await cath results before starting statin, need to also determine post-cath plans for breastfeeding - hold off BB given baseline sinus bradycardia - await echo  2. Accelerated HTN on presentation - was supposed to be on nifedipine as OP, but not recently taking - most recent BP 132/97 at bedside on IV NTG - TSH wnl - manage in context of #1, plans TBD based on cath  3. Post-partum status - will need to keep in mind patient has been breastfeeding, plans may   4. PVCs/brief AIVR - give KCl 42meq now for mild hypokalemia (last K 3.6) - add Mg level - hold off BB given baseline sinus bradycardia  For questions or updates, please contact [facility] Please consult [email or web address] for contact info under Cardiology/STEMI.  Signed, Charlie Pitter, PA-C 06/15/2020, 7:55 AM    The patient was seen, examined and discussed with Melina Copa, PA-C and I agree with the above.   35 year old female, 2 months postpartum who presented with acute onset chest pain and her troponin raised  up to 14,000, she underwent cardiac catheterization that confirmed suspicion for spontaneous coronary artery dissection in a small branch, the patient was hypertensive on admission, however initiate amlodipine 2.5 mg daily and uptitrate as needed.  We will slowly wean down nitroglycerin infusion. We will discontinue Heparin drip because of concern of potential intramural hematoma in her coronary arteries, we will start patient on long-term aspirin 81 mg daily.  Echocardiogram to evaluate LVEF and wall motion abnormalities is pending.  Ena Dawley, MD 06/15/2020

## 2020-06-15 NOTE — ED Notes (Signed)
MD made aware of pt troponin going from 1806 to 14231 in 5 hours

## 2020-06-15 NOTE — ED Notes (Signed)
MAU called for a breast pump.

## 2020-06-15 NOTE — Consult Note (Signed)
Cardiology Consultation:   Patient ID: Anita Potter MRN: [id number]; DOB: Aug 12, 1985  Admit date: 06/14/2020 Date of Consult: 06/15/2020  PCP:  Pcp, No   Anita Potter  Cardiologist:  No primary care provider on file.  Advanced Practice Provider:  No care team member to display Electrophysiologist:  None    Chief Complaint: Chest pain  History of Present Illness:   Anita Potter is a 35 year old female with a past medical history of chronic hypertension, uterine fibroids and now 2 months postpartum, presents with complaints of chest pain. She described it as central chest pain, with a "heavy" feeling, associated with nausea. She denied any radiation of the chest pain. No vomiting or diaphoresis. EMS recorded a BP of 180/116 mmHg. She felt better after receiving 2 SL NTG and ASA.  She denied any orthopnea, PND or leg swelling.  The high-sensitivity troponins were 275 and 1806. D-Dimer was 0.52. CXR was normal. CT angio was negative for a PE.  The ECG (from 06/14/2020, 21:27) showed NSR, rate 61 bpm, q waves in V1 and V2, and T wave flattening in the lateral leads.    Past Medical History:  Diagnosis Date  . Chronic hypertension 05/18/2019  . Hx of chlamydia infection 03/2016  . Infection    UTI  . Postpartum hypertension 07/09/2016  . Pregnancy induced hypertension    meds PP for 6-8wks  . Uterine fibroid     Past Surgical History:  Procedure Laterality Date  . NO PAST SURGERIES       Home Medications:  Prior to Admission medications   Medication Sig Start Date End Date Taking? Authorizing Provider  acetaminophen (TYLENOL) 325 MG tablet Take 2 tablets (650 mg total) by mouth every 4 (four) hours as needed (for pain scale < 4). Patient taking differently: Take 650 mg by mouth every 4 (four) hours as needed for mild pain or headache. 11/02/17   Aletha Halim, MD  dicyclomine (BENTYL) 10 MG capsule Take 1 capsule (10 mg total) by mouth 3 (three)  times daily as needed for up to 3 days for spasms. 12/02/19 12/05/19  Jorje Guild, NP  hydrOXYzine (ATARAX/VISTARIL) 25 MG tablet Take 25 mg by mouth every 6 (six) hours as needed for itching.    [provider]  ibuprofen (ADVIL) 600 MG tablet Take 1 tablet (600 mg total) by mouth every 6 (six) hours as needed for moderate pain or cramping. 04/11/20   Sherlyn Hay, DO  NIFEdipine (ADALAT CC) 30 MG 24 hr tablet Take 1 tablet (30 mg total) by mouth daily. 04/12/20   Banga, Bonnee Quin, DO  Prenatal Vit-Fe Fumarate-FA (MULTIVITAMIN-PRENATAL) 27-0.8 MG TABS tablet Take 1 tablet by mouth daily at 12 noon.    [provider]    Inpatient Medications: Scheduled Meds: . fentaNYL (SUBLIMAZE) injection  25 mcg Intravenous Once   Continuous Infusions:  PRN Meds:   Allergies:   No Known Allergies  Social History:   Social History   Socioeconomic History  . Marital status: Married    Spouse name: Not on file  . Number of children: 1  . Years of education: Not on file  . Highest education level: Bachelor's degree (e.g., BA, AB, BS)  Occupational History  . Not on file  Tobacco Use  . Smoking status: Current Some Day Smoker    Packs/day: 0.50    Types: Cigarettes  . Smokeless tobacco: Never Used  Vaping Use  . Vaping Use: Never used  Substance and Sexual Activity  . Alcohol use: Not Currently    Comment: none since pregnancy  . Drug use: No  . Sexual activity: Yes    Birth control/protection: None  Other Topics Concern  . Not on file  Social History Narrative  . Not on file   Social Determinants of Health   Financial Resource Strain: Not on file  Food Insecurity: Not on file  Transportation Needs: Not on file  Physical Activity: Not on file  Stress: Not on file  Social Connections: Not on file  Intimate Partner Violence: Not on file    Family History:    Family History  Problem Relation Age of Onset  . Hypertension Mother   . Cancer Father         lung     ROS:  Please see the history of present illness.  All other ROS reviewed and negative.     Physical Exam/Data:   Vitals:   06/14/20 2128 06/14/20 2230 06/14/20 2245 06/14/20 2300  BP: (!) 149/97 (!) 139/93 (!) 135/98 (!) 149/98  Pulse: 67 (!) 57 (!) 51 (!) 56  Resp: 16 12 16 15   Temp: 98.3 F (36.8 C)     SpO2: 99% 98% 100% 99%    Intake/Output Summary (Last 24 hours) at 06/15/2020 0014 Last data filed at 06/14/2020 2309 Gross per 24 hour  Intake 1000 ml  Output --  Net 1000 ml   Last 3 Weights 04/09/2020 04/07/2020 04/01/2020  Weight (lbs) 239 lb 6.4 oz 238 lb 6.4 oz 236 lb  Weight (kg) 108.591 kg 108.138 kg 107.049 kg     There is no height or weight on file to calculate BMI.  General:  Well nourished, well developed, in no acute distress HEENT: normal Lymph: no adenopathy Neck: no JVD Endocrine:  No thryomegaly Vascular: No carotid bruits; FA pulses 2+ bilaterally without bruits  Cardiac:  normal S1, S2; RRR; no murmur  Lungs:  clear to auscultation bilaterally, no wheezing, rhonchi or rales  Abd: soft, nontender, no hepatomegaly  Ext: no edema Musculoskeletal:  No deformities, BUE and BLE strength normal and equal Skin: warm and dry  Neuro:  CNs 2-12 intact, no focal abnormalities noted Psych:  Normal affect   EKG:  (from 06/14/2020, 21:27) showed NSR, rate 61 bpm, q waves in V1 and V2, and T wave flattening in the lateral leads.    Laboratory Data:  High Sensitivity Troponin:   Recent Labs  Lab 06/14/20 2131  TROPONINIHS 275*     Chemistry Recent Labs  Lab 06/14/20 2131  NA 138  K 3.2*  CL 107  CO2 25  GLUCOSE 93  BUN 8  CREATININE 0.71  CALCIUM 8.5*  GFRNONAA >60  ANIONGAP 6    Recent Labs  Lab 06/14/20 2131  PROT 6.4*  ALBUMIN 3.5  AST 18  ALT 17  ALKPHOS 94  BILITOT 0.4   Hematology Recent Labs  Lab 06/14/20 2131  WBC 6.9  RBC 4.20  HGB 12.7  HCT 36.2  MCV 86.2  MCH 30.2  MCHC 35.1  RDW 12.2  PLT 213    BNP Recent Labs  Lab 06/14/20 2216  BNP 29.2    DDimer  Recent Labs  Lab 06/14/20 2131  DDIMER 0.52*     Radiology/Studies:  CT Angio Chest PE W and/or Wo Contrast  Result Date: 06/14/2020 CLINICAL DATA:  Nausea and chest pain after nursing sign EXAM: CT ANGIOGRAPHY CHEST WITH CONTRAST TECHNIQUE: Multidetector CT imaging of  the chest was performed using the standard protocol during bolus administration of intravenous contrast. Multiplanar CT image reconstructions and MIPs were obtained to evaluate the vascular anatomy. CONTRAST:  75 mL Omnipaque 350 IV COMPARISON:  Radiograph 06/14/2020 FINDINGS: Cardiovascular: Satisfactory opacification the pulmonary arteries to the segmental level. No pulmonary artery filling defects are identified. Central pulmonary arteries are normal caliber. Normal heart size. No sizable pericardial effusion with a trace amount of fluid in the pericardial recesses within physiologic normal. Suboptimal opacification of the thoracic aorta for luminal assessment. No gross abnormality of the normal caliber thoracic aorta and normally branching proximal great vessels. Slight reflux of contrast into the IVC and hepatic veins. No other major venous abnormality. Mediastinum/Nodes: No mediastinal fluid or gas. Normal thyroid gland and thoracic inlet. No acute abnormality of the trachea or esophagus. No worrisome mediastinal, hilar or axillary adenopathy. Lungs/Pleura: No consolidation, features of edema, pneumothorax, or effusion. No suspicious pulmonary nodules or masses. Upper Abdomen: No acute abnormalities present in the visualized portions of the upper abdomen. Musculoskeletal: No acute osseous abnormality or suspicious osseous lesion. No worrisome chest wall masses or lesions. Prominence of the glandular tissue in the breast compatible with lactation related changes. Review of the MIP images confirms the above findings. IMPRESSION: 1. No evidence of pulmonary embolism. 2.  Slight reflux of contrast into the IVC and hepatic veins. Finding is nonspecific though can be seen with elevated right heart pressures. 3. No other acute intrathoracic process. Electronically Signed   By: Lovena Le M.D.   On: 06/14/2020 23:29   DG Chest Portable 1 View  Result Date: 06/14/2020 CLINICAL DATA:  Chest pain. EXAM: PORTABLE CHEST 1 VIEW COMPARISON:  Radiograph 06/07/2007 FINDINGS: The cardiomediastinal contours are normal. The lungs are clear. Pulmonary vasculature is normal. No consolidation, pleural effusion, or pneumothorax. No acute osseous abnormalities are seen. IMPRESSION: Negative AP view of the chest. Electronically Signed   By: Keith Rake M.D.   On: 06/14/2020 21:48     Assessment and Plan:   1.  Chest pain & elevated troponin   The patient presents with central chest pain, an abnormal ECG and positively trending high-sensitivity troponins. Possible etiologies for her NSTEMI include hypertensive emergency versus coronary vasospasm versus SCAD (spontaneous coronary artery dissection).  -Serial ECGs -Continue to trend cardiac enzymes -ASA 81 mg daily -NTG prn for chest pain -Unfractionated heparin IV infusion -Echo to assess LV function -Keep NPO for possible ischemia work-up in the morning (cath vs coronary CT)   2.  Hypertension  -Low sodium diet -Continue nifedipine   For questions or updates, please contact [place] Please consult [email or web address] for contact info under    Signed, Meade Maw, MD  06/15/2020 12:14 AM

## 2020-06-15 NOTE — Progress Notes (Signed)
ANTICOAGULATION CONSULT NOTE - Initial Consult  Pharmacy Consult for Heparin  Indication: chest pain/ACS  No Known Allergies  Vital Signs: Temp: 98.3 F (36.8 C) (03/28 2128) BP: 149/98 (03/28 2300) Pulse Rate: 56 (03/28 2300)  Labs: Recent Labs    06/14/20 2131 06/14/20 2331  HGB 12.7  --   HCT 36.2  --   PLT 213  --   CREATININE 0.71  --   TROPONINIHS 275* 1,806*    CrCl cannot be calculated (Unknown ideal weight.).   Medical History: Past Medical History:  Diagnosis Date  . Chronic hypertension 05/18/2019  . Hx of chlamydia infection 03/2016  . Infection    UTI  . Postpartum hypertension 07/09/2016  . Pregnancy induced hypertension    meds PP for 6-8wks  . Uterine fibroid     Assessment: 35 y/o F 2 months post-partum who presents to the ED tonight with chest pain. CT angio is negative for PE. Troponin is rising (275>>>1806). Starting heparin. Possible cath later today. CBC/renal function good. No anti-coagulation PTA.   Goal of Therapy:  Heparin level 0.3-0.7 units/ml Monitor platelets by anticoagulation protocol: Yes   Plan:  Heparin 4000 units BOLUS Start heparin drip at 1000 units/hr 1100 Heparin level Daily CBC/Heparin level Monitor for bleeding  Narda Bonds, PharmD, BCPS Clinical Pharmacist Phone: [phone]

## 2020-06-15 NOTE — ED Notes (Signed)
Pt to the bathroom

## 2020-06-16 ENCOUNTER — Telehealth: Payer: Self-pay | Admitting: Physician Assistant

## 2020-06-16 ENCOUNTER — Inpatient Hospital Stay (HOSPITAL_COMMUNITY): Payer: BC Managed Care – PPO

## 2020-06-16 DIAGNOSIS — R079 Chest pain, unspecified: Secondary | ICD-10-CM | POA: Diagnosis not present

## 2020-06-16 DIAGNOSIS — I2542 Coronary artery dissection: Secondary | ICD-10-CM | POA: Diagnosis not present

## 2020-06-16 DIAGNOSIS — I10 Essential (primary) hypertension: Secondary | ICD-10-CM | POA: Diagnosis not present

## 2020-06-16 DIAGNOSIS — I361 Nonrheumatic tricuspid (valve) insufficiency: Secondary | ICD-10-CM

## 2020-06-16 DIAGNOSIS — I214 Non-ST elevation (NSTEMI) myocardial infarction: Secondary | ICD-10-CM | POA: Diagnosis not present

## 2020-06-16 LAB — ECHOCARDIOGRAM COMPLETE
Area-P 1/2: 2.99 cm2
Calc EF: 58.2 %
Height: 65 in
S' Lateral: 3.2 cm
Single Plane A2C EF: 61.5 %
Single Plane A4C EF: 54.2 %
Weight: 3365.1 oz

## 2020-06-16 LAB — BASIC METABOLIC PANEL
Anion gap: 10 (ref 5–15)
BUN: <5 mg/dL — ABNORMAL LOW (ref 6–20)
CO2: 24 mmol/L (ref 22–32)
Calcium: 9.1 mg/dL (ref 8.9–10.3)
Chloride: 102 mmol/L (ref 98–111)
Creatinine, Ser: 0.63 mg/dL (ref 0.44–1.00)
GFR, Estimated: >60 mL/min (ref >60)
Glucose, Bld: 79 mg/dL (ref 70–99)
Potassium: 3.8 mmol/L (ref 3.5–5.1)
Sodium: 136 mmol/L (ref 135–145)

## 2020-06-16 LAB — MAGNESIUM: Magnesium: 2 mg/dL (ref 1.7–2.4)

## 2020-06-16 MED ORDER — NIFEDIPINE ER OSMOTIC RELEASE 30 MG PO TB24
30.0000 mg | ORAL_TABLET | Freq: Every day | ORAL | Status: DC
  Filled 2020-06-16: qty 1

## 2020-06-16 MED ORDER — NIFEDIPINE ER OSMOTIC RELEASE 30 MG PO TB24
30.0000 mg | ORAL_TABLET | Freq: Every day | ORAL | Status: DC
  Administered 2020-06-16: 30 mg via ORAL
  Filled 2020-06-16: qty 1

## 2020-06-16 MED ORDER — POTASSIUM CHLORIDE CRYS ER 20 MEQ PO TBCR
20.0000 meq | EXTENDED_RELEASE_TABLET | Freq: Once | ORAL | Status: AC
  Administered 2020-06-16: 20 meq via ORAL
  Filled 2020-06-16: qty 1

## 2020-06-16 MED ORDER — ASPIRIN 81 MG PO TBEC: 81.0000 mg | DELAYED_RELEASE_TABLET | Freq: Every day | ORAL | 11 refills | Status: AC

## 2020-06-16 MED ORDER — NIFEDIPINE ER 30 MG PO TB24: 30.0000 mg | ORAL_TABLET | Freq: Every day | ORAL | 2 refills | Status: DC

## 2020-06-16 NOTE — Telephone Encounter (Signed)
    Attention TOC pool,  This patient will need a TOC phone call after discharge. They are being discharged either today or tomorrow. Follow-up appointment has already been arranged with: Dr. Irish Lack on 06/29/20. They are a patient of Larae Grooms, MD (seen by Dr. Meda Coffee in the hospital but upon her relocation will f/u with Dr. Clayton Bibles since he did cath and is familiar with care).  Thank you! Charlie Pitter, PA-C

## 2020-06-16 NOTE — Progress Notes (Signed)
D/C instructions given and reviewed. Tele and IV's removed, tolerated well. 

## 2020-06-16 NOTE — Plan of Care (Signed)
  Problem: Education: Goal: Knowledge of General Education information will improve Description: Including pain rating scale, medication(s)/side effects and non-pharmacologic comfort measures Outcome: Adequate for Discharge   

## 2020-06-16 NOTE — Progress Notes (Signed)
  Echocardiogram 2D Echocardiogram has been performed.  Anita Potter 06/16/2020, 11:28 AM

## 2020-06-16 NOTE — Progress Notes (Signed)
Progress Note  Patient Name: Anita Potter Date of Encounter: 06/16/2020  [facility] Cardiologist: Larae Grooms, MD /Dr. Gwyndolyn Kaufman  Subjective   No chest pain today.  Inpatient Medications    Scheduled Meds: . aspirin EC  81 mg Oral Daily  . potassium chloride  20 mEq Oral Once  . sodium chloride flush  3 mL Intravenous Q12H   Continuous Infusions: . sodium chloride     PRN Meds: sodium chloride, acetaminophen, fentaNYL (SUBLIMAZE) injection, ondansetron (ZOFRAN) IV, sodium chloride flush   Vital Signs    Vitals:   06/16/20 0350 06/16/20 0503 06/16/20 0833 06/16/20 0834  BP:  130/90 139/89 139/89  Pulse:  65 94 88  Resp:  18    Temp:  98.4 F (36.9 C) 98.5 F (36.9 C) 98.5 F (36.9 C)  TempSrc:  Oral Oral Oral  SpO2:  97% 97% 97%  Weight: 95.4 kg     Height:        Intake/Output Summary (Last 24 hours) at 06/16/2020 0954 Last data filed at 06/16/2020 0831 Gross per 24 hour  Intake 360 ml  Output 600 ml  Net -240 ml   Last 3 Weights 06/16/2020 06/15/2020 04/09/2020  Weight (lbs) 210 lb 5.1 oz 210 lb 239 lb 6.4 oz  Weight (kg) 95.4 kg 95.255 kg 108.591 kg      Telemetry    Sinus rhythm with 2 episodes of nonsustained ventricular tachycardia up to 9 beats.- Personally Reviewed  ECG    No new tracing- Personally Reviewed  Physical Exam   GEN: No acute distress.   Neck: No JVD Cardiac: RRR, no murmurs, rubs, or gallops.  Respiratory: Clear to auscultation bilaterally. GI: Soft, nontender, non-distended  MS: No edema; No deformity. Neuro:  Nonfocal  Psych: Normal affect   Labs    High Sensitivity Troponin:   Recent Labs  Lab 06/14/20 2131 06/14/20 2331 06/15/20 0442 06/15/20 1349 06/15/20 1606  TROPONINIHS 275* 1,806* 14,231* 4,130* 3,285*      Chemistry Recent Labs  Lab 06/14/20 2131 06/15/20 0442 06/16/20 0521  NA 138 138 136  K 3.2* 3.6 3.8  CL 107 106 102  CO2 25 25 24   GLUCOSE 93 80 79  BUN 8 <5* <5*   CREATININE 0.71 0.57 0.63  CALCIUM 8.5* 8.4* 9.1  PROT 6.4* 6.7  --   ALBUMIN 3.5 3.6  --   AST 18 57*  --   ALT 17 27  --   ALKPHOS 94 97  --   BILITOT 0.4 0.7  --   GFRNONAA >60 >60 >60  ANIONGAP 6 7 10      Hematology Recent Labs  Lab 06/14/20 2131  WBC 6.9  RBC 4.20  HGB 12.7  HCT 36.2  MCV 86.2  MCH 30.2  MCHC 35.1  RDW 12.2  PLT 213    BNP Recent Labs  Lab 06/14/20 2216  BNP 29.2     DDimer  Recent Labs  Lab 06/14/20 2131  DDIMER 0.52*     Radiology    CT Angio Chest PE W and/or Wo Contrast  Result Date: 06/14/2020 CLINICAL DATA:  Nausea and chest pain after nursing sign EXAM: CT ANGIOGRAPHY CHEST WITH CONTRAST TECHNIQUE: Multidetector CT imaging of the chest was performed using the standard protocol during bolus administration of intravenous contrast. Multiplanar CT image reconstructions and MIPs were obtained to evaluate the vascular anatomy. CONTRAST:  75 mL Omnipaque 350 IV COMPARISON:  Radiograph 06/14/2020 FINDINGS: Cardiovascular: Satisfactory opacification the pulmonary arteries  to the segmental level. No pulmonary artery filling defects are identified. Central pulmonary arteries are normal caliber. Normal heart size. No sizable pericardial effusion with a trace amount of fluid in the pericardial recesses within physiologic normal. Suboptimal opacification of the thoracic aorta for luminal assessment. No gross abnormality of the normal caliber thoracic aorta and normally branching proximal great vessels. Slight reflux of contrast into the IVC and hepatic veins. No other major venous abnormality. Mediastinum/Nodes: No mediastinal fluid or gas. Normal thyroid gland and thoracic inlet. No acute abnormality of the trachea or esophagus. No worrisome mediastinal, hilar or axillary adenopathy. Lungs/Pleura: No consolidation, features of edema, pneumothorax, or effusion. No suspicious pulmonary nodules or masses. Upper Abdomen: No acute abnormalities present in the  visualized portions of the upper abdomen. Musculoskeletal: No acute osseous abnormality or suspicious osseous lesion. No worrisome chest wall masses or lesions. Prominence of the glandular tissue in the breast compatible with lactation related changes. Review of the MIP images confirms the above findings. IMPRESSION: 1. No evidence of pulmonary embolism. 2. Slight reflux of contrast into the IVC and hepatic veins. Finding is nonspecific though can be seen with elevated right heart pressures. 3. No other acute intrathoracic process. Electronically Signed   By: Lovena Le M.D.   On: 06/14/2020 23:29   CARDIAC CATHETERIZATION  Result Date: 06/15/2020  Dist Cx lesion is 75% stenosed. 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 There also appears to be dissection of the distal OM2. This likely explains the wall motion abnormality.  Distal 3rd Mrg lesion is 75% stenosed. 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  Otherwise, large, widely patent coronary arteries without significant disease.  The left ventricular systolic function is normal.  LV end diastolic pressure is mildly elevated.  The left ventricular ejection fraction is 55-65% by visual estimate. Small area of mid inferior hypokinesis.  There is no aortic valve stenosis.  Spoke with husband about the importance of BP control.   No indication for PCI since she has normal flow.  Vessels should heal on its own.   DG Chest Portable 1 View  Result Date: 06/14/2020 CLINICAL DATA:  Chest pain. EXAM: PORTABLE CHEST 1 VIEW COMPARISON:  Radiograph 06/07/2007 FINDINGS: The cardiomediastinal contours are normal. The lungs are clear. Pulmonary vasculature is normal. No consolidation, pleural effusion, or pneumothorax. No acute osseous abnormalities are seen. IMPRESSION: Negative AP view of the chest. Electronically Signed   By: Keith Rake M.D.   On:  06/14/2020 21:48    Cardiac Studies   Cardiac cath: 06/15/2020   Dist Cx lesion is 75% stenosed. 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 There also appears to be dissection of the distal OM2. This likely explains the wall motion abnormality.  Distal 3rd Mrg lesion is 75% stenosed. 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  Otherwise, large, widely patent coronary arteries without significant disease.  The left ventricular systolic function is normal.  LV end diastolic pressure is mildly elevated.  The left ventricular ejection fraction is 55-65% by visual estimate. Small area of mid inferior hypokinesis.  There is no aortic valve stenosis.   Spoke with husband about the importance of BP control.   No indication for PCI since she has normal flow.  Vessels should heal on its own.   Patient Profile  35 y.o. 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  We will slowly wean down nitroglycerin infusion. Heparin drip was discontinued, because of concern of potential intramural hematoma in her coronary arteries, we will start patient on long-term aspirin 81 mg daily.  Echocardiogram to evaluate LVEF and wall motion abnormalities is pending.  She remains hypertensive, she is now off nitroglycerin drip, we will restart nifedipine XL30 mg daily, this seems to be safest drugs during breast-feeding, I had discussion with the patient and she really wants to continue breast-feeding.  In the future if she decides to stop we will switch to ACE or ARB or  potentially carvedilol.  Her echocardiogram is pending however she had wall motion abnormalities on left ventriculogram yesterday and might benefit from either beta-blocker or ARB in the future.  There is no role for lipid-lowering agent in SCAD, as there is no evidence for atherosclerotic disease, in addition she is breast-feeding.  Follow-up appointment has already been arranged with: Dr. Irish Lack on 06/29/20.  For questions or updates, please contact [place] Please consult [email or web address] for contact info under     Signed, Ena Dawley, MD  06/16/2020, 9:54 AM

## 2020-06-16 NOTE — Progress Notes (Signed)
CARDIAC REHAB PHASE I   PRE:  Rate/Rhythm: 66 SR    BP: sitting 138/93    SaO2:   MODE:  Ambulation: 400 ft   POST:  Rate/Rhythm: 91 SR    BP: sitting 147/102      SaO2:   Tolerated well, no CP. Slight SOB. DBP up. Discussed MI, restrictions, meds, diet (esp low sodium), exercise, NTG and CRPII. Pt and husband receptive. Also discussed smoking cessation, which she is motivated to quit. Gave resources, husband planning to quit with her. Will refer to [place], ACSM 06/16/2020 1:54 PM

## 2020-06-16 NOTE — Progress Notes (Signed)
Echo report reviewed with Dr. Meda Coffee. EF normal, diastolic function normal. There was abnormal paradoxical septal motion c/w right ventricular volume overload. Dr. Meda Coffee states the patient was slightly volume overloaded but would not diurese her as she is breastfeeding but tell her to limit salt. Dr. Meda Coffee is still OK with discharge plan as outlined (ASA, Nifedipine). She did not think the patient needed additional electrolyte supplementation at DC but can consider rechecking as outpatient. We added post-cath instructions to her DC instructions section. I spoke with the patient about these results on the phone and relayed to IM. Just waiting on cardiac rehab to see for education prior to DC. Jamerion Cabello PA-C

## 2020-06-16 NOTE — Discharge Instructions (Signed)
POST CATH INSTRUCTIONS  No driving for 1 week. No lifting over 10lb or sexual activity until cleared by your cardiologist. You may not return to work until cleared by your cardiology team. Keep procedure site clean & dry. If you notice increased pain, swelling, bleeding or pus, call/return!  You may shower, but no soaking baths/hot tubs/pools for 1 week.   Please monitor your blood pressure occasionally at home. Call your doctor if you tend to get readings of greater than 130 on the top number or 80 on the bottom number. We would recommend using a blood pressure cuff that goes on your arm. The wrist ones can be inaccurate. To check your blood pressure, choose a time at least 3 hours after taking your blood pressure medicines. If you can sample it at different times of the day, that's great - it might give you more information about how your blood pressure fluctuates. Remain seated in a chair for 5 minutes quietly beforehand, then check it. Bring this log to your follow-up visit but call us if you tend to run elevated before your next visit.  It is important to quit smoking.  Dr. Meda Coffee recommends that you limit salt in your diet. This is not just the salt that comes from a salt shaker but also the salt ("sodium") already in foods like processed foods, canned goods, frozen meals and take-out. It is helpful to weigh yourself every day to help monitor for any symptoms of fluid overload.  Call your doctor at [phone] anytime you feel any of the following symptoms: - 3lb weight gain overnight or 5lb within a few days - Shortness of breath, with or without a dry hacking cough  - Swelling in the hands, feet or stomach  - If you have to sleep on extra pillows at night in order to breathe   IT IS IMPORTANT TO LET YOUR DOCTOR KNOW EARLY ON IF YOU ARE HAVING SYMPTOMS SO WE CAN HELP YOU!

## 2020-06-16 NOTE — Discharge Summary (Signed)
Physician Discharge Summary  Anita Potter NFA:[id number] DOB: [birth year] DOA: 06/14/2020  PCP: Merryl Hacker, No  Admit date: 06/14/2020 Discharge date: 06/16/2020  Admitted From: Home Disposition: Home  Recommendations for Outpatient Follow-up:  1. Follow up with PCP in 1-2 weeks 2. Please obtain BMP/CBC in one week 3. Cardiology will schedule follow-up.  Also follow-up will be scheduled for cardiac rehab.  Home Health: Not applicable Equipment/Devices: Not needed.  Discharge Condition: Stable. CODE STATUS: Full code Diet recommendation: Low-salt diet  Discharge summary: 35 year old female, 2 months postpartum with history of hypertension and currently on nifedipine presented to this hospital with acute onset of chest pain, greatly elevated troponin to 14,000.  She was admitted with infusion of nitro and heparin.  Seen by cardiology.  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 CTA of the chest negative for pulmonary embolism or aortic dissection.  Cardiac cath on 06/15/20  Dist Cx lesion is 75% stenosed. 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 There also appears to be dissection of the distal OM2. This likely explains the wall motion abnormality.  Distal 3rd Mrg lesion is 75% stenosed. 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  Otherwise, large, widely patent coronary arteries without significant disease.  The left ventricular systolic function is normal.  LV end diastolic pressure is mildly elevated.  The left ventricular ejection fraction is 55-65% by visual estimate. Small area of mid inferior hypokinesis.  There is no aortic valve stenosis.  Patient has no chest pain or complaints since admission to the hospital.  Cardiac cath with above findings, widely patent major coronary arteries.  Echocardiogram with normal  ejection fraction.  Plan: As per cardiology recommendation, going home with aspirin 81 mg daily for cardiovascular protection, she is breast-feeding , considered safe in her case with low-dose aspirin even though she is breast-feeding. Statins not recommended, breast-feeding. Cardiac rehab recommended, they will schedule. Good blood pressure control recommended, she is lactating so we will continue nifedipine XL 30 mg daily.  Patient is a stable to discharge home today.   Discharge Diagnoses:  Principal Problem:   NSTEMI (non-ST elevated myocardial infarction) [place]) Active Problems:   Essential hypertension   Nicotine dependence, cigarettes, uncomplicated   Chest pain   Spontaneous dissection of coronary artery    Discharge Instructions  Discharge Instructions    Call MD for:  difficulty breathing, headache or visual disturbances   Complete by: As directed    Call MD for:  extreme fatigue   Complete by: As directed    Call MD for:  severe uncontrolled pain   Complete by: As directed    Diet - low sodium heart healthy   Complete by: As directed    Increase activity slowly   Complete by: As directed      Allergies as of 06/16/2020   No Known Allergies     Medication List    STOP taking these medications   dicyclomine 10 MG capsule Commonly known as: Bentyl   hydrOXYzine 25 MG tablet Commonly known as: ATARAX/VISTARIL   ibuprofen 600 MG tablet Commonly known as: ADVIL     TAKE these medications   acetaminophen 325 MG tablet Commonly known as: Tylenol Take 2 tablets (650 mg total) by mouth every 4 (four) hours as needed (for pain scale < 4).   aspirin 81 MG EC tablet Take 1 tablet (81 mg total) by  mouth daily. Swallow whole. Start taking on: June 17, 2020   multivitamin-prenatal 27-0.8 MG Tabs tablet Take 1 tablet by mouth daily at 12 noon.   NIFEdipine 30 MG 24 hr tablet Commonly known as: ADALAT CC Take 1 tablet (30 mg total) by mouth daily.        Follow-up Information    Jettie Booze, MD Follow up.   Specialties: Cardiology, Radiology, Interventional Cardiology Why: [facility] (Cardiology) - [facility] location - a follow-up has been arranged for you on Tuesday June 29, 2020 at 11:40 AM (Arrive by 11:25 AM). Dr. Irish Lack is the doctor that did your heart catheterization.  Contact information: [address] [place] [address] [phone]              No Known Allergies  Consultations:  Cardiology   Procedures/Studies: CT Angio Chest PE W and/or Wo Contrast  Result Date: 06/14/2020 CLINICAL DATA:  Nausea and chest pain after nursing sign EXAM: CT ANGIOGRAPHY CHEST WITH CONTRAST TECHNIQUE: Multidetector CT imaging of the chest was performed using the standard protocol during bolus administration of intravenous contrast. Multiplanar CT image reconstructions and MIPs were obtained to evaluate the vascular anatomy. CONTRAST:  75 mL Omnipaque 350 IV COMPARISON:  Radiograph 06/14/2020 FINDINGS: Cardiovascular: Satisfactory opacification the pulmonary arteries to the segmental level. No pulmonary artery filling defects are identified. Central pulmonary arteries are normal caliber. Normal heart size. No sizable pericardial effusion with a trace amount of fluid in the pericardial recesses within physiologic normal. Suboptimal opacification of the thoracic aorta for luminal assessment. No gross abnormality of the normal caliber thoracic aorta and normally branching proximal great vessels. Slight reflux of contrast into the IVC and hepatic veins. No other major venous abnormality. Mediastinum/Nodes: No mediastinal fluid or gas. Normal thyroid gland and thoracic inlet. No acute abnormality of the trachea or esophagus. No worrisome mediastinal, hilar or axillary adenopathy. Lungs/Pleura: No consolidation, features of edema, pneumothorax, or effusion. No suspicious pulmonary nodules or masses. Upper Abdomen: No  acute abnormalities present in the visualized portions of the upper abdomen. Musculoskeletal: No acute osseous abnormality or suspicious osseous lesion. No worrisome chest wall masses or lesions. Prominence of the glandular tissue in the breast compatible with lactation related changes. Review of the MIP images confirms the above findings. IMPRESSION: 1. No evidence of pulmonary embolism. 2. Slight reflux of contrast into the IVC and hepatic veins. Finding is nonspecific though can be seen with elevated right heart pressures. 3. No other acute intrathoracic process. Electronically Signed   By: Lovena Le M.D.   On: 06/14/2020 23:29   CARDIAC CATHETERIZATION  Result Date: 06/15/2020  Dist Cx lesion is 75% stenosed. 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 There also appears to be dissection of the distal OM2. This likely explains the wall motion abnormality.  Distal 3rd Mrg lesion is 75% stenosed. 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  Otherwise, large, widely patent coronary arteries without significant disease.  The left ventricular systolic function is normal.  LV end diastolic pressure is mildly elevated.  The left ventricular ejection fraction is 55-65% by visual estimate. Small area of mid inferior hypokinesis.  There is no aortic valve stenosis.  Spoke with husband about the importance of BP control.   No indication for PCI since she has normal flow.  Vessels should heal on its own.   DG Chest Portable 1  View  Result Date: 06/14/2020 CLINICAL DATA:  Chest pain. EXAM: PORTABLE CHEST 1 VIEW COMPARISON:  Radiograph 06/07/2007 FINDINGS: The cardiomediastinal contours are normal. The lungs are clear. Pulmonary vasculature is normal. No consolidation, pleural effusion, or pneumothorax. No acute osseous abnormalities are seen. IMPRESSION: Negative AP view of the chest. Electronically Signed   By:  Keith Rake M.D.   On: 06/14/2020 21:48   ECHOCARDIOGRAM COMPLETE  Result Date: 06/16/2020    ECHOCARDIOGRAM REPORT   Patient Name:   Anita Potter Date of Exam: 06/16/2020 Medical Rec #:  [id number]         Height:       65.0 in Accession #:    [id number]        Weight:       210.3 lb Date of Birth:  1986/03/18         BSA:          2.021 m Patient Age:    71 years          BP:           139/89 mmHg Patient Gender: F                 HR:           73 bpm. Exam Location:  Inpatient Procedure: 2D Echo, Cardiac Doppler and Color Doppler Indications:    Chest Pain R07.9  History:        Patient has no prior history of Echocardiogram examinations.                 Risk Factors:Hypertension and Current Smoker.  Sonographer:    Vickie Epley RDCS Referring Phys: [id number] [place]  1. Left ventricular ejection fraction, by estimation, is 60 to 65%. The left ventricle has normal function. The left ventricle has no regional wall motion abnormalities. Left ventricular diastolic parameters were normal. There is abnormal (paradoxical) septal motion, consistent with right ventricular volume overload.  2. Right ventricular systolic function is normal. The right ventricular size is normal.  3. The mitral valve is normal in structure. No evidence of mitral valve regurgitation. No evidence of mitral stenosis.  4. The aortic valve is normal in structure. Aortic valve regurgitation is not visualized. No aortic stenosis is present.  5. The inferior vena cava is normal in size with greater than 50% respiratory variability, suggesting right atrial pressure of 3 mmHg. FINDINGS  Left Ventricle: Left ventricular ejection fraction, by estimation, is 60 to 65%. The left ventricle has normal function. The left ventricle has no regional wall motion abnormalities. Global longitudinal strain performed but not reported based on interpreter judgement due to suboptimal tracking. 3D left ventricular ejection fraction  analysis performed but not reported based on interpreter judgement due to suboptimal quality. The left ventricular internal cavity size was normal in size. There is no left ventricular hypertrophy. Abnormal (paradoxical) septal motion, consistent with right ventricular volume overload. Left ventricular diastolic parameters were normal. Right Ventricle: The right ventricular size is normal. No increase in right ventricular wall thickness. Right ventricular systolic function is normal. Left Atrium: Left atrial size was normal in size. Right Atrium: Right atrial size was normal in size. Pericardium: There is no evidence of pericardial effusion. Mitral Valve: The mitral valve is normal in structure. No evidence of mitral valve regurgitation. No evidence of mitral valve stenosis. Tricuspid Valve: The tricuspid valve is normal in structure. Tricuspid valve regurgitation is mild . No evidence of tricuspid stenosis. Aortic  Valve: The aortic valve is normal in structure. Aortic valve regurgitation is not visualized. No aortic stenosis is present. Pulmonic Valve: The pulmonic valve was normal in structure. Pulmonic valve regurgitation is not visualized. No evidence of pulmonic stenosis. Aorta: The aortic root is normal in size and structure. Venous: The inferior vena cava is normal in size with greater than 50% respiratory variability, suggesting right atrial pressure of 3 mmHg. IAS/Shunts: No atrial level shunt detected by color flow Doppler.  LEFT VENTRICLE PLAX 2D LVIDd:         4.60 cm      Diastology LVIDs:         3.20 cm      LV e' medial:    8.59 cm/s LV PW:         0.70 cm      LV E/e' medial:  7.9 LV IVS:        0.70 cm      LV e' lateral:   12.30 cm/s LVOT diam:     2.10 cm      LV E/e' lateral: 5.5 LV SV:         71 LV SV Index:   35 LVOT Area:     3.46 cm  LV Volumes (MOD) LV vol d, MOD A2C: 137.0 ml LV vol d, MOD A4C: 136.0 ml LV vol s, MOD A2C: 52.8 ml LV vol s, MOD A4C: 62.3 ml LV SV MOD A2C:     84.2 ml LV  SV MOD A4C:     136.0 ml LV SV MOD BP:      81.9 ml RIGHT VENTRICLE RV S prime:     11.50 cm/s TAPSE (M-mode): 2.2 cm LEFT ATRIUM             Index       RIGHT ATRIUM           Index LA diam:        3.20 cm 1.58 cm/m  RA Area:     11.80 cm LA Vol (A2C):   31.5 ml 15.59 ml/m RA Volume:   23.50 ml  11.63 ml/m LA Vol (A4C):   40.2 ml 19.89 ml/m LA Biplane Vol: 39.5 ml 19.55 ml/m  AORTIC VALVE LVOT Vmax:   96.00 cm/s LVOT Vmean:  72.700 cm/s LVOT VTI:    0.204 m  AORTA Ao Root diam: 2.70 cm MITRAL VALVE               TRICUSPID VALVE MV Area (PHT): 2.99 cm    TR Peak grad:   18.5 mmHg MV Decel Time: 254 msec    TR Vmax:        215.00 cm/s MV E velocity: 67.70 cm/s MV A velocity: 53.10 cm/s  SHUNTS MV E/A ratio:  1.27        Systemic VTI:  0.20 m                            Systemic Diam: 2.10 cm Jenkins Rouge MD Electronically signed by Jenkins Rouge MD Signature Date/Time: 06/16/2020/11:28:05 AM    Final    (Echo, Carotid, EGD, Colonoscopy, ERCP)    Subjective: Patient seen and examined.  She has some headache with nitro infusion.  Blood pressure well controlled.  She is eager to go home.   Discharge Exam: Vitals:   06/16/20 0834 06/16/20 1151  BP: 139/89 132/87  Pulse: 88 73  Resp:  Temp: 98.5 F (36.9 C)   SpO2: 97% 98%   Vitals:   06/16/20 0503 06/16/20 0833 06/16/20 0834 06/16/20 1151  BP: 130/90 139/89 139/89 132/87  Pulse: 65 94 88 73  Resp: 18     Temp: 98.4 F (36.9 C) 98.5 F (36.9 C) 98.5 F (36.9 C)   TempSrc: Oral Oral Oral   SpO2: 97% 97% 97% 98%  Weight:      Height:        General: Pt is alert, awake, not in acute distress Cardiovascular: RRR, S1/S2 +, no rubs, no gallops Respiratory: CTA bilaterally, no wheezing, no rhonchi Abdominal: Soft, NT, ND, bowel sounds + Extremities: no edema, no cyanosis    The results of significant diagnostics from this hospitalization (including imaging, microbiology, ancillary and laboratory) are listed below for reference.      Microbiology: Recent Results (from the past 240 hour(s))  Resp Panel by RT-PCR (Flu A&B, Covid) Nasopharyngeal Swab     Status: None   Collection Time: 06/15/20  1:14 AM   Specimen: Nasopharyngeal Swab; Nasopharyngeal(NP) swabs in vial transport medium  Result Value Ref Range Status   SARS Coronavirus 2 by RT PCR NEGATIVE NEGATIVE Final    Comment: (NOTE) SARS-CoV-2 target nucleic acids are NOT DETECTED.  The SARS-CoV-2 RNA is generally detectable in upper respiratory specimens during the acute phase of infection. The lowest concentration of SARS-CoV-2 viral copies this assay can detect is 138 copies/mL. A negative result does not preclude SARS-Cov-2 infection and should not be used as the sole basis for treatment or other patient management decisions. A negative result may occur with  improper specimen collection/handling, submission of specimen other than nasopharyngeal swab, presence of viral mutation(s) within the areas targeted by this assay, and inadequate number of viral copies(<138 copies/mL). A negative result must be combined with clinical observations, patient history, and epidemiological information. The expected result is Negative.  Fact Sheet for Patients:  EntrepreneurPulse.com.au  Fact Sheet for Healthcare Providers:  IncredibleEmployment.be  This test is no t yet approved or cleared by the [place] FDA and  has been authorized for detection and/or diagnosis of SARS-CoV-2 by FDA under an Emergency Use Authorization (EUA). This EUA will remain  in effect (meaning this test can be used) for the duration of the COVID-19 declaration under Section 564(b)(1) of the Act, 21 U.S.C.section 360bbb-3(b)(1), unless the authorization is terminated  or revoked sooner.       Influenza A by PCR NEGATIVE NEGATIVE Final   Influenza B by PCR NEGATIVE NEGATIVE Final    Comment: (NOTE) The Xpert Xpress SARS-CoV-2/FLU/RSV plus assay is  intended as an aid in the diagnosis of influenza from Nasopharyngeal swab specimens and should not be used as a sole basis for treatment. Nasal washings and aspirates are unacceptable for Xpert Xpress SARS-CoV-2/FLU/RSV testing.  Fact Sheet for Patients: EntrepreneurPulse.com.au  Fact Sheet for Healthcare Providers: IncredibleEmployment.be  This test is not yet approved or cleared by the [place] FDA and has been authorized for detection and/or diagnosis of SARS-CoV-2 by FDA under an Emergency Use Authorization (EUA). This EUA will remain in effect (meaning this test can be used) for the duration of the COVID-19 declaration under Section 564(b)(1) of the Act, 21 U.S.C. section 360bbb-3(b)(1), unless the authorization is terminated or revoked.  Performed at [facility], [place] [address], [place],  [address]      Labs: BNP (last 3 results) Recent Labs    06/14/20 2216  BNP 29.2  Basic Metabolic Panel: Recent Labs  Lab 06/14/20 2131 06/15/20 0442 06/15/20 1349 06/16/20 0521  NA 138 138  --  136  K 3.2* 3.6  --  3.8  CL 107 106  --  102  CO2 25 25  --  24  GLUCOSE 93 80  --  79  BUN 8 <5*  --  <5*  CREATININE 0.71 0.57  --  0.63  CALCIUM 8.5* 8.4*  --  9.1  MG  --   --  1.8 2.0   Liver Function Tests: Recent Labs  Lab 06/14/20 2131 06/15/20 0442  AST 18 57*  ALT 17 27  ALKPHOS 94 97  BILITOT 0.4 0.7  PROT 6.4* 6.7  ALBUMIN 3.5 3.6   Recent Labs  Lab 06/14/20 2131  LIPASE 28   No results for input(s): AMMONIA in the last 168 hours. CBC: Recent Labs  Lab 06/14/20 2131  WBC 6.9  NEUTROABS 4.1  HGB 12.7  HCT 36.2  MCV 86.2  PLT 213   Cardiac Enzymes: No results for input(s): CKTOTAL, CKMB, CKMBINDEX, TROPONINI in the last 168 hours. BNP: Invalid input(s): POCBNP CBG: No results for input(s): GLUCAP in the last 168 hours. D-Dimer Recent Labs    06/14/20 2131  DDIMER 0.52*   Hgb  A1c Recent Labs    06/15/20 0515  HGBA1C 5.5   Lipid Profile Recent Labs    06/15/20 0442  CHOL 182  HDL 41  LDLCALC 126*  TRIG 75  CHOLHDL 4.4   Thyroid function studies Recent Labs    06/15/20 0442  TSH 2.916   Anemia work up No results for input(s): VITAMINB12, FOLATE, FERRITIN, TIBC, IRON, RETICCTPCT in the last 72 hours. Urinalysis    Component Value Date/Time   COLORURINE YELLOW 02/21/2020 1026   APPEARANCEUR HAZY (A) 02/21/2020 1026   LABSPEC 1.025 02/21/2020 1026   PHURINE 5.0 02/21/2020 1026   GLUCOSEU NEGATIVE 02/21/2020 1026   HGBUR SMALL (A) 02/21/2020 1026   BILIRUBINUR NEGATIVE 02/21/2020 1026   KETONESUR 20 (A) 02/21/2020 1026   PROTEINUR 30 (A) 02/21/2020 1026   UROBILINOGEN 0.2 10/30/2017 1000   NITRITE NEGATIVE 02/21/2020 1026   LEUKOCYTESUR NEGATIVE 02/21/2020 1026   Sepsis Labs Invalid input(s): PROCALCITONIN,  WBC,  LACTICIDVEN Microbiology Recent Results (from the past 240 hour(s))  Resp Panel by RT-PCR (Flu A&B, Covid) Nasopharyngeal Swab     Status: None   Collection Time: 06/15/20  1:14 AM   Specimen: Nasopharyngeal Swab; Nasopharyngeal(NP) swabs in vial transport medium  Result Value Ref Range Status   SARS Coronavirus 2 by RT PCR NEGATIVE NEGATIVE Final    Comment: (NOTE) SARS-CoV-2 target nucleic acids are NOT DETECTED.  The SARS-CoV-2 RNA is generally detectable in upper respiratory specimens during the acute phase of infection. The lowest concentration of SARS-CoV-2 viral copies this assay can detect is 138 copies/mL. A negative result does not preclude SARS-Cov-2 infection and should not be used as the sole basis for treatment or other patient management decisions. A negative result may occur with  improper specimen collection/handling, submission of specimen other than nasopharyngeal swab, presence of viral mutation(s) within the areas targeted by this assay, and inadequate number of viral copies(<138 copies/mL). A negative  result must be combined with clinical observations, patient history, and epidemiological information. The expected result is Negative.  Fact Sheet for Patients:  EntrepreneurPulse.com.au  Fact Sheet for Healthcare Providers:  IncredibleEmployment.be  This test is no t yet approved or cleared by the [place] and  has been authorized for detection and/or diagnosis of SARS-CoV-2 by FDA under an Emergency Use Authorization (EUA). This EUA will remain  in effect (meaning this test can be used) for the duration of the COVID-19 declaration under Section 564(b)(1) of the Act, 21 U.S.C.section 360bbb-3(b)(1), unless the authorization is terminated  or revoked sooner.       Influenza A by PCR NEGATIVE NEGATIVE Final   Influenza B by PCR NEGATIVE NEGATIVE Final    Comment: (NOTE) The Xpert Xpress SARS-CoV-2/FLU/RSV plus assay is intended as an aid in the diagnosis of influenza from Nasopharyngeal swab specimens and should not be used as a sole basis for treatment. Nasal washings and aspirates are unacceptable for Xpert Xpress SARS-CoV-2/FLU/RSV testing.  Fact Sheet for Patients: EntrepreneurPulse.com.au  Fact Sheet for Healthcare Providers: IncredibleEmployment.be  This test is not yet approved or cleared by the [place] FDA and has been authorized for detection and/or diagnosis of SARS-CoV-2 by FDA under an Emergency Use Authorization (EUA). This EUA will remain in effect (meaning this test can be used) for the duration of the COVID-19 declaration under Section 564(b)(1) of the Act, 21 U.S.C. section 360bbb-3(b)(1), unless the authorization is terminated or revoked.  Performed at [facility], [place] [address] [place], [place] [address]      Time coordinating discharge:  32 minutes  SIGNED:   Barb Merino, MD  [facility] 06/16/2020, 1:33 PM

## 2020-06-17 NOTE — Telephone Encounter (Signed)
**Note De-Identified  Obfuscation** Transition Care Management Unsuccessful Follow-up Telephone Call  Date of discharge and from where:  06/16/2020 from [facility] hospital  Attempts:  1st  Reason for unsuccessful TCM follow-up call: No answer so I left a message on the pts VM asking her to call Jeani Hawking at [phone] at Dr Hassell Done office at [facility].

## 2020-06-18 NOTE — Telephone Encounter (Signed)
Yes. OK to be out of work until 4/12. Thanks.  JV

## 2020-06-18 NOTE — Telephone Encounter (Signed)
**Note De-Identified  Obfuscation** Patient contacted regarding discharge from [facility] on 06/16/2020.  Patient understands to follow up with Dr Irish Lack on 07/01/2020 at 11:40 at [address], [place] in [place], [place] [address]. Patient understands discharge instructions? Yes Patient understands medications and regiment? Yes Patient understands to bring all medications to this visit? Yes  Ask patient:  Are you enrolled in My Chart: Yes   The pt states that she is doing okay and denies CP/discomfort, SOB, dizziness, lightheadedness, nausea, or diaphoresis.  She states that even though she feels okay she is very nervious about going back to work on date planned and is requesting to remain out of work until her f/u on 4/12 with Dr Irish Lack. I have advised her that I do not think that Dr Irish Lack will have any issues with this but that I will forward this TCM Call to him for advisement. She is aware that if we do not call her back that it is okay to remain out of work until her f/u and that we will give her a return to work letter at that time.  She has [facility] phone number to call if she has any questions or concerns. She thanked me for my call.

## 2020-06-23 ENCOUNTER — Telehealth (HOSPITAL_COMMUNITY): Payer: Self-pay

## 2020-06-23 NOTE — Telephone Encounter (Signed)
Pt insurance is active and benefits verified through [employer]. Co-pay $0.00, DED $3,000.00/$3,000.00 met, out of pocket $4,750.00/$4,692.71 met, co-insurance 20%. No pre-authorization required. Passport, 06/23/20 @ 2:34PM, KPW#[id number]  Will contact patient to see if she is interested in the Cardiac Rehab Program. If interested, patient will need to complete follow up appt. Once completed, patient will be contacted for scheduling upon review by the RN Navigator.

## 2020-06-23 NOTE — Telephone Encounter (Signed)
Attempted to call patient in regards to Cardiac Rehab - LM on VM 

## 2020-06-27 NOTE — Progress Notes (Signed)
Cardiology Office Note   Date:  06/27/2020   ID:  Anita Potter, DOB 1985-09-02, MRN [id number]  PCP:  Pcp, No    No chief complaint on file.  SCAD  Wt Readings from Last 3 Encounters:  06/16/20 210 lb 5.1 oz (95.4 kg)  04/09/20 239 lb 6.4 oz (108.6 kg)  04/07/20 238 lb 6.4 oz (108.1 kg)       History of Present Illness: Anita Potter is a 35 y.o. female  Who had SCAD causing NSTEMI in 3/22.  Cath showed:  "Dist Cx lesion is 75% stenosed. 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 There also appears to be dissection of the distal OM2. This likely explains the wall motion abnormality.  Distal 3rd Mrg lesion is 75% stenosed. 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  Otherwise, large, widely patent coronary arteries without significant disease.  The left ventricular systolic function is normal.  LV end diastolic pressure is mildly elevated.  The left ventricular ejection fraction is 55-65% by visual estimate. Small area of mid inferior hypokinesis.  There is no aortic valve stenosis.   Spoke with husband about the importance of BP control.   No indication for PCI since she has normal flow.  Vessels should heal on its own. "    BP at home is in the 130/90s before medicine.  After medicine, it is in the 120/80 range.    Went to ER yesterday for palpitations.  Sx are several times a week.  No triggers.  Happens at rest.    Denies : Chest pain. Dizziness. Leg edema. Nitroglycerin use. Orthopnea. Palpitations. Paroxysmal nocturnal dyspnea. Shortness of breath. Syncope.   HTN runs on the family.      Past Medical History:  Diagnosis Date  . Chronic hypertension 05/18/2019  . History of gestational hypertension 06/15/2020  . Hx of chlamydia infection 03/2016  . Infection    UTI  . Postpartum hypertension 07/09/2016  . Pregnancy induced hypertension    meds PP  for 6-8wks  . Uterine fibroid     Past Surgical History:  Procedure Laterality Date  . CARDIAC CATHETERIZATION  06/15/2020  . LEFT HEART CATH AND CORONARY ANGIOGRAPHY N/A 06/15/2020   Procedure: LEFT HEART CATH AND CORONARY ANGIOGRAPHY;  Surgeon: Jettie Booze, MD;  Location: [facility];  Service: Cardiovascular;  Laterality: N/A;  . NO PAST SURGERIES       Current Outpatient Medications  Medication Sig Dispense Refill  . acetaminophen (TYLENOL) 325 MG tablet Take 2 tablets (650 mg total) by mouth every 4 (four) hours as needed (for pain scale < 4). (Patient not taking: Reported on 06/15/2020) 30 tablet   . aspirin EC 81 MG EC tablet Take 1 tablet (81 mg total) by mouth daily. Swallow whole. 30 tablet 11  . NIFEdipine (ADALAT CC) 30 MG 24 hr tablet Take 1 tablet (30 mg total) by mouth daily. 30 tablet 2  . Prenatal Vit-Fe Fumarate-FA (MULTIVITAMIN-PRENATAL) 27-0.8 MG TABS tablet Take 1 tablet by mouth daily at 12 noon.     No current facility-administered medications for this visit.    Allergies:   Patient has no known allergies.    Social History:  The patient  reports that she has been smoking cigarettes. She has been smoking about 0.33 packs per day. She has never used smokeless tobacco. She reports previous alcohol use. She reports that she  does not use drugs.   Family History:  The patient's family history includes Hypertension in her mother; Lung cancer in her father.    ROS:  Please see the history of present illness.   Otherwise, review of systems are positive for anxiety.   All other systems are reviewed and negative.    PHYSICAL EXAM: VS:  LMP 06/09/2020  , BMI There is no height or weight on file to calculate BMI. GEN: Well nourished, well developed, in no acute distress  HEENT: normal  Neck: no JVD, carotid bruits, or masses Cardiac: RRR; no murmurs, rubs, or gallops,no edema  Respiratory:  clear to auscultation bilaterally, normal work of  breathing GI: soft, nontender, nondistended, + BS MS: no deformity or atrophy ; 2+ right radial pulse; normal hand grip strength bilaterally Skin: warm and dry, no rash Neuro:  Strength and sensation are intact Psych: euthymic mood, full affect   EKG:   The ekg ordered yesterday demonstrates NSR, PRWP, nonspecific ST changes   Recent Labs: 06/14/2020: B Natriuretic Peptide 29.2; Hemoglobin 12.7; Platelets 213 06/15/2020: ALT 27; TSH 2.916 06/16/2020: BUN <5; Creatinine, Ser 0.63; Magnesium 2.0; Potassium 3.8; Sodium 136   Lipid Panel    Component Value Date/Time   CHOL 182 06/15/2020 0442   TRIG 75 06/15/2020 0442   HDL 41 06/15/2020 0442   CHOLHDL 4.4 06/15/2020 0442   VLDL 15 06/15/2020 0442   LDLCALC 126 (H) 06/15/2020 0442     Other studies Reviewed: Additional studies/ records that were reviewed today with results demonstrating: ECG, labs reviewed from yesterday ER visit. .   ASSESSMENT AND PLAN:  1.   SCAD/ recent MI: ER visit showed troponin was low, compared to prior. No anginal chest pain.  Increased anxiety which is very understandable.  2.   HTN: The current medical regimen is effective;  continue present plan and medications.  We talked a bout using a salt substitute, especially since she has had low potassium. Continue nifedipine.  She has used this for her prior pregnancies.  If HTN persists, then would consider a beta blocker if palpitations persist, as long as safe from a breast feeding standpoint.  3.   Gave birth recently. May have contributed to likelihood of SCAD.  She is not planning to have any more kids.  I did counsel her that a future pregnancy may pose a higher risk for her given this episode of SCAD.  4.   Palpitations: Plan for 2 week Zio patch.    Current medicines are reviewed at length with the patient today.  The patient concerns regarding her medicines were addressed.  The following changes have been made:  No change  Labs/ tests ordered today  include:  No orders of the defined types were placed in this encounter.   Recommend 150 minutes/week of aerobic exercise Low fat, low carb, high fiber diet recommended  Disposition:   FU in 2 months   Signed, Larae Grooms, MD  06/27/2020 3:11 PM    [facility] [address], [place], [place]  [address] Phone: [phone]; Fax: [phone]

## 2020-06-28 ENCOUNTER — Emergency Department (HOSPITAL_COMMUNITY): Payer: BC Managed Care – PPO

## 2020-06-28 ENCOUNTER — Emergency Department (HOSPITAL_COMMUNITY)
Admission: EM | Admit: 2020-06-28 | Discharge: 2020-06-28 | Disposition: A | Discharge status: Home / Self Care | Payer: BC Managed Care – PPO | Attending: Emergency Medicine | Admitting: Emergency Medicine

## 2020-06-28 ENCOUNTER — Other Ambulatory Visit: Payer: Self-pay

## 2020-06-28 DIAGNOSIS — I10 Essential (primary) hypertension: Secondary | ICD-10-CM | POA: Insufficient documentation

## 2020-06-28 DIAGNOSIS — I2542 Coronary artery dissection: Secondary | ICD-10-CM

## 2020-06-28 DIAGNOSIS — R079 Chest pain, unspecified: Secondary | ICD-10-CM | POA: Diagnosis present

## 2020-06-28 DIAGNOSIS — F17210 Nicotine dependence, cigarettes, uncomplicated: Secondary | ICD-10-CM | POA: Insufficient documentation

## 2020-06-28 DIAGNOSIS — R0789 Other chest pain: Secondary | ICD-10-CM | POA: Diagnosis not present

## 2020-06-28 DIAGNOSIS — R072 Precordial pain: Secondary | ICD-10-CM

## 2020-06-28 DIAGNOSIS — Z7982 Long term (current) use of aspirin: Secondary | ICD-10-CM | POA: Diagnosis not present

## 2020-06-28 DIAGNOSIS — Z79899 Other long term (current) drug therapy: Secondary | ICD-10-CM | POA: Insufficient documentation

## 2020-06-28 DIAGNOSIS — E876 Hypokalemia: Secondary | ICD-10-CM

## 2020-06-28 LAB — COMPREHENSIVE METABOLIC PANEL
ALT: 18 U/L (ref 0–44)
AST: 19 U/L (ref 15–41)
Albumin: 3.9 g/dL (ref 3.5–5.0)
Alkaline Phosphatase: 94 U/L (ref 38–126)
Anion gap: 9 (ref 5–15)
BUN: 10 mg/dL (ref 6–20)
CO2: 21 mmol/L — ABNORMAL LOW (ref 22–32)
Calcium: 9.2 mg/dL (ref 8.9–10.3)
Chloride: 106 mmol/L (ref 98–111)
Creatinine, Ser: 0.62 mg/dL (ref 0.44–1.00)
GFR, Estimated: >60 mL/min (ref >60)
Glucose, Bld: 95 mg/dL (ref 70–99)
Potassium: 3.4 mmol/L — ABNORMAL LOW (ref 3.5–5.1)
Sodium: 136 mmol/L (ref 135–145)
Total Bilirubin: 0.5 mg/dL (ref 0.3–1.2)
Total Protein: 7.2 g/dL (ref 6.5–8.1)

## 2020-06-28 LAB — CBC
HCT: 41.7 % (ref 36.0–46.0)
Hemoglobin: 14 g/dL (ref 12.0–15.0)
MCH: 30 pg (ref 26.0–34.0)
MCHC: 33.6 g/dL (ref 30.0–36.0)
MCV: 89.3 fL (ref 80.0–100.0)
Platelets: 292 10*3/uL (ref 150–400)
RBC: 4.67 MIL/uL (ref 3.87–5.11)
RDW: 12.2 % (ref 11.5–15.5)
WBC: 8.5 10*3/uL (ref 4.0–10.5)
nRBC: 0 % (ref 0.0–0.2)

## 2020-06-28 LAB — TROPONIN I (HIGH SENSITIVITY): Troponin I (High Sensitivity): 37 ng/L — ABNORMAL HIGH (ref <18)

## 2020-06-28 MED ORDER — POTASSIUM CHLORIDE CRYS ER 20 MEQ PO TBCR
40.0000 meq | EXTENDED_RELEASE_TABLET | Freq: Once | ORAL | Status: AC
  Administered 2020-06-28: 40 meq via ORAL
  Filled 2020-06-28: qty 2

## 2020-06-28 NOTE — Consult Note (Signed)
Cardiology Consultation:   Patient ID: Anita Potter MRN: [id number]; DOB: 02-26-1986  Admit date: 06/28/2020 Date of Consult: 06/28/2020  PCP:  Kathyrn Lass   [place]  Cardiologist:  Larae Grooms, MD  Advanced Practice Provider:  No care team member to display Electrophysiologist:  None        Patient Profile:   Anita Potter is a 35 y.o. female with a hx of SCAD who is being seen today for the evaluation of chest pain at the request of Dr. Lajean Saver.  History of Present Illness:   [name] is a 35 year old female approximately 2 months postpartum who presented with acute onset chest pain with troponin elevation of 14,000 who underwent cardiac catheterization on 06/15/2020 that demonstrated spontaneous coronary artery dissection, scad, and small circumflex branch.  She was hypertensive on admission and amlodipine was initiated.  Nitroglycerin was decreased.  Aspirin long-term 81 mg was prescribed and echocardiogram demonstrated normal ejection fraction.  Nifedipine XL 30 mg a day was administered given breast-feeding.  No evidence of atherosclerotic disease on cardiac catheterization.  Dr. Irish Lack following.  Earlier today she started to develop mid to lower sternal area chest discomfort about 2 PM at rest.  There were moderate in intensity with no radiation.  She did feel a similar pain that prompted her recent admission.  No fevers no chills no cough no syncope no abdominal pain no headaches.  No shortness of breath.  EKG today shows sinus rhythm 93 bpm with nonspecific ST-T wave changes, PVC noted.  Troponin today was 37.  Peak troponin during spontaneous coronary artery dissection was 14,000.  This trailed down to 3200 on 3/29.  Currently she is feeling much better.  No further chest pain.  No shortness of breath.  She does admit that she is having some anxiety surrounding her episode.  This is also her anniversary and she was a bit  nervous.  Past Medical History:  Diagnosis Date  . Chronic hypertension 05/18/2019  . History of gestational hypertension 06/15/2020  . Hx of chlamydia infection 03/2016  . Infection    UTI  . Postpartum hypertension 07/09/2016  . Pregnancy induced hypertension    meds PP for 6-8wks  . Uterine fibroid     Past Surgical History:  Procedure Laterality Date  . CARDIAC CATHETERIZATION  06/15/2020  . LEFT HEART CATH AND CORONARY ANGIOGRAPHY N/A 06/15/2020   Procedure: LEFT HEART CATH AND CORONARY ANGIOGRAPHY;  Surgeon: Jettie Booze, MD;  Location: [facility];  Service: Cardiovascular;  Laterality: N/A;  . NO PAST SURGERIES       Home Medications:  Prior to Admission medications   Medication Sig Start Date End Date Taking? Authorizing Provider  aspirin EC 81 MG EC tablet Take 1 tablet (81 mg total) by mouth daily. Swallow whole. 06/17/20  Yes Barb Merino, MD  NIFEdipine (ADALAT CC) 30 MG 24 hr tablet Take 1 tablet (30 mg total) by mouth daily. 06/16/20  Yes Barb Merino, MD  Prenatal Vit-Fe Fumarate-FA (MULTIVITAMIN-PRENATAL) 27-0.8 MG TABS tablet Take 1 tablet by mouth daily at 12 noon.   Yes [provider]  acetaminophen (TYLENOL) 325 MG tablet Take 2 tablets (650 mg total) by mouth every 4 (four) hours as needed (for pain scale < 4). Patient not taking: No sig reported 11/02/17   Aletha Halim, MD    Inpatient Medications: Scheduled Meds: . potassium chloride  40 mEq Oral Once   Continuous Infusions:  PRN Meds:  Allergies:   No Known Allergies  Social History:   Social History   Socioeconomic History  . Marital status: Married    Spouse name: Not on file  . Number of children: 1  . Years of education: Not on file  . Highest education level: Bachelor's degree (e.g., BA, AB, BS)  Occupational History  . Not on file  Tobacco Use  . Smoking status: Current Every Day Smoker    Packs/day: 0.33    Types: Cigarettes  . Smokeless tobacco:  Never Used  Vaping Use  . Vaping Use: Never used  Substance and Sexual Activity  . Alcohol use: Not Currently    Comment: none since pregnancy  . Drug use: No  . Sexual activity: Yes    Birth control/protection: None  Other Topics Concern  . Not on file  Social History Narrative  . Not on file   Social Determinants of Health   Financial Resource Strain: Not on file  Food Insecurity: Not on file  Transportation Needs: Not on file  Physical Activity: Not on file  Stress: Not on file  Social Connections: Not on file  Intimate Partner Violence: Not on file    Family History:    Family History  Problem Relation Age of Onset  . Hypertension Mother   . Lung cancer Father      ROS:  Please see the history of present illness.   All other ROS reviewed and negative.     Physical Exam/Data:   Vitals:   06/28/20 1536 06/28/20 1540  BP: 128/87   Pulse: 99   Resp: 16   Temp: 98.3 F (36.8 C)   TempSrc: Oral   SpO2: 94% 98%   No intake or output data in the 24 hours ending 06/28/20 1804 Last 3 Weights 06/16/2020 06/15/2020 04/09/2020  Weight (lbs) 210 lb 5.1 oz 210 lb 239 lb 6.4 oz  Weight (kg) 95.4 kg 95.255 kg 108.591 kg     There is no height or weight on file to calculate BMI.  General:  Well nourished, well developed, in no acute distress HEENT: normal Lymph: no adenopathy Neck: no JVD Endocrine:  No thryomegaly Vascular: No carotid bruits; FA pulses 2+ bilaterally without bruits  Cardiac:  normal S1, S2; RRR; no murmur  Lungs:  clear to auscultation bilaterally, no wheezing, rhonchi or rales  Abd: soft, nontender, no hepatomegaly  Ext: no edema Musculoskeletal:  No deformities, BUE and BLE strength normal and equal Skin: warm and dry  Neuro:  CNs 2-12 intact, no focal abnormalities noted Psych:  Normal affect   EKG:  The EKG was personally reviewed and demonstrates: As described above Telemetry:  Telemetry was personally reviewed and demonstrates: No adverse  arrhythmias  Relevant CV Studies:   Cardiac cath: 06/15/2020   Dist Cx lesion is 75% stenosed. 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 There also appears to be dissection of the distal OM2. This likely explains the wall motion abnormality.  Distal 3rd Mrg lesion is 75% stenosed. 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  Otherwise, large, widely patent coronary arteries without significant disease.  The left ventricular systolic function is normal.  LV end diastolic pressure is mildly elevated.  The left ventricular ejection fraction is 55-65% by visual estimate. Small area of mid inferior hypokinesis.  There is no aortic valve stenosis.  Spoke with husband about the importance of BP control.  No indication for PCI since she has normal flow. Vessels should heal on its own.   ECHO 06/16/20:   1. Left ventricular ejection fraction, by estimation, is 60 to 65%. The  left ventricle has normal function. The left ventricle has no regional  wall motion abnormalities. Left ventricular diastolic parameters were  normal. There is abnormal (paradoxical)  septal motion, consistent with right ventricular volume overload.  2. Right ventricular systolic function is normal. The right ventricular  size is normal.  3. The mitral valve is normal in structure. No evidence of mitral valve  regurgitation. No evidence of mitral stenosis.  4. The aortic valve is normal in structure. Aortic valve regurgitation is  not visualized. No aortic stenosis is present.  5. The inferior vena cava is normal in size with greater than 50%  respiratory variability, suggesting right atrial pressure of 3 mmHg.   Laboratory Data:  High Sensitivity Troponin:   Recent Labs  Lab 06/14/20 2331 06/15/20 0442 06/15/20 1349 06/15/20 1606 06/28/20 1549  TROPONINIHS 1,806* 14,231* 4,130* 3,285* 37*      Chemistry Recent Labs  Lab 06/28/20 1549  NA 136  K 3.4*  CL 106  CO2 21*  GLUCOSE 95  BUN 10  CREATININE 0.62  CALCIUM 9.2  GFRNONAA >60  ANIONGAP 9    Recent Labs  Lab 06/28/20 1549  PROT 7.2  ALBUMIN 3.9  AST 19  ALT 18  ALKPHOS 94  BILITOT 0.5   Hematology Recent Labs  Lab 06/28/20 1549  WBC 8.5  RBC 4.67  HGB 14.0  HCT 41.7  MCV 89.3  MCH 30.0  MCHC 33.6  RDW 12.2  PLT 292   BNPNo results for input(s): BNP, PROBNP in the last 168 hours.  DDimer No results for input(s): DDIMER in the last 168 hours.   Radiology/Studies:  DG Chest Port 1 View  Result Date: 06/28/2020 CLINICAL DATA:  Chest pain, shortness of breath EXAM: PORTABLE CHEST 1 VIEW COMPARISON:  06/14/2020 FINDINGS: The heart size and mediastinal contours are within normal limits. Both lungs are clear. The visualized skeletal structures are unremarkable. IMPRESSION: Negative. Electronically Signed   By: Rolm Baptise M.D.   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 -Thankfully, troponin value is 37 today which is markedly reduced from 14,000 previously during admission.  EKG shows nonspecific changes, fairly unremarkable. -Given her lack of symptoms currently, I am comfortable with her going home. -She will be seeing Dr. Irish Lack tomorrow in clinic. -She knows that if symptoms become worse or more worrisome that she can always seek medical attention.  Obviously after situations such as spontaneous coronary artery dissection there can be a degree of anxiety and makes it challenging. -Continue with aspirin.   For questions or updates, please contact [place] Please consult [email or web address] for contact info under    Signed, Candee Furbish, MD  06/28/2020 6:04 PM

## 2020-06-28 NOTE — ED Notes (Signed)
DC  Instructions reviewed with the pt.  Pt verbalized understanding.  Pt DC.

## 2020-06-28 NOTE — ED Provider Notes (Signed)
[facility] EMERGENCY DEPARTMENT Provider Note   CSN: [id number] Arrival date & time: 06/28/20  1534     History Chief Complaint  Patient presents with  . Chest Pain    Anita Potter is a 35 y.o. female.  Patient c/o mid to lower sternal area chest pain onset around 2 pm today at rest. Symptoms acute onset, moderate, dull, constant, non radiating. No associated sob, nv or diaphoresis. No pleuritic pain. Indicates is similar to chest pain that prompted recent admission - was found to have coronary dissection. Denies cough or uri symptoms. No fever or chills. No abd pain. No back or neck pain. Denies chest wall injury or strain. No heartburn. No leg pain or swelling. No hx dvt or pe. Is taking asa q day.   The history is provided by the patient and the EMS personnel.  Chest Pain Associated symptoms: no abdominal pain, no back pain, no cough, no fever, no headache, no nausea, no palpitations, no shortness of breath and no vomiting        Past Medical History:  Diagnosis Date  . Chronic hypertension 05/18/2019  . History of gestational hypertension 06/15/2020  . Hx of chlamydia infection 03/2016  . Infection    UTI  . Postpartum hypertension 07/09/2016  . Pregnancy induced hypertension    meds PP for 6-8wks  . Uterine fibroid     Patient Active Problem List   Diagnosis Date Noted  . NSTEMI (non-ST elevated myocardial infarction) ([place]) 06/15/2020  . History of gestational hypertension 06/15/2020  . Nicotine dependence, cigarettes, uncomplicated 30/86/5784  . Chest pain 06/15/2020  . Elevated troponin   . Spontaneous dissection of coronary artery   . Encounter for induction of labor 04/09/2020  . SVD (spontaneous vaginal delivery) 04/09/2020  . Essential hypertension 05/18/2019  . Hypertension in pregnancy, antepartum 10/31/2017  . Obesity (BMI 35.0-39.9 without comorbidity) 04/25/2017  . Vertigo 08/15/2013    Past Surgical History:  Procedure  Laterality Date  . CARDIAC CATHETERIZATION  06/15/2020  . LEFT HEART CATH AND CORONARY ANGIOGRAPHY N/A 06/15/2020   Procedure: LEFT HEART CATH AND CORONARY ANGIOGRAPHY;  Surgeon: Jettie Booze, MD;  Location: [facility];  Service: Cardiovascular;  Laterality: N/A;  . NO PAST SURGERIES       OB History    Gravida  4   Para  3   Term  3   Preterm      AB  1   Living  3     SAB  1   IAB      Ectopic      Multiple  0   Live Births  3           Family History  Problem Relation Age of Onset  . Hypertension Mother   . Lung cancer Father     Social History   Tobacco Use  . Smoking status: Current Every Day Smoker    Packs/day: 0.33    Types: Cigarettes  . Smokeless tobacco: Never Used  Vaping Use  . Vaping Use: Never used  Substance Use Topics  . Alcohol use: Not Currently    Comment: none since pregnancy  . Drug use: No    Home Medications Prior to Admission medications   Medication Sig Start Date End Date Taking? Authorizing Provider  acetaminophen (TYLENOL) 325 MG tablet Take 2 tablets (650 mg total) by mouth every 4 (four) hours as needed (for pain scale < 4). Patient not taking: Reported  on 06/15/2020 11/02/17   Aletha Halim, MD  aspirin EC 81 MG EC tablet Take 1 tablet (81 mg total) by mouth daily. Swallow whole. 06/17/20   Barb Merino, MD  NIFEdipine (ADALAT CC) 30 MG 24 hr tablet Take 1 tablet (30 mg total) by mouth daily. 06/16/20   Barb Merino, MD  Prenatal Vit-Fe Fumarate-FA (MULTIVITAMIN-PRENATAL) 27-0.8 MG TABS tablet Take 1 tablet by mouth daily at 12 noon.    [provider]    Allergies    Patient has no known allergies.  Review of Systems   Review of Systems  Constitutional: Negative for chills and fever.  HENT: Negative for sore throat.   Eyes: Negative for redness.  Respiratory: Negative for cough and shortness of breath.   Cardiovascular: Positive for chest pain. Negative for palpitations and leg  swelling.  Gastrointestinal: Negative for abdominal pain, nausea and vomiting.  Genitourinary: Negative for flank pain.  Musculoskeletal: Negative for back pain and neck pain.  Skin: Negative for rash.  Neurological: Negative for headaches.  Hematological: Does not bruise/bleed easily.  Psychiatric/Behavioral: Negative for confusion.    Physical Exam Updated Vital Signs BP 128/87 (BP Location: Right Arm)   Pulse 99   Temp 98.3 F (36.8 C) (Oral)   Resp 16   LMP 06/09/2020   SpO2 98%   Physical Exam Vitals and nursing note reviewed.  Constitutional:      Appearance: Normal appearance. She is well-developed.  HENT:     Head: Atraumatic.     Nose: Nose normal.     Mouth/Throat:     Mouth: Mucous membranes are moist.  Eyes:     General: No scleral icterus.    Conjunctiva/sclera: Conjunctivae normal.  Neck:     Trachea: No tracheal deviation.  Cardiovascular:     Rate and Rhythm: Normal rate and regular rhythm.     Pulses: Normal pulses.     Heart sounds: Normal heart sounds. No murmur heard. No friction rub. No gallop.   Pulmonary:     Effort: Pulmonary effort is normal. No respiratory distress.     Breath sounds: Normal breath sounds.  Chest:     Chest wall: No tenderness.  Abdominal:     General: Bowel sounds are normal. There is no distension.     Palpations: Abdomen is soft.     Tenderness: There is no abdominal tenderness.  Genitourinary:    Comments: No cva tenderness.  Musculoskeletal:        General: No swelling or tenderness.     Cervical back: Normal range of motion and neck supple. No rigidity. No muscular tenderness.     Right lower leg: No edema.     Left lower leg: No edema.  Skin:    General: Skin is warm and dry.     Findings: No rash.  Neurological:     Mental Status: She is alert.     Comments: Alert, speech normal.   Psychiatric:        Mood and Affect: Mood normal.     ED Results / Procedures / Treatments   Labs (all labs ordered are  listed, but only abnormal results are displayed) Results for orders placed or performed during the hospital encounter of 06/28/20  CBC  Result Value Ref Range   WBC 8.5 4.0 - 10.5 K/uL   RBC 4.67 3.87 - 5.11 MIL/uL   Hemoglobin 14.0 12.0 - 15.0 g/dL   HCT 41.7 36.0 - 46.0 %   MCV 89.3 80.0 -  100.0 fL   MCH 30.0 26.0 - 34.0 pg   MCHC 33.6 30.0 - 36.0 g/dL   RDW 12.2 11.5 - 15.5 %   Platelets 292 150 - 400 K/uL   nRBC 0.0 0.0 - 0.2 %  Comprehensive metabolic panel  Result Value Ref Range   Sodium 136 135 - 145 mmol/L   Potassium 3.4 (L) 3.5 - 5.1 mmol/L   Chloride 106 98 - 111 mmol/L   CO2 21 (L) 22 - 32 mmol/L   Glucose, Bld 95 70 - 99 mg/dL   BUN 10 6 - 20 mg/dL   Creatinine, Ser 0.62 0.44 - 1.00 mg/dL   Calcium 9.2 8.9 - 10.3 mg/dL   Total Protein 7.2 6.5 - 8.1 g/dL   Albumin 3.9 3.5 - 5.0 g/dL   AST 19 15 - 41 U/L   ALT 18 0 - 44 U/L   Alkaline Phosphatase 94 38 - 126 U/L   Total Bilirubin 0.5 0.3 - 1.2 mg/dL   GFR, Estimated >60 >60 mL/min   Anion gap 9 5 - 15  Troponin I (High Sensitivity)  Result Value Ref Range   Troponin I (High Sensitivity) 37 (H) <18 ng/L   CT Angio Chest PE W and/or Wo Contrast  Result Date: 06/14/2020 CLINICAL DATA:  Nausea and chest pain after nursing sign EXAM: CT ANGIOGRAPHY CHEST WITH CONTRAST TECHNIQUE: Multidetector CT imaging of the chest was performed using the standard protocol during bolus administration of intravenous contrast. Multiplanar CT image reconstructions and MIPs were obtained to evaluate the vascular anatomy. CONTRAST:  75 mL Omnipaque 350 IV COMPARISON:  Radiograph 06/14/2020 FINDINGS: Cardiovascular: Satisfactory opacification the pulmonary arteries to the segmental level. No pulmonary artery filling defects are identified. Central pulmonary arteries are normal caliber. Normal heart size. No sizable pericardial effusion with a trace amount of fluid in the pericardial recesses within physiologic normal. Suboptimal opacification  of the thoracic aorta for luminal assessment. No gross abnormality of the normal caliber thoracic aorta and normally branching proximal great vessels. Slight reflux of contrast into the IVC and hepatic veins. No other major venous abnormality. Mediastinum/Nodes: No mediastinal fluid or gas. Normal thyroid gland and thoracic inlet. No acute abnormality of the trachea or esophagus. No worrisome mediastinal, hilar or axillary adenopathy. Lungs/Pleura: No consolidation, features of edema, pneumothorax, or effusion. No suspicious pulmonary nodules or masses. Upper Abdomen: No acute abnormalities present in the visualized portions of the upper abdomen. Musculoskeletal: No acute osseous abnormality or suspicious osseous lesion. No worrisome chest wall masses or lesions. Prominence of the glandular tissue in the breast compatible with lactation related changes. Review of the MIP images confirms the above findings. IMPRESSION: 1. No evidence of pulmonary embolism. 2. Slight reflux of contrast into the IVC and hepatic veins. Finding is nonspecific though can be seen with elevated right heart pressures. 3. No other acute intrathoracic process. Electronically Signed   By: Lovena Le M.D.   On: 06/14/2020 23:29   CARDIAC CATHETERIZATION  Result Date: 06/15/2020  Dist Cx lesion is 75% stenosed. 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 There also appears to be dissection of the distal OM2. This likely explains the wall motion abnormality.  Distal 3rd Mrg lesion is 75% stenosed. 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  Otherwise, large, widely patent coronary arteries without significant disease.  The left ventricular systolic function is normal.  LV end diastolic pressure is  mildly elevated.  The left ventricular ejection fraction is 55-65% by visual estimate. Small area of mid inferior hypokinesis.  There is no  aortic valve stenosis.  Spoke with husband about the importance of BP control.   No indication for PCI since she has normal flow.  Vessels should heal on its own.   DG Chest Portable 1 View  Result Date: 06/14/2020 CLINICAL DATA:  Chest pain. EXAM: PORTABLE CHEST 1 VIEW COMPARISON:  Radiograph 06/07/2007 FINDINGS: The cardiomediastinal contours are normal. The lungs are clear. Pulmonary vasculature is normal. No consolidation, pleural effusion, or pneumothorax. No acute osseous abnormalities are seen. IMPRESSION: Negative AP view of the chest. Electronically Signed   By: Keith Rake M.D.   On: 06/14/2020 21:48   ECHOCARDIOGRAM COMPLETE  Result Date: 06/16/2020    ECHOCARDIOGRAM REPORT   Patient Name:   Ervin Muellner Date of Exam: 06/16/2020 Medical Rec #:  [id number]         Height:       65.0 in Accession #:    [id number]        Weight:       210.3 lb Date of Birth:  11/03/1985         BSA:          2.021 m Patient Age:    70 years          BP:           139/89 mmHg Patient Gender: F                 HR:           73 bpm. Exam Location:  Inpatient Procedure: 2D Echo, Cardiac Doppler and Color Doppler Indications:    Chest Pain R07.9  History:        Patient has no prior history of Echocardiogram examinations.                 Risk Factors:Hypertension and Current Smoker.  Sonographer:    Vickie Epley RDCS Referring Phys: [id number] [place]  1. Left ventricular ejection fraction, by estimation, is 60 to 65%. The left ventricle has normal function. The left ventricle has no regional wall motion abnormalities. Left ventricular diastolic parameters were normal. There is abnormal (paradoxical) septal motion, consistent with right ventricular volume overload.  2. Right ventricular systolic function is normal. The right ventricular size is normal.  3. The mitral valve is normal in structure. No evidence of mitral valve regurgitation. No evidence of mitral stenosis.  4. The aortic valve is  normal in structure. Aortic valve regurgitation is not visualized. No aortic stenosis is present.  5. The inferior vena cava is normal in size with greater than 50% respiratory variability, suggesting right atrial pressure of 3 mmHg. FINDINGS  Left Ventricle: Left ventricular ejection fraction, by estimation, is 60 to 65%. The left ventricle has normal function. The left ventricle has no regional wall motion abnormalities. Global longitudinal strain performed but not reported based on interpreter judgement due to suboptimal tracking. 3D left ventricular ejection fraction analysis performed but not reported based on interpreter judgement due to suboptimal quality. The left ventricular internal cavity size was normal in size. There is no left ventricular hypertrophy. Abnormal (paradoxical) septal motion, consistent with right ventricular volume overload. Left ventricular diastolic parameters were normal. Right Ventricle: The right ventricular size is normal. No increase in right ventricular wall thickness. Right ventricular systolic function is normal. Left Atrium: Left atrial size was normal in  size. Right Atrium: Right atrial size was normal in size. Pericardium: There is no evidence of pericardial effusion. Mitral Valve: The mitral valve is normal in structure. No evidence of mitral valve regurgitation. No evidence of mitral valve stenosis. Tricuspid Valve: The tricuspid valve is normal in structure. Tricuspid valve regurgitation is mild . No evidence of tricuspid stenosis. Aortic Valve: The aortic valve is normal in structure. Aortic valve regurgitation is not visualized. No aortic stenosis is present. Pulmonic Valve: The pulmonic valve was normal in structure. Pulmonic valve regurgitation is not visualized. No evidence of pulmonic stenosis. Aorta: The aortic root is normal in size and structure. Venous: The inferior vena cava is normal in size with greater than 50% respiratory variability, suggesting right atrial  pressure of 3 mmHg. IAS/Shunts: No atrial level shunt detected by color flow Doppler.  LEFT VENTRICLE PLAX 2D LVIDd:         4.60 cm      Diastology LVIDs:         3.20 cm      LV e' medial:    8.59 cm/s LV PW:         0.70 cm      LV E/e' medial:  7.9 LV IVS:        0.70 cm      LV e' lateral:   12.30 cm/s LVOT diam:     2.10 cm      LV E/e' lateral: 5.5 LV SV:         71 LV SV Index:   35 LVOT Area:     3.46 cm  LV Volumes (MOD) LV vol d, MOD A2C: 137.0 ml LV vol d, MOD A4C: 136.0 ml LV vol s, MOD A2C: 52.8 ml LV vol s, MOD A4C: 62.3 ml LV SV MOD A2C:     84.2 ml LV SV MOD A4C:     136.0 ml LV SV MOD BP:      81.9 ml RIGHT VENTRICLE RV S prime:     11.50 cm/s TAPSE (M-mode): 2.2 cm LEFT ATRIUM             Index       RIGHT ATRIUM           Index LA diam:        3.20 cm 1.58 cm/m  RA Area:     11.80 cm LA Vol (A2C):   31.5 ml 15.59 ml/m RA Volume:   23.50 ml  11.63 ml/m LA Vol (A4C):   40.2 ml 19.89 ml/m LA Biplane Vol: 39.5 ml 19.55 ml/m  AORTIC VALVE LVOT Vmax:   96.00 cm/s LVOT Vmean:  72.700 cm/s LVOT VTI:    0.204 m  AORTA Ao Root diam: 2.70 cm MITRAL VALVE               TRICUSPID VALVE MV Area (PHT): 2.99 cm    TR Peak grad:   18.5 mmHg MV Decel Time: 254 msec    TR Vmax:        215.00 cm/s MV E velocity: 67.70 cm/s MV A velocity: 53.10 cm/s  SHUNTS MV E/A ratio:  1.27        Systemic VTI:  0.20 m                            Systemic Diam: 2.10 cm Jenkins Rouge MD Electronically signed by Jenkins Rouge MD Signature Date/Time: 06/16/2020/11:28:05 AM    Final  EKG EKG Interpretation  Date/Time:  Monday June 28 2020 15:37:04 EDT Ventricular Rate:  93 PR Interval:  188 QRS Duration: 73 QT Interval:  344 QTC Calculation: 428 R Axis:   47 Text Interpretation: Sinus rhythm Ventricular premature complex Nonspecific T wave abnormality Confirmed by Lajean Saver [phone]) on 06/28/2020 3:41:53 PM   Radiology DG Chest Port 1 View  Result Date: 06/28/2020 CLINICAL DATA:  Chest pain, shortness of  breath EXAM: PORTABLE CHEST 1 VIEW COMPARISON:  06/14/2020 FINDINGS: The heart size and mediastinal contours are within normal limits. Both lungs are clear. The visualized skeletal structures are unremarkable. IMPRESSION: Negative. Electronically Signed   By: Rolm Baptise M.D.   On: 06/28/2020 16:19    Procedures Procedures   Medications Ordered in ED Medications - No data to display  ED Course  I have reviewed the triage vital signs and the nursing notes.  Pertinent labs & imaging results that were available during my care of the patient were reviewed by me and considered in my medical decision making (see chart for details).    MDM Rules/Calculators/A&P                          Iv ns. Stat labs and imaging.   Reviewed nursing notes and prior charts for additional history.   Labs reviewed/interpreted by me - k sl low. Trop decreased as compared to prior visit.   CXR reviewed/interpreted by me - no pna.   Will consult cardiology. Dr Marlou Porch came to evaluate in ED, reviewed labs, ecg, etc, and indicates d/c to home, and that patient already has f/u tomorrow arranged with Dr Irish Lack:    Recheck pt, denies any chest pain or discomfort. No sob. Pt indicates she feels ready for d/c and will f/u tomorrow as arranged.     Final Clinical Impression(s) / ED Diagnoses Final diagnoses:  None    Rx / DC Orders ED Discharge Orders    None       Lajean Saver, MD 06/28/20 1843

## 2020-06-28 NOTE — Discharge Instructions (Signed)
It was our pleasure to provide your ER care today - we hope that you feel better.  Our cardiologist evaluated you in the ER today, reviewed your tests, and recommends that you follow up with them in the office tomorrow as previously arranged.   From today's labs, your potassium level is slightly low (3.4) - eat plenty of fruits and vegetables, and follow up with primary care doctor in 1-2 weeks.   Return to ER right away if worse, new symptoms, recurrent or persistent chest pain, trouble breathing, persistent fast heart beat, weak/fainting, or other concern.

## 2020-06-28 NOTE — ED Triage Notes (Signed)
PT BIB [facility] for sudden onset of CP with weakness and lightheadedness when standing. Initial HR was 140 resolving to 100 on arrival.  PT experienced numbness and tingling in her arms and appeared anxious on scene.  THis has resolved on arrivAL. Pt is 2 moths post partum with recent hx of NSTEMI approx 2 wks ago according to EMS.

## 2020-06-29 ENCOUNTER — Ambulatory Visit (INDEPENDENT_AMBULATORY_CARE_PROVIDER_SITE_OTHER): Payer: BC Managed Care – PPO

## 2020-06-29 ENCOUNTER — Encounter: Payer: Self-pay | Admitting: *Deleted

## 2020-06-29 ENCOUNTER — Encounter: Payer: Self-pay | Admitting: Interventional Cardiology

## 2020-06-29 ENCOUNTER — Ambulatory Visit (INDEPENDENT_AMBULATORY_CARE_PROVIDER_SITE_OTHER): Payer: BC Managed Care – PPO | Admitting: Interventional Cardiology

## 2020-06-29 VITALS — BP 128/72 | HR 100 | Ht 65.0 in | Wt 203.6 lb

## 2020-06-29 DIAGNOSIS — I10 Essential (primary) hypertension: Secondary | ICD-10-CM

## 2020-06-29 DIAGNOSIS — I2542 Coronary artery dissection: Secondary | ICD-10-CM | POA: Diagnosis not present

## 2020-06-29 DIAGNOSIS — R002 Palpitations: Secondary | ICD-10-CM

## 2020-06-29 NOTE — Patient Instructions (Addendum)
Medication Instructions:  Your physician recommends that you continue on your current medications as directed. Please refer to the Current Medication list given to you today.  *If you need a refill on your cardiac medications before your next appointment, please call your pharmacy*   Lab Work: none If you have labs (blood work) drawn today and your tests are completely normal, you will receive your results only by: Marland Kitchen MyChart Message (if you have MyChart) OR . A paper copy in the mail If you have any lab test that is abnormal or we need to change your treatment, we will call you to review the results.   Testing/Procedures: Your physician has recommended that you wear a holter monitor. Holter monitors are medical devices that record the heart's electrical activity. Doctors most often use these monitors to diagnose arrhythmias. Arrhythmias are problems with the speed or rhythm of the heartbeat. The monitor is a small, portable device. You can wear one while you do your normal daily activities. This is usually used to diagnose what is causing palpitations/syncope (passing out).  14 day zio   Follow-Up: At [facility], you and your health needs are our priority.  As part of our continuing mission to provide you with exceptional heart care, we have created designated Provider Care Teams.  These Care Teams include your primary Cardiologist (physician) and Advanced Practice Providers (APPs -  Physician Assistants and Nurse Practitioners) who all work together to provide you with the care you need, when you need it.  We recommend signing up for the patient portal called "MyChart".  Sign up information is provided on this After Visit Summary.  MyChart is used to connect with patients for Virtual Visits (Telemedicine).  Patients are able to view lab/test results, encounter notes, upcoming appointments, etc.  Non-urgent messages can be sent to your provider as well.   To learn more about what you can  do with MyChart, go to [email or web address].    Your next appointment:    June 21,2022 at 8:40  The format for your next appointment:   In Person  Provider:   Casandra Doffing, MD   Other Instructions  Bryn Gulling- Long Term Monitor Instructions   Your physician has requested you wear your ZIO patch monitor__14_____days.   This is a single patch monitor.  [employer] supplies one patch monitor per enrollment.  Additional stickers are not available.   Please do not apply patch if you will be having a Nuclear Stress Test, Echocardiogram, Cardiac CT, MRI, or Chest Xray during the time frame you would be wearing the monitor. The patch cannot be worn during these tests.  You cannot remove and re-apply the ZIO XT patch monitor.   Your ZIO patch monitor will be sent USPS Priority mail from [facility] directly to your home address. The monitor may also be mailed to a PO BOX if home delivery is not available.   It may take 3-5 days to receive your monitor after you have been enrolled.   Once you have received you monitor, please review enclosed instructions.  Your monitor has already been registered assigning a specific monitor serial # to you.   Applying the monitor   Shave hair from upper left chest.   Hold abrader disc by orange tab.  Rub abrader in 40 strokes over left upper chest as indicated in your monitor instructions.   Clean area with 4 enclosed alcohol pads .  Use all pads to assure are is cleaned thoroughly.  Let dry.  Apply patch as indicated in monitor instructions.  Patch will be place under collarbone on left side of chest with arrow pointing upward.   Rub patch adhesive wings for 2 minutes.Remove white label marked "1".  Remove white label marked "2".  Rub patch adhesive wings for 2 additional minutes.   While looking in a mirror, press and release button in center of patch.  A small green light will flash 3-4 times .  This will be your only indicator the monitor has  been turned on.     Do not shower for the first 24 hours.  You may shower after the first 24 hours.   Press button if you feel a symptom. You will hear a small click.  Record Date, Time and Symptom in the Patient Log Book.   When you are ready to remove patch, follow instructions on last 2 pages of Patient Log Book.  Stick patch monitor onto last page of Patient Log Book.   Place Patient Log Book in [place] box.  Use locking tab on box and tape box closed securely.  The [employer] and [employer] has [employer] on it.  Please place in mailbox as soon as possible.  Your physician should have your test results approximately 7 days after the monitor has been mailed back to [facility].   Call [place] at [phone] if you have questions regarding your ZIO XT patch monitor.  Call them immediately if you see an orange light blinking on your monitor.   If your monitor falls off in less than 4 days contact our Monitor department at [phone].  If your monitor becomes loose or falls off after 4 days call [employer] at [phone] for suggestions on securing your monitor.

## 2020-06-29 NOTE — Progress Notes (Signed)
Patient ID: Anita Potter, female   DOB: 09/26/1985, 35 y.o.   MRN: [id number] Patient enrolled for [employer] to ship a 14 day ZIO XT monitor to her home.

## 2020-07-02 DIAGNOSIS — R002 Palpitations: Secondary | ICD-10-CM | POA: Diagnosis not present

## 2020-08-05 ENCOUNTER — Encounter (HOSPITAL_COMMUNITY): Payer: Self-pay

## 2020-08-06 ENCOUNTER — Telehealth (HOSPITAL_COMMUNITY): Payer: Self-pay

## 2020-08-06 NOTE — Telephone Encounter (Signed)
Pt returned CR phone call and stated she is interested in CR. Patient will come in for orientation on 08/24/20 @ 10:30AM and will attend the 11AM exercise class.  Tourist information centre manager.

## 2020-08-06 NOTE — Telephone Encounter (Signed)
**  UPDATED**  Pt insurance is active and benefits verified through [place]. Co-pay $0.00, DED $3,000.00/$710.45 met, out of pocket $4,750.00/$698.82 met, co-insurance 20%. No pre-authorization required. [employer], 08/06/20 @ 11:29AM, NID#[id number]

## 2020-08-23 ENCOUNTER — Telehealth (HOSPITAL_COMMUNITY): Payer: Self-pay | Admitting: Pharmacist

## 2020-08-24 ENCOUNTER — Other Ambulatory Visit: Payer: Self-pay

## 2020-08-24 ENCOUNTER — Encounter (HOSPITAL_COMMUNITY)
Admission: RE | Admit: 2020-08-24 | Discharge: 2020-08-24 | Disposition: A | Discharge status: Home / Self Care | Payer: BC Managed Care – PPO | Source: Ambulatory Visit | Attending: Interventional Cardiology | Admitting: Interventional Cardiology

## 2020-08-24 ENCOUNTER — Encounter (HOSPITAL_COMMUNITY): Payer: Self-pay

## 2020-08-24 VITALS — BP 112/64 | HR 92 | Ht 66.5 in | Wt 201.5 lb

## 2020-08-24 DIAGNOSIS — I2542 Coronary artery dissection: Secondary | ICD-10-CM | POA: Insufficient documentation

## 2020-08-24 DIAGNOSIS — I214 Non-ST elevation (NSTEMI) myocardial infarction: Secondary | ICD-10-CM | POA: Insufficient documentation

## 2020-08-24 NOTE — Progress Notes (Signed)
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  acetaminophen (TYLENOL) 325 MG tablet, Take 2 tablets (650 mg total) by mouth every 4 (four) hours as needed (for pain scale < 4)., Disp: 30 tablet, Rfl:  .  ALPRAZolam (XANAX) 0.5 MG tablet, Take 0.5 mg by mouth 3 (three) times daily as needed., Disp: , Rfl:  .  aspirin EC 81 MG EC tablet, Take 1 tablet (81 mg total) by mouth daily. Swallow whole., Disp: 30 tablet, Rfl: 11 .  NIFEdipine (ADALAT CC) 30 MG 24 hr tablet, Take 1 tablet (30 mg total) by mouth daily., Disp: 30 tablet, Rfl: 2 .  Prenatal Vit-Fe Fumarate-FA (MULTIVITAMIN-PRENATAL) 27-0.8 MG TABS tablet, Take 1 tablet by mouth daily at 12 noon., Disp: , Rfl:  .  sertraline (ZOLOFT) 100 MG tablet, Take 1 tablet by mouth daily., Disp: , Rfl:   Past Medical History: Past Medical History:  Diagnosis Date  . Chronic hypertension 05/18/2019  . History of gestational hypertension 06/15/2020  . Hx of chlamydia infection 03/2016  . Infection    UTI  . Postpartum hypertension 07/09/2016  . Pregnancy induced hypertension    meds PP for 6-8wks  . Uterine fibroid     Tobacco Use: Social History   Tobacco Use  Smoking Status Current Every Day Smoker  . Packs/day: 0.33  . Types: Cigarettes  Smokeless Tobacco Never Used  Tobacco Comment   Patient given phone number for 1-800-quit-now    Labs: Recent Review Flowsheet  Data    Labs for ITP Cardiac and Pulmonary Rehab Latest Ref Rng & Units 09/22/2012 06/15/2020   Cholestrol 0 - 200 mg/dL - 182   LDLCALC 0 - 99 mg/dL - 126(H)   HDL >40 mg/dL - 41   Trlycerides <150 mg/dL - 75   Hemoglobin A1c 4.8 - 5.6 % - 5.5   TCO2 0 - 100 mmol/L 26 -      Capillary Blood Glucose: No results found for: GLUCAP   Exercise Target Goals: Exercise Program Goal: Individual exercise prescription set using results from initial 6 min walk test and THRR while considering  patient's activity barriers and safety.   Exercise Prescription Goal: Starting with aerobic activity 30 plus minutes a day, 3 days per week for initial exercise prescription. Provide home exercise prescription and guidelines that participant acknowledges understanding prior to discharge.  Activity Barriers & Risk Stratification:  Activity Barriers & Cardiac Risk Stratification - 08/24/20 1100      Activity Barriers & Cardiac Risk Stratification   Activity Barriers None    Cardiac Risk Stratification High           6 Minute Walk:  6 Minute Walk    Row Name 08/24/20 1110         6 Minute Walk   Phase Initial     Distance 1400 feet     Walk Time 6 minutes     # of Rest Breaks 0  MPH 2.65     METS 4.95     RPE 9     Perceived Dyspnea  0     VO2 Peak 17.34     Symptoms No     Resting HR 92 bpm     Resting BP 112/64     Resting Oxygen Saturation  99 %     Exercise Oxygen Saturation  during 6 min walk 97 %     Max Ex. HR 116 bpm     Max Ex. BP 128/82     2 Minute Post BP 122/76            Oxygen Initial Assessment:   Oxygen Re-Evaluation:   Oxygen Discharge (Final Oxygen Re-Evaluation):   Initial Exercise Prescription:  Initial Exercise Prescription - 08/24/20 1100      Date of Initial Exercise RX and Referring Provider   Date 08/24/20    Referring Provider Jettie Booze, MD    Expected Discharge Date 10/22/20      Treadmill   MPH 2.5    Grade 0    Minutes  15    METs 2.91      NuStep   Level 3    SPM 85    Minutes 15    METs 2.5      Prescription Details   Frequency (times per week) 3    Duration Progress to 30 minutes of continuous aerobic without signs/symptoms of physical distress      Intensity   THRR 40-80% of Max Heartrate 75-150    Ratings of Perceived Exertion 11-13    Perceived Dyspnea 0-4      Progression   Progression Continue to progress workloads to maintain intensity without signs/symptoms of physical distress.      Resistance Training   Training Prescription Yes    Weight 3 lbs    Reps 10-15           Perform Capillary Blood Glucose checks as needed.  Exercise Prescription Changes:   Exercise Comments:   Exercise Goals and Review:  Exercise Goals    Row Name 08/24/20 1032             Exercise Goals   Increase Physical Activity Yes       Intervention Provide advice, education, support and counseling about physical activity/exercise needs.;Develop an individualized exercise prescription for aerobic and resistive training based on initial evaluation findings, risk stratification, comorbidities and participant's personal goals.       Expected Outcomes Short Term: Attend rehab on a regular basis to increase amount of physical activity.;Long Term: Exercising regularly at least 3-5 days a week.;Long Term: Add in home exercise to make exercise part of routine and to increase amount of physical activity.       Increase Strength and Stamina Yes       Intervention Provide advice, education, support and counseling about physical activity/exercise needs.;Develop an individualized exercise prescription for aerobic and resistive training based on initial evaluation findings, risk stratification, comorbidities and participant's personal goals.       Expected Outcomes Short Term: Increase workloads from initial exercise prescription for resistance, speed, and METs.;Short Term: Perform resistance training exercises  routinely during rehab and add in resistance training at home;Long Term: Improve cardiorespiratory fitness, muscular endurance and strength as measured by increased METs and functional capacity (6MWT)       Able to understand and use rate of perceived exertion (RPE) scale Yes       Intervention Provide  education and explanation on how to use RPE scale       Expected Outcomes Short Term: Able to use RPE daily in rehab to express subjective intensity level;Long Term:  Able to use RPE to guide intensity level when exercising independently       Knowledge and understanding of Target Heart Rate Range (THRR) Yes       Intervention Provide education and explanation of THRR including how the numbers were predicted and where they are located for reference       Expected Outcomes Short Term: Able to state/look up THRR;Long Term: Able to use THRR to govern intensity when exercising independently;Short Term: Able to use daily as guideline for intensity in rehab       Able to check pulse independently Yes       Intervention Provide education and demonstration on how to check pulse in carotid and radial arteries.;Review the importance of being able to check your own pulse for safety during independent exercise       Expected Outcomes Short Term: Able to explain why pulse checking is important during independent exercise;Long Term: Able to check pulse independently and accurately       Understanding of Exercise Prescription Yes       Intervention Provide education, explanation, and written materials on patient's individual exercise prescription       Expected Outcomes Short Term: Able to explain program exercise prescription;Long Term: Able to explain home exercise prescription to exercise independently              Exercise Goals Re-Evaluation :    Discharge Exercise Prescription (Final Exercise Prescription Changes):   Nutrition:  Target Goals: Understanding of nutrition guidelines, daily intake of  sodium 1500mg , cholesterol 200mg , calories 30% from fat and 7% or less from saturated fats, daily to have 5 or more servings of fruits and vegetables.  Biometrics:  Pre Biometrics - 08/24/20 1030      Pre Biometrics   Waist Circumference 42 inches    Hip Circumference 46.5 inches    Waist to Hip Ratio 0.9 %    Triceps Skinfold 38 mm    % Body Fat 42.4 %    Grip Strength 39 kg    Flexibility 20 in    Single Leg Stand 30 seconds            Nutrition Therapy Plan and Nutrition Goals:   Nutrition Assessments:  MEDIFICTS Score Key:  ?70 Need to make dietary changes   40-70 Heart Healthy Diet  ? 40 Therapeutic Level Cholesterol Diet   Picture Your Plate Scores:  <42 Unhealthy dietary pattern with much room for improvement.  41-50 Dietary pattern unlikely to meet recommendations for good health and room for improvement.  51-60 More healthful dietary pattern, with some room for improvement.   >60 Healthy dietary pattern, although there may be some specific behaviors that could be improved.    Nutrition Goals Re-Evaluation:   Nutrition Goals Discharge (Final Nutrition Goals Re-Evaluation):   Psychosocial: Target Goals: Acknowledge presence or absence of significant depression and/or stress, maximize coping skills, provide positive support system. Participant is able to verbalize types and ability to use techniques and skills needed for reducing stress and depression.  Initial Review & Psychosocial Screening:  Initial Psych Review & Screening - 08/24/20 1200      Initial Review   Current issues with Current Stress Concerns;Current Depression;Current Anxiety/Panic    Source of Stress Concerns Chronic Illness;Financial;Unable to perform yard/household activities;Unable to  participate in former interests or hobbies;Family    Comments Etter has experienced a lot of stress, anxiety and reports having panic attacks. Rodney is currently on an anitdepressant and is  seeing a Social worker. Grettel has not had a a panic attack in over a month.      Family Dynamics   Good Support System? Yes   Erin Fulling has her husband, mother in law who is currently living with her and has a mother in law who lives near by     Barriers   Psychosocial barriers to participate in program The patient should benefit from training in stress management and relaxation.;Psychosocial barriers identified (see note)      Screening Interventions   Interventions Encouraged to exercise;To provide support and resources with identified psychosocial needs;Provide feedback about the scores to participant    Expected Outcomes Short Term goal: Identification and review with participant of any Quality of Life or Depression concerns found by scoring the questionnaire.;Long Term Goal: Stressors or current issues are controlled or eliminated.;Short Term goal: Utilizing psychosocial counselor, staff and physician to assist with identification of specific Stressors or current issues interfering with healing process. Setting desired goal for each stressor or current issue identified.;Long Term goal: The participant improves quality of Life and PHQ9 Scores as seen by post scores and/or verbalization of changes           Quality of Life Scores:  Quality of Life - 08/24/20 1152      Quality of Life   Select Quality of Life      Quality of Life Scores   Health/Function Pre 14.83 %    Socioeconomic Pre 21.29 %    Psych/Spiritual Pre 19.64 %    Family Pre 24 %    GLOBAL Pre 18.5 %          Scores of 19 and below usually indicate a poorer quality of life in these areas.  A difference of  2-3 points is a clinically meaningful difference.  A difference of 2-3 points in the total score of the Quality of Life Index has been associated with significant improvement in overall quality of life, self-image, physical symptoms, and general health in studies assessing change in quality of life.  PHQ-9: Recent  Review Flowsheet Data    Depression screen [facility] 2/9 08/24/2020 10/30/2017 10/16/2017 09/18/2017 08/08/2017   Decreased Interest 0 0 1 1 0   Down, Depressed, Hopeless 1 0 0 0 0   PHQ - 2 Score 1 0 1 1 0   Altered sleeping - 0 2 1 0   Tired, decreased energy - 0 2 1 0   Change in appetite - 0 0 0 0   Feeling bad or failure about yourself  - 0 0 0 0   Trouble concentrating - 0 0 0 0   Moving slowly or fidgety/restless - 0 0 0 0   Suicidal thoughts - 0 0 0 0   PHQ-9 Score - 0 5 3 0     Interpretation of Total Score  Total Score Depression Severity:  1-4 = Minimal depression, 5-9 = Mild depression, 10-14 = Moderate depression, 15-19 = Moderately severe depression, 20-27 = Severe depression   Psychosocial Evaluation and Intervention:   Psychosocial Re-Evaluation:   Psychosocial Discharge (Final Psychosocial Re-Evaluation):   Vocational Rehabilitation: Provide vocational rehab assistance to qualifying candidates.   Vocational Rehab Evaluation & Intervention:  Vocational Rehab - 08/24/20 1210      Initial Vocational Rehab Evaluation & Intervention  Assessment shows need for Vocational Rehabilitation No   Mar is cuurently on leave from her job and does not need vocational rehab at this time          Education: Education Goals: Education classes will be provided on a weekly basis, covering required topics. Participant will state understanding/return demonstration of topics presented.  Learning Barriers/Preferences:  Learning Barriers/Preferences - 08/24/20 1209      Learning Barriers/Preferences   Learning Barriers None    Learning Preferences Skilled Demonstration;Individual Instruction           Education Topics: Hypertension, Hypertension Reduction -Define heart disease and high blood pressure. Discus how high blood pressure affects the body and ways to reduce high blood pressure.   Exercise and Your Heart -Discuss why it is important to exercise, the FITT  principles of exercise, normal and abnormal responses to exercise, and how to exercise safely.   Angina -Discuss definition of angina, causes of angina, treatment of angina, and how to decrease risk of having angina.   Cardiac Medications -Review what the following cardiac medications are used for, how they affect the body, and side effects that may occur when taking the medications.  Medications include Aspirin, Beta blockers, calcium channel blockers, ACE Inhibitors, angiotensin receptor blockers, diuretics, digoxin, and antihyperlipidemics.   Congestive Heart Failure -Discuss the definition of CHF, how to live with CHF, the signs and symptoms of CHF, and how keep track of weight and sodium intake.   Heart Disease and Intimacy -Discus the effect sexual activity has on the heart, how changes occur during intimacy as we age, and safety during sexual activity.   Smoking Cessation / COPD -Discuss different methods to quit smoking, the health benefits of quitting smoking, and the definition of COPD.   Nutrition I: Fats -Discuss the types of cholesterol, what cholesterol does to the heart, and how cholesterol levels can be controlled.   Nutrition II: Labels -Discuss the different components of food labels and how to read food label   Heart Parts/Heart Disease and PAD -Discuss the anatomy of the heart, the pathway of blood circulation through the heart, and these are affected by heart disease.   Stress I: Signs and Symptoms -Discuss the causes of stress, how stress may lead to anxiety and depression, and ways to limit stress.   Stress II: Relaxation -Discuss different types of relaxation techniques to limit stress.   Warning Signs of Stroke / TIA -Discuss definition of a stroke, what the signs and symptoms are of a stroke, and how to identify when someone is having stroke.   Knowledge Questionnaire Score:  Knowledge Questionnaire Score - 08/24/20 1152      Knowledge  Questionnaire Score   Pre Score 26/28           Core Components/Risk Factors/Patient Goals at Admission:  Personal Goals and Risk Factors at Admission - 08/24/20 1150      Core Components/Risk Factors/Patient Goals on Admission    Weight Management Yes;Obesity;Weight Loss    Intervention Weight Management/Obesity: Establish reasonable short term and long term weight goals.;Obesity: Provide education and appropriate resources to help participant work on and attain dietary goals.    Admit Weight 201 lb 8 oz (91.4 kg)    Goal Weight: Short Term 195 lb (88.5 kg)    Goal Weight: Long Term 180 lb (81.6 kg)    Expected Outcomes Short Term: Continue to assess and modify interventions until short term weight is achieved;Long Term: Adherence to nutrition and physical activity/exercise  program aimed toward attainment of established weight goal;Weight Loss: Understanding of general recommendations for a balanced deficit meal plan, which promotes 1-2 lb weight loss per week and includes a negative energy balance of [phone] kcal/d    Tobacco Cessation Yes    Number of packs per day 1/3    Intervention Assist the participant in steps to quit. Provide individualized education and counseling about committing to Tobacco Cessation, relapse prevention, and pharmacological support that can be provided by physician.;Advice worker, assist with locating and accessing local/national Quit Smoking programs, and support quit date choice.    Expected Outcomes Short Term: Will demonstrate readiness to quit, by selecting a quit date.;Short Term: Will quit all tobacco product use, adhering to prevention of relapse plan.;Long Term: Complete abstinence from all tobacco products for at least 12 months from quit date.    Hypertension Yes    Intervention Provide education on lifestyle modifcations including regular physical activity/exercise, weight management, moderate sodium restriction and increased consumption  of fresh fruit, vegetables, and low fat dairy, alcohol moderation, and smoking cessation.;Monitor prescription use compliance.    Expected Outcomes Short Term: Continued assessment and intervention until BP is < 140/74mm HG in hypertensive participants. < 130/30mm HG in hypertensive participants with diabetes, heart failure or chronic kidney disease.;Long Term: Maintenance of blood pressure at goal levels.    Stress Yes    Intervention Offer individual and/or small group education and counseling on adjustment to heart disease, stress management and health-related lifestyle change. Teach and support self-help strategies.;Refer participants experiencing significant psychosocial distress to appropriate mental health specialists for further evaluation and treatment. When possible, include family members and significant others in education/counseling sessions.    Expected Outcomes Short Term: Participant demonstrates changes in health-related behavior, relaxation and other stress management skills, ability to obtain effective social support, and compliance with psychotropic medications if prescribed.;Long Term: Emotional wellbeing is indicated by absence of clinically significant psychosocial distress or social isolation.           Core Components/Risk Factors/Patient Goals Review:    Core Components/Risk Factors/Patient Goals at Discharge (Final Review):    ITP Comments:  ITP Comments    Row Name 08/24/20 1031           ITP Comments Medical Director- Dr. Fransico Him, MD              Comments: Lesly Rubenstein attended orientation on 08/24/2020 to review rules and guidelines for program.  Completed 6 minute walk test, Intitial ITP, and exercise prescription.  VSS. Telemetry-Sinus Rhythm.  Asymptomatic. Safety measures and social distancing in place per CDC guidelines. Patient given phone number to 1-800-quit-now.Barnet Pall, RN,BSN 08/24/2020 12:20 PM

## 2020-08-24 NOTE — Progress Notes (Signed)
Cardiac Rehab Medication Review by a Nurse  Does the patient  feel that his/her medications are working for him/her?  yes  Has the patient been experiencing any side effects to the medications prescribed?  no  Does the patient measure his/her own blood pressure or blood glucose at home?  yes   Does the patient have any problems obtaining medications due to transportation or finances?   no  Understanding of regimen: excellent Understanding of indications: excellent Potential of compliance: excellent    Nurse comments: Anita Potter is taking her medications as prescribed and has a good understanding of what they are for.    Anita See Donelle Baba RN 08/24/2020 12:21 PM

## 2020-08-30 ENCOUNTER — Encounter (HOSPITAL_COMMUNITY): Payer: BC Managed Care – PPO

## 2020-09-01 ENCOUNTER — Encounter (HOSPITAL_COMMUNITY): Payer: BC Managed Care – PPO

## 2020-09-01 ENCOUNTER — Telehealth (HOSPITAL_COMMUNITY): Payer: Self-pay | Admitting: *Deleted

## 2020-09-01 NOTE — Telephone Encounter (Signed)
Anita Potter called tp report that she is taking her son to the doctor. Anita Potter will not be able to participate in phase 2 cardiac rehab today. Anita Potter hopes to start exercise on Friday.Barnet Pall, RN,BSN 09/01/2020 10:40 AM

## 2020-09-03 ENCOUNTER — Other Ambulatory Visit: Payer: Self-pay

## 2020-09-03 ENCOUNTER — Encounter (HOSPITAL_COMMUNITY)
Admission: RE | Admit: 2020-09-03 | Discharge: 2020-09-03 | Disposition: A | Discharge status: Home / Self Care | Payer: BC Managed Care – PPO | Source: Ambulatory Visit | Attending: Interventional Cardiology | Admitting: Interventional Cardiology

## 2020-09-03 DIAGNOSIS — I2542 Coronary artery dissection: Secondary | ICD-10-CM

## 2020-09-03 DIAGNOSIS — I214 Non-ST elevation (NSTEMI) myocardial infarction: Secondary | ICD-10-CM | POA: Diagnosis not present

## 2020-09-03 NOTE — Progress Notes (Signed)
Daily Session Note  Patient Details  Name: Anita Potter MRN: [id number] Date of Birth: Aug 30, 1985 Referring Provider:   Flowsheet Row CARDIAC REHAB PHASE II ORIENTATION from 08/24/2020 in [place]  Referring Provider Anita Booze, MD       Encounter Date: 09/03/2020  Check In:  Session Check In - 09/03/20 1129       Check-In   Supervising physician immediately available to respond to emergencies [facility] immediately available    Physician(s) Dr. Arbutus Ped    Location [facility]    Staff Present Barnet Pall, RN, BSN;Carlette Wilber Oliphant, RN, BSN;Ramon Dredge, RN, MHA;Jetta Walker BS, ACSM EP-C, Exercise Physiologist;Other   Elmon Else Exercise Physiologist   Virtual Visit No    Medication changes reported     No    Fall or balance concerns reported    No    Tobacco Cessation No Change    Warm-up and Cool-down Performed on first and last piece of equipment    Resistance Training Performed Yes    VAD Patient? No    PAD/SET Patient? No      Pain Assessment   Currently in Pain? No/denies    Pain Score 0-No pain    Multiple Pain Sites No             Capillary Blood Glucose: No results found for this or any previous visit (from the past 24 hour(s)).   Exercise Prescription Changes - 09/03/20 1100       Response to Exercise   Blood Pressure (Admit) 124/74    Blood Pressure (Exercise) 128/80    Blood Pressure (Exit) 118/70    Heart Rate (Admit) 90 bpm    Heart Rate (Exercise) 106 bpm    Heart Rate (Exit) 81 bpm    Rating of Perceived Exertion (Exercise) 9    Perceived Dyspnea (Exercise) 0    Symptoms 0    Comments Pt first day of exercise    Duration Progress to 30 minutes of  aerobic without signs/symptoms of physical distress    Intensity THRR unchanged      Progression   Progression Continue to progress workloads to maintain intensity without signs/symptoms of physical distress.     Average METs 2.4      Resistance Training   Training Prescription Yes    Weight 3 lbs    Reps 10-15    Time 10 Minutes      Treadmill   MPH 2.5    Grade 0    Minutes 15    METs 2.91      NuStep   Level 3    SPM 85    Minutes 15    METs 2             Social History   Tobacco Use  Smoking Status Every Day   Packs/day: 0.33   Pack years: 0.00   Types: Cigarettes  Smokeless Tobacco Never  Tobacco Comments   Patient given phone number for 1-800-quit-now    Goals Met:  Exercise tolerated well No report of cardiac concerns or symptoms Strength training completed today  Goals Unmet:  Not Applicable  Comments: Pt started cardiac rehab today.  Pt tolerated light exercise without difficulty. VSS, telemetry-Sinus Rhythm, asymptomatic.  Medication list reconciled. Pt denies barriers to medicaiton compliance.  PSYCHOSOCIAL ASSESSMENT:  PHQ-0. Pt exhibits positive coping skills, hopeful outlook with supportive family. QUALITY OF LIFE SCORE REVIEW  Pt completed Quality of Life survey  as a participant in Cardiac Rehab.  Scores 21.0 or below are considered low.  Pt score very low in several areas  except family .Overall 18.5, Health and Function 14.83, socioeconomic 21.29, physiological and spiritual 19.64, family .Anita Potter admits to having stress from recently giving birth, recent MI and financial stress due to being ut of work. Patient quality of life slightly altered by physical constraints which limits ability to perform as prior to recent cardiac illness.  Anita Potter enjoyed participating in cardiac rehab today and looks forward to participating in the program..  Offered emotional support and reassurance.  Will continue to monitor and intervene as necessary. .   Pt oriented to exercise equipment and routine.    Understanding verbalized.  , RN,BSN 09/03/2020 5:25 PM    Dr. Traci Turner is Medical Director for Cardiac Rehab at [facility]. 

## 2020-09-06 ENCOUNTER — Other Ambulatory Visit: Payer: Self-pay

## 2020-09-06 ENCOUNTER — Encounter (HOSPITAL_COMMUNITY)
Admission: RE | Admit: 2020-09-06 | Discharge: 2020-09-06 | Disposition: A | Discharge status: Home / Self Care | Payer: BC Managed Care – PPO | Source: Ambulatory Visit | Attending: Interventional Cardiology | Admitting: Interventional Cardiology

## 2020-09-06 DIAGNOSIS — I2542 Coronary artery dissection: Secondary | ICD-10-CM

## 2020-09-06 DIAGNOSIS — I214 Non-ST elevation (NSTEMI) myocardial infarction: Secondary | ICD-10-CM

## 2020-09-06 NOTE — Progress Notes (Deleted)
Cardiology Office Note   Date:  09/06/2020   ID:  Anita Potter, DOB February 10, 1986, MRN [id number]  PCP:  Pcp, No    No chief complaint on file.  SCAD  Wt Readings from Last 3 Encounters:  08/24/20 201 lb 8 oz (91.4 kg)  06/29/20 203 lb 9.6 oz (92.4 kg)  06/16/20 210 lb 5.1 oz (95.4 kg)       History of Present Illness: Anita Potter is a 35 y.o. female   Who had SCAD causing NSTEMI in 3/22.   Cath showed: "Dist Cx lesion is 75% stenosed. 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 There also appears to be dissection of the distal OM2. This likely explains the wall motion abnormality. Distal 3rd Mrg lesion is 75% stenosed. 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 Otherwise, large, widely patent coronary arteries without significant disease. The left ventricular systolic function is normal. LV end diastolic pressure is mildly elevated. The left ventricular ejection fraction is 55-65% by visual estimate. Small area of mid inferior hypokinesis. There is no aortic valve stenosis.   Spoke with husband about the importance of BP control.   No indication for PCI since she has normal flow.  Vessels should heal on its own. "     BP at home is in the 130/90s before medicine.  After medicine, it is in the 120/80 range.     Went to ER in 4/22 for palpitations. Monitor in 4/22 showed: "Normal sinus rhythm with rare PACs and PVCs.  No atrial fibrillation.  Symptoms correlated to PVCs.  No pathologic arrhythmias."      Past Medical History:  Diagnosis Date   Chronic hypertension 05/18/2019   History of gestational hypertension 06/15/2020   Hx of chlamydia infection 03/2016   Infection    UTI   Postpartum hypertension 07/09/2016   Pregnancy induced hypertension    meds PP for 6-8wks   Uterine fibroid     Past Surgical History:  Procedure Laterality Date   CARDIAC  CATHETERIZATION  06/15/2020   LEFT HEART CATH AND CORONARY ANGIOGRAPHY N/A 06/15/2020   Procedure: LEFT HEART CATH AND CORONARY ANGIOGRAPHY;  Surgeon: Jettie Booze, MD;  Location: [facility];  Service: Cardiovascular;  Laterality: N/A;   NO PAST SURGERIES       Current Outpatient Medications  Medication Sig Dispense Refill   acetaminophen (TYLENOL) 325 MG tablet Take 2 tablets (650 mg total) by mouth every 4 (four) hours as needed (for pain scale < 4). 30 tablet    ALPRAZolam (XANAX) 0.5 MG tablet Take 0.5 mg by mouth 3 (three) times daily as needed.     aspirin EC 81 MG EC tablet Take 1 tablet (81 mg total) by mouth daily. Swallow whole. 30 tablet 11   NIFEdipine (ADALAT CC) 30 MG 24 hr tablet Take 1 tablet (30 mg total) by mouth daily. 30 tablet 2   Prenatal Vit-Fe Fumarate-FA (MULTIVITAMIN-PRENATAL) 27-0.8 MG TABS tablet Take 1 tablet by mouth daily at 12 noon.     sertraline (ZOLOFT) 100 MG tablet Take 1 tablet by mouth daily.     No current facility-administered medications for this visit.    Allergies:   Patient has no known allergies.    Social History:  The patient  reports that she has been smoking cigarettes. She has been smoking an average of 0.33 packs per day. She has never  used smokeless tobacco. She reports previous alcohol use. She reports that she does not use drugs.   Family History:  The patient's ***family history includes Hypertension in her mother; Lung cancer in her father.    ROS:  Please see the history of present illness.   Otherwise, review of systems are positive for ***.   All other systems are reviewed and negative.    PHYSICAL EXAM: VS:  There were no vitals taken for this visit. , BMI There is no height or weight on file to calculate BMI. GEN: Well nourished, well developed, in no acute distress HEENT: normal Neck: no JVD, carotid bruits, or masses Cardiac: ***RRR; no murmurs, rubs, or gallops,no edema  Respiratory:  clear to  auscultation bilaterally, normal work of breathing GI: soft, nontender, nondistended, + BS MS: no deformity or atrophy Skin: warm and dry, no rash Neuro:  Strength and sensation are intact Psych: euthymic mood, full affect   EKG:   The ekg ordered today demonstrates ***   Recent Labs: 06/14/2020: B Natriuretic Peptide 29.2 06/15/2020: TSH 2.916 06/16/2020: Magnesium 2.0 06/28/2020: ALT 18; BUN 10; Creatinine, Ser 0.62; Hemoglobin 14.0; Platelets 292; Potassium 3.4; Sodium 136   Lipid Panel    Component Value Date/Time   CHOL 182 06/15/2020 0442   TRIG 75 06/15/2020 0442   HDL 41 06/15/2020 0442   CHOLHDL 4.4 06/15/2020 0442   VLDL 15 06/15/2020 0442   LDLCALC 126 (H) 06/15/2020 0442     Other studies Reviewed: Additional studies/ records that were reviewed today with results demonstrating: ***.   ASSESSMENT AND PLAN:  SCAD: HTN: PVCs:   Current medicines are reviewed at length with the patient today.  The patient concerns regarding her medicines were addressed.  The following changes have been made:  No change***  Labs/ tests ordered today include: *** No orders of the defined types were placed in this encounter.   Recommend 150 minutes/week of aerobic exercise Low fat, low carb, high fiber diet recommended  Disposition:   FU in ***   Signed, Larae Grooms, MD  09/06/2020 7:32 PM    [facility] [address], [place], [place]  [address] Phone: [phone]; Fax: [phone]

## 2020-09-06 NOTE — Progress Notes (Signed)
Anita Potter 35 y.o. female Nutrition Note  Diagnosis: STEMI, spontaneous dissection of coronary artery   Past Medical History:  Diagnosis Date   Chronic hypertension 05/18/2019   History of gestational hypertension 06/15/2020   Hx of chlamydia infection 03/2016   Infection    UTI   Postpartum hypertension 07/09/2016   Pregnancy induced hypertension    meds PP for 6-8wks   Uterine fibroid      Medications reviewed.   Current Outpatient Medications:    acetaminophen (TYLENOL) 325 MG tablet, Take 2 tablets (650 mg total) by mouth every 4 (four) hours as needed (for pain scale < 4)., Disp: 30 tablet, Rfl:    ALPRAZolam (XANAX) 0.5 MG tablet, Take 0.5 mg by mouth 3 (three) times daily as needed., Disp: , Rfl:    aspirin EC 81 MG EC tablet, Take 1 tablet (81 mg total) by mouth daily. Swallow whole., Disp: 30 tablet, Rfl: 11   NIFEdipine (ADALAT CC) 30 MG 24 hr tablet, Take 1 tablet (30 mg total) by mouth daily., Disp: 30 tablet, Rfl: 2   Prenatal Vit-Fe Fumarate-FA (MULTIVITAMIN-PRENATAL) 27-0.8 MG TABS tablet, Take 1 tablet by mouth daily at 12 noon., Disp: , Rfl:    sertraline (ZOLOFT) 100 MG tablet, Take 1 tablet by mouth daily., Disp: , Rfl:    Ht Readings from Last 1 Encounters:  08/24/20 5' 6.5" (1.689 m)     Wt Readings from Last 3 Encounters:  08/24/20 201 lb 8 oz (91.4 kg)  06/29/20 203 lb 9.6 oz (92.4 kg)  06/16/20 210 lb 5.1 oz (95.4 kg)     There is no height or weight on file to calculate BMI.   Social History   Tobacco Use  Smoking Status Every Day   Packs/day: 0.33   Pack years: 0.00   Types: Cigarettes  Smokeless Tobacco Never  Tobacco Comments   Patient given phone number for 1-800-quit-now     Lab Results  Component Value Date   CHOL 182 06/15/2020   Lab Results  Component Value Date   HDL 41 06/15/2020   Lab Results  Component Value Date   LDLCALC 126 (H) 06/15/2020   Lab Results  Component Value Date   TRIG 75 06/15/2020     Lab  Results  Component Value Date   HGBA1C 5.5 06/15/2020     CBG (last 3)  No results for input(s): GLUCAP in the last 72 hours.   Nutrition Note  Spoke with pt. Nutrition Plan and Nutrition Survey goals reviewed with pt. Pt is trying to follow a Heart Healthy diet. She is 4 months postpartum and solely breastfeeding. She continues taking HTN medication but states wanting to get off of it. She monitors BP at home.  Per discussion, pt does use canned/convenience foods often. Pt does not add salt to food. Pt does not eat out frequently.  Pt eats a variety of foods including daily intake of fruits, vegetables, and whole grains. She identified areas to improve.  She has been eating few red/orange vegetables.  Pt expressed understanding of the information reviewed.   Nutrition Diagnosis  Excessive mineral intake (sodium) related to lack of exposure to information as evidenced intake of high sodium foods  Nutrition Intervention Pt's individual nutrition plan reviewed with pt. 1500-2000 mg sodium, plate method, adequate potassium intake, 2600 kcals to support breastfeeding, 118 g protein    Pt given handouts for: ?HTN nutrition therapy Continue client-centered nutrition education by RD, as part of interdisciplinary care.  Goal(s)  Pt to build a healthy plate including vegetables, fruits, whole grains, and low-fat dairy products in a heart healthy meal plan. Pt to reduce sodium intake by reading labels and using salt substitutes   Plan:   Will provide client-centered nutrition education as part of interdisciplinary care Monitor and evaluate progress toward nutrition goal with team.   Michaele Offer, MS, RDN, LDN

## 2020-09-07 ENCOUNTER — Ambulatory Visit: Payer: BC Managed Care – PPO | Admitting: Interventional Cardiology

## 2020-09-07 NOTE — Progress Notes (Signed)
Cardiac Individual Treatment Plan  Patient Details  Name: Anita Potter MRN: [id number] Date of Birth: 03/31/1985 Referring Provider:   Flowsheet Row CARDIAC REHAB PHASE II ORIENTATION from 08/24/2020 in [facility]  Referring Provider Jettie Booze, MD       Initial Encounter Date:  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 Swallow whole., Disp: 30 tablet, Rfl: 11   NIFEdipine (ADALAT CC) 30 MG 24 hr tablet, Take 1 tablet (30 mg total) by mouth daily., Disp: 30 tablet, Rfl: 2   Prenatal Vit-Fe Fumarate-FA (MULTIVITAMIN-PRENATAL) 27-0.8 MG TABS tablet, Take 1 tablet by mouth daily at 12 noon., Disp: , Rfl:    sertraline (ZOLOFT) 100 MG tablet, Take 1 tablet by mouth daily., Disp: , Rfl:   Past Medical History: Past Medical History:  Diagnosis Date   Chronic hypertension 05/18/2019   History of gestational hypertension 06/15/2020   Hx of chlamydia infection 03/2016   Infection    UTI   Postpartum hypertension 07/09/2016   Pregnancy induced hypertension    meds PP for 6-8wks   Uterine fibroid     Tobacco Use: Social History   Tobacco Use  Smoking Status Every Day   Packs/day: 0.33   Pack years: 0.00   Types: Cigarettes  Smokeless Tobacco Never  Tobacco Comments   Patient given phone number for 1-800-quit-now    Labs: Recent Review Flowsheet Data      Labs for ITP Cardiac and Pulmonary Rehab Latest Ref Rng & Units 09/22/2012 06/15/2020   Cholestrol 0 - 200 mg/dL - 182   LDLCALC 0 - 99 mg/dL - 126(H)   HDL >40 mg/dL - 41   Trlycerides <150 mg/dL - 75   Hemoglobin A1c 4.8 - 5.6 % - 5.5   TCO2 0 - 100 mmol/L 26 -       Capillary Blood Glucose: No results found for: GLUCAP   Exercise Target Goals: Exercise Program Goal: Individual exercise prescription set using results from initial 6 min walk test and THRR while considering  patient's activity barriers and safety.   Exercise Prescription Goal: Initial exercise prescription builds to 30-45 minutes a day of aerobic activity, 2-3 days per week.  Home exercise guidelines will be given to patient during program as part of exercise prescription that the participant will acknowledge.  Activity Barriers & Risk Stratification:  Activity Barriers & Cardiac Risk Stratification - 08/24/20 1100       Activity Barriers & Cardiac Risk Stratification   Activity Barriers None    Cardiac Risk Stratification High             6 Minute Walk:  6 Minute Walk     Row Name 08/24/20 1110         6 Minute Walk   Phase Initial     Distance 1400 feet  Walk Time 6 minutes     # of Rest Breaks 0     MPH 2.65     METS 4.95     RPE 9     Perceived Dyspnea  0     VO2 Peak 17.34     Symptoms No     Resting HR 92 bpm     Resting BP 112/64     Resting Oxygen Saturation  99 %     Exercise Oxygen Saturation  during 6 min walk 97 %     Max Ex. HR 116 bpm     Max Ex. BP 128/82     2 Minute Post BP 122/76              Oxygen Initial Assessment:   Oxygen Re-Evaluation:   Oxygen Discharge (Final Oxygen Re-Evaluation):   Initial Exercise Prescription:  Initial Exercise Prescription - 08/24/20 1100       Date of Initial Exercise RX and Referring Provider   Date 08/24/20    Referring Provider Jettie Booze, MD    Expected Discharge Date 10/22/20      Treadmill   MPH 2.5     Grade 0    Minutes 15    METs 2.91      NuStep   Level 3    SPM 85    Minutes 15    METs 2.5      Prescription Details   Frequency (times per week) 3    Duration Progress to 30 minutes of continuous aerobic without signs/symptoms of physical distress      Intensity   THRR 40-80% of Max Heartrate 75-150    Ratings of Perceived Exertion 11-13    Perceived Dyspnea 0-4      Progression   Progression Continue to progress workloads to maintain intensity without signs/symptoms of physical distress.      Resistance Training   Training Prescription Yes    Weight 3 lbs    Reps 10-15             Perform Capillary Blood Glucose checks as needed.  Exercise Prescription Changes:   Exercise Prescription Changes     Row Name 09/03/20 1100 09/06/20 1113           Response to Exercise   Blood Pressure (Admit) 124/74 126/68      Blood Pressure (Exercise) 128/80 130/78      Blood Pressure (Exit) 118/70 120/82      Heart Rate (Admit) 90 bpm 99 bpm      Heart Rate (Exercise) 106 bpm 113 bpm      Heart Rate (Exit) 81 bpm 85 bpm      Rating of Perceived Exertion (Exercise) 9 11      Perceived Dyspnea (Exercise) 0 --      Symptoms 0 none      Comments Pt first day of exercise --      Duration Progress to 30 minutes of  aerobic without signs/symptoms of physical distress Progress to 30 minutes of  aerobic without signs/symptoms of physical distress      Intensity THRR unchanged THRR unchanged             Progression      Progression Continue to progress workloads to maintain intensity without signs/symptoms of physical distress. Continue to progress workloads to maintain intensity without signs/symptoms of physical distress.      Average METs 2.4 2.8  Resistance Training      Training Prescription Yes Yes      Weight 3 lbs 3 lbs      Reps 10-15 10-15      Time 10 Minutes 10 Minutes             Treadmill      MPH 2.5 2.5      Grade 0 1      Minutes 15 15       METs 2.91 3.26             NuStep      Level 3 3      SPM 85 85      Minutes 15 15      METs 2 2.4              Exercise Comments:   Exercise Comments     Row Name 09/03/20 1100 09/06/20 1100         Exercise Comments Pt first day in the CRP2 program. Pt tolerated exercise well. Will continue to monitor pt and progress workloads as tolerated without sign or symptom. Patient is progressing well with exercise. Increased incline on treadmill today.               Exercise Goals and Review:   Exercise Goals     Row Name 08/24/20 1032             Exercise Goals   Increase Physical Activity Yes       Intervention Provide advice, education, support and counseling about physical activity/exercise needs.;Develop an individualized exercise prescription for aerobic and resistive training based on initial evaluation findings, risk stratification, comorbidities and participant's personal goals.       Expected Outcomes Short Term: Attend rehab on a regular basis to increase amount of physical activity.;Long Term: Exercising regularly at least 3-5 days a week.;Long Term: Add in home exercise to make exercise part of routine and to increase amount of physical activity.       Increase Strength and Stamina Yes       Intervention Provide advice, education, support and counseling about physical activity/exercise needs.;Develop an individualized exercise prescription for aerobic and resistive training based on initial evaluation findings, risk stratification, comorbidities and participant's personal goals.       Expected Outcomes Short Term: Increase workloads from initial exercise prescription for resistance, speed, and METs.;Short Term: Perform resistance training exercises routinely during rehab and add in resistance training at home;Long Term: Improve cardiorespiratory fitness, muscular endurance and strength as measured by increased METs and functional capacity (6MWT)       Able to  understand and use rate of perceived exertion (RPE) scale Yes       Intervention Provide education and explanation on how to use RPE scale       Expected Outcomes Short Term: Able to use RPE daily in rehab to express subjective intensity level;Long Term:  Able to use RPE to guide intensity level when exercising independently       Knowledge and understanding of Target Heart Rate Range (THRR) Yes       Intervention Provide education and explanation of THRR including how the numbers were predicted and where they are located for reference       Expected Outcomes Short Term: Able to state/look up THRR;Long Term: Able to use THRR to govern intensity when exercising independently;Short Term: Able to use daily as guideline for intensity in rehab  Able to check pulse independently Yes       Intervention Provide education and demonstration on how to check pulse in carotid and radial arteries.;Review the importance of being able to check your own pulse for safety during independent exercise       Expected Outcomes Short Term: Able to explain why pulse checking is important during independent exercise;Long Term: Able to check pulse independently and accurately       Understanding of Exercise Prescription Yes       Intervention Provide education, explanation, and written materials on patient's individual exercise prescription       Expected Outcomes Short Term: Able to explain program exercise prescription;Long Term: Able to explain home exercise prescription to exercise independently                Exercise Goals Re-Evaluation :  Exercise Goals Re-Evaluation     Row Name 09/03/20 1100             Exercise Goal Re-Evaluation   Exercise Goals Review Increase Physical Activity;Increase Strength and Stamina;Able to understand and use rate of perceived exertion (RPE) scale;Knowledge and understanding of Target Heart Rate Range (THRR);Understanding of Exercise Prescription       Comments Pt first  day in the CRP2 program. Pt tolerated exercise well and had an average MET level of 2.4. Pt understands RPE, THRR and exercise Rx.       Expected Outcomes Will continue to monitor pt and progress workloads as tolerated without sign or symptom.                Discharge Exercise Prescription (Final Exercise Prescription Changes):  Exercise Prescription Changes - 09/06/20 1113       Response to Exercise   Blood Pressure (Admit) 126/68    Blood Pressure (Exercise) 130/78    Blood Pressure (Exit) 120/82    Heart Rate (Admit) 99 bpm    Heart Rate (Exercise) 113 bpm    Heart Rate (Exit) 85 bpm    Rating of Perceived Exertion (Exercise) 11    Symptoms none    Duration Progress to 30 minutes of  aerobic without signs/symptoms of physical distress    Intensity THRR unchanged      Progression   Progression Continue to progress workloads to maintain intensity without signs/symptoms of physical distress.    Average METs 2.8      Resistance Training   Training Prescription Yes    Weight 3 lbs    Reps 10-15    Time 10 Minutes      Treadmill   MPH 2.5    Grade 1    Minutes 15    METs 3.26      NuStep   Level 3    SPM 85    Minutes 15    METs 2.4             Nutrition:  Target Goals: Understanding of nutrition guidelines, daily intake of sodium <1510m, cholesterol <2079m calories 30% from fat and 7% or less from saturated fats, daily to have 5 or more servings of fruits and vegetables.  Biometrics:  Pre Biometrics - 08/24/20 1030       Pre Biometrics   Waist Circumference 42 inches    Hip Circumference 46.5 inches    Waist to Hip Ratio 0.9 %    Triceps Skinfold 38 mm    % Body Fat 42.4 %    Grip Strength 39 kg    Flexibility 20 in  Single Leg Stand 30 seconds              Nutrition Therapy Plan and Nutrition Goals:   Nutrition Assessments:  MEDIFICTS Score Key: ?70 Need to make dietary changes  40-70 Heart Healthy Diet ? 40 Therapeutic Level  Cholesterol Diet   Flowsheet Row CARDIAC REHAB PHASE II EXERCISE from 09/03/2020 in [facility]  Picture Your Plate Total Score on Admission 52      Picture Your Plate Scores: <02 Unhealthy dietary pattern with much room for improvement. 41-50 Dietary pattern unlikely to meet recommendations for good health and room for improvement. 51-60 More healthful dietary pattern, with some room for improvement.  >60 Healthy dietary pattern, although there may be some specific behaviors that could be improved.    Nutrition Goals Re-Evaluation:   Nutrition Goals Re-Evaluation:   Nutrition Goals Discharge (Final Nutrition Goals Re-Evaluation):   Psychosocial: Target Goals: Acknowledge presence or absence of significant depression and/or stress, maximize coping skills, provide positive support system. Participant is able to verbalize types and ability to use techniques and skills needed for reducing stress and depression.  Initial Review & Psychosocial Screening:  Initial Psych Review & Screening - 08/24/20 1200       Initial Review   Current issues with Current Stress Concerns;Current Depression;Current Anxiety/Panic    Source of Stress Concerns Chronic Illness;Financial;Unable to perform yard/household activities;Unable to participate in former interests or hobbies;Family    Comments Latisia has experienced a lot of stress, anxiety and reports having panic attacks. Dennette is currently on an anitdepressant and is seeing a Social worker. Melyssa has not had a a panic attack in over a month.      Family Dynamics   Good Support System? Yes   Erin Fulling has her husband, mother in law who is currently living with her and has a mother in law who lives near by     Barriers   Psychosocial barriers to participate in program The patient should benefit from training in stress management and relaxation.;Psychosocial barriers identified (see note)      Screening  Interventions   Interventions Encouraged to exercise;To provide support and resources with identified psychosocial needs;Provide feedback about the scores to participant    Expected Outcomes Short Term goal: Identification and review with participant of any Quality of Life or Depression concerns found by scoring the questionnaire.;Long Term Goal: Stressors or current issues are controlled or eliminated.;Short Term goal: Utilizing psychosocial counselor, staff and physician to assist with identification of specific Stressors or current issues interfering with healing process. Setting desired goal for each stressor or current issue identified.;Long Term goal: The participant improves quality of Life and PHQ9 Scores as seen by post scores and/or verbalization of changes             Quality of Life Scores:  Quality of Life - 08/24/20 1152       Quality of Life   Select Quality of Life      Quality of Life Scores   Health/Function Pre 14.83 %    Socioeconomic Pre 21.29 %    Psych/Spiritual Pre 19.64 %    Family Pre 24 %    GLOBAL Pre 18.5 %            Scores of 19 and below usually indicate a poorer quality of life in these areas.  A difference of  2-3 points is a clinically meaningful difference.  A difference of 2-3 points in the total score of  the Quality of Life Index has been associated with significant improvement in overall quality of life, self-image, physical symptoms, and general health in studies assessing change in quality of life.  PHQ-9: Recent Review Flowsheet Data     Depression screen [facility] 2/9 08/24/2020 10/30/2017 10/16/2017 09/18/2017 08/08/2017   Decreased Interest 0 0 1 1 0   Down, Depressed, Hopeless 1 0 0 0 0   PHQ - 2 Score 1 0 1 1 0   Altered sleeping - 0 2 1 0   Tired, decreased energy - 0 2 1 0   Change in appetite - 0 0 0 0   Feeling bad or failure about yourself  - 0 0 0 0   Trouble concentrating - 0 0 0 0   Moving slowly or fidgety/restless - 0 0 0 0    Suicidal thoughts - 0 0 0 0   PHQ-9 Score - 0 5 3 0      Interpretation of Total Score  Total Score Depression Severity:  1-4 = Minimal depression, 5-9 = Mild depression, 10-14 = Moderate depression, 15-19 = Moderately severe depression, 20-27 = Severe depression   Psychosocial Evaluation and Intervention:   Psychosocial Re-Evaluation:  Psychosocial Re-Evaluation     Row Name 09/07/20 1538             Psychosocial Re-Evaluation   Current issues with Current Abuse or Neglect to Report;History of Depression;Current Anxiety/Panic       Comments Reviewed quality of life questionnaire with Jessicynt on 09/03/20 will forward to her primary care. Catrena reports feeling better since she has been participating in cardiac rehab.       Expected Outcomes Makaiya will have decreased stressors/ depression upon completion of phase 2 cardiac rehab       Interventions Encouraged to attend Cardiac Rehabilitation for the exercise;Stress management education       Continue Psychosocial Services  Follow up required by staff                Psychosocial Discharge (Final Psychosocial Re-Evaluation):  Psychosocial Re-Evaluation - 09/07/20 1538       Psychosocial Re-Evaluation   Current issues with Current Abuse or Neglect to Report;History of Depression;Current Anxiety/Panic    Comments Reviewed quality of life questionnaire with Jessicynt on 09/03/20 will forward to her primary care. Jhoanna reports feeling better since she has been participating in cardiac rehab.    Expected Outcomes Zelda will have decreased stressors/ depression upon completion of phase 2 cardiac rehab    Interventions Encouraged to attend Cardiac Rehabilitation for the exercise;Stress management education    Continue Psychosocial Services  Follow up required by staff             Vocational Rehabilitation: Provide vocational rehab assistance to qualifying candidates.   Vocational Rehab Evaluation &  Intervention:  Vocational Rehab - 08/24/20 1210       Initial Vocational Rehab Evaluation & Intervention   Assessment shows need for Vocational Rehabilitation No   Mysti is cuurently on leave from her job and does not need vocational rehab at this time            Education: Education Goals: Education classes will be provided on a weekly basis, covering required topics. Participant will state understanding/return demonstration of topics presented.  Learning Barriers/Preferences:  Learning Barriers/Preferences - 08/24/20 1209       Learning Barriers/Preferences   Learning Barriers None    Learning Preferences Skilled Demonstration;Individual Instruction  Education Topics: Count Your Pulse:  -Group instruction provided by verbal instruction, demonstration, patient participation and written materials to support subject.  Instructors address importance of being able to find your pulse and how to count your pulse when at home without a heart monitor.  Patients get hands on experience counting their pulse with staff help and individually.   Heart Attack, Angina, and Risk Factor Modification:  -Group instruction provided by verbal instruction, video, and written materials to support subject.  Instructors address signs and symptoms of angina and heart attacks.    Also discuss risk factors for heart disease and how to make changes to improve heart health risk factors.   Functional Fitness:  -Group instruction provided by verbal instruction, demonstration, patient participation, and written materials to support subject.  Instructors address safety measures for doing things around the house.  Discuss how to get up and down off the floor, how to pick things up properly, how to safely get out of a chair without assistance, and balance training.   Meditation and Mindfulness:  -Group instruction provided by verbal instruction, patient participation, and written materials to  support subject.  Instructor addresses importance of mindfulness and meditation practice to help reduce stress and improve awareness.  Instructor also leads participants through a meditation exercise.    Stretching for Flexibility and Mobility:  -Group instruction provided by verbal instruction, patient participation, and written materials to support subject.  Instructors lead participants through series of stretches that are designed to increase flexibility thus improving mobility.  These stretches are additional exercise for major muscle groups that are typically performed during regular warm up and cool down.   Hands Only CPR:  -Group verbal, video, and participation provides a basic overview of AHA guidelines for community CPR. Role-play of emergencies allow participants the opportunity to practice calling for help and chest compression technique with discussion of AED use.   Hypertension: -Group verbal and written instruction that provides a basic overview of hypertension including the most recent diagnostic guidelines, risk factor reduction with self-care instructions and medication management.    Nutrition I class: Heart Healthy Eating:  -Group instruction provided by PowerPoint slides, verbal discussion, and written materials to support subject matter. The instructor gives an explanation and review of the Therapeutic Lifestyle Changes diet recommendations, which includes a discussion on lipid goals, dietary fat, sodium, fiber, plant stanol/sterol esters, sugar, and the components of a well-balanced, healthy diet.   Nutrition II class: Lifestyle Skills:  -Group instruction provided by PowerPoint slides, verbal discussion, and written materials to support subject matter. The instructor gives an explanation and review of label reading, grocery shopping for heart health, heart healthy recipe modifications, and ways to make healthier choices when eating out.   Diabetes Question & Answer:   -Group instruction provided by PowerPoint slides, verbal discussion, and written materials to support subject matter. The instructor gives an explanation and review of diabetes co-morbidities, pre- and post-prandial blood glucose goals, pre-exercise blood glucose goals, signs, symptoms, and treatment of hypoglycemia and hyperglycemia, and foot care basics.   Diabetes Blitz:  -Group instruction provided by PowerPoint slides, verbal discussion, and written materials to support subject matter. The instructor gives an explanation and review of the physiology behind type 1 and type 2 diabetes, diabetes medications and rational behind using different medications, pre- and post-prandial blood glucose recommendations and Hemoglobin A1c goals, diabetes diet, and exercise including blood glucose guidelines for exercising safely.    Portion Distortion:  -Group instruction provided by PowerPoint slides,  verbal discussion, written materials, and food models to support subject matter. The instructor gives an explanation of serving size versus portion size, changes in portions sizes over the last 20 years, and what consists of a serving from each food group.   Stress Management:  -Group instruction provided by verbal instruction, video, and written materials to support subject matter.  Instructors review role of stress in heart disease and how to cope with stress positively.     Exercising on Your Own:  -Group instruction provided by verbal instruction, power point, and written materials to support subject.  Instructors discuss benefits of exercise, components of exercise, frequency and intensity of exercise, and end points for exercise.  Also discuss use of nitroglycerin and activating EMS.  Review options of places to exercise outside of rehab.  Review guidelines for sex with heart disease.   Cardiac Drugs I:  -Group instruction provided by verbal instruction and written materials to support subject.   Instructor reviews cardiac drug classes: antiplatelets, anticoagulants, beta blockers, and statins.  Instructor discusses reasons, side effects, and lifestyle considerations for each drug class.   Cardiac Drugs II:  -Group instruction provided by verbal instruction and written materials to support subject.  Instructor reviews cardiac drug classes: angiotensin converting enzyme inhibitors (ACE-I), angiotensin II receptor blockers (ARBs), nitrates, and calcium channel blockers.  Instructor discusses reasons, side effects, and lifestyle considerations for each drug class.   Anatomy and Physiology of the Circulatory System:  Group verbal and written instruction and models provide basic cardiac anatomy and physiology, with the coronary electrical and arterial systems. Review of: AMI, Angina, Valve disease, Heart Failure, Peripheral Artery Disease, Cardiac Arrhythmia, Pacemakers, and the ICD.   Other Education:  -Group or individual verbal, written, or video instructions that support the educational goals of the cardiac rehab program.   Holiday Eating Survival Tips:  -Group instruction provided by PowerPoint slides, verbal discussion, and written materials to support subject matter. The instructor gives patients tips, tricks, and techniques to help them not only survive but enjoy the holidays despite the onslaught of food that accompanies the holidays.   Knowledge Questionnaire Score:  Knowledge Questionnaire Score - 08/24/20 1152       Knowledge Questionnaire Score   Pre Score 26/28             Core Components/Risk Factors/Patient Goals at Admission:  Personal Goals and Risk Factors at Admission - 08/24/20 1150       Core Components/Risk Factors/Patient Goals on Admission    Weight Management Yes;Obesity;Weight Loss    Intervention Weight Management/Obesity: Establish reasonable short term and long term weight goals.;Obesity: Provide education and appropriate resources to help  participant work on and attain dietary goals.    Admit Weight 201 lb 8 oz (91.4 kg)    Goal Weight: Short Term 195 lb (88.5 kg)    Goal Weight: Long Term 180 lb (81.6 kg)    Expected Outcomes Short Term: Continue to assess and modify interventions until short term weight is achieved;Long Term: Adherence to nutrition and physical activity/exercise program aimed toward attainment of established weight goal;Weight Loss: Understanding of general recommendations for a balanced deficit meal plan, which promotes 1-2 lb weight loss per week and includes a negative energy balance of [phone] kcal/d    Tobacco Cessation Yes    Number of packs per day 1/3    Intervention Assist the participant in steps to quit. Provide individualized education and counseling about committing to Tobacco Cessation, relapse prevention, and pharmacological support  that can be provided by physician.;Advice worker, assist with locating and accessing local/national Quit Smoking programs, and support quit date choice.    Expected Outcomes Short Term: Will demonstrate readiness to quit, by selecting a quit date.;Short Term: Will quit all tobacco product use, adhering to prevention of relapse plan.;Long Term: Complete abstinence from all tobacco products for at least 12 months from quit date.    Hypertension Yes    Intervention Provide education on lifestyle modifcations including regular physical activity/exercise, weight management, moderate sodium restriction and increased consumption of fresh fruit, vegetables, and low fat dairy, alcohol moderation, and smoking cessation.;Monitor prescription use compliance.    Expected Outcomes Short Term: Continued assessment and intervention until BP is < 140/12m HG in hypertensive participants. < 130/876mHG in hypertensive participants with diabetes, heart failure or chronic kidney disease.;Long Term: Maintenance of blood pressure at goal levels.    Stress Yes    Intervention Offer  individual and/or small group education and counseling on adjustment to heart disease, stress management and health-related lifestyle change. Teach and support self-help strategies.;Refer participants experiencing significant psychosocial distress to appropriate mental health specialists for further evaluation and treatment. When possible, include family members and significant others in education/counseling sessions.    Expected Outcomes Short Term: Participant demonstrates changes in health-related behavior, relaxation and other stress management skills, ability to obtain effective social support, and compliance with psychotropic medications if prescribed.;Long Term: Emotional wellbeing is indicated by absence of clinically significant psychosocial distress or social isolation.             Core Components/Risk Factors/Patient Goals Review:   Goals and Risk Factor Review     Row Name 09/07/20 1542             Core Components/Risk Factors/Patient Goals Review   Personal Goals Review Weight Management/Obesity;Stress;Hypertension;Lipids;Tobacco Cessation       Review Tanea is off to a great start to exercise. Vital signs have been stable. Merrianne will continue to work on smoking cessation       Expected Outcomes Tanay will continue to participate in phase 2 cardiac rehab for exercise, nutrition and lifestyle modifications                Core Components/Risk Factors/Patient Goals at Discharge (Final Review):   Goals and Risk Factor Review - 09/07/20 1542       Core Components/Risk Factors/Patient Goals Review   Personal Goals Review Weight Management/Obesity;Stress;Hypertension;Lipids;Tobacco Cessation    Review Zyann is off to a great start to exercise. Vital signs have been stable. Kaleyah will continue to work on smoking cessation    Expected Outcomes Danielys will continue to participate in phase 2 cardiac rehab for exercise, nutrition and lifestyle modifications              ITP Comments:  ITP Comments     Row Name 08/24/20 1031 09/07/20 1537         ITP Comments Medical Director- Dr. TrFransico HimMD 30 Day ITP Review. Ayannah started exercise on 09/03/20 and is off to a good start to exercise               Comments: See ITP Comments

## 2020-09-08 ENCOUNTER — Other Ambulatory Visit: Payer: Self-pay

## 2020-09-08 ENCOUNTER — Encounter (HOSPITAL_COMMUNITY)
Admission: RE | Admit: 2020-09-08 | Discharge: 2020-09-08 | Disposition: A | Discharge status: Home / Self Care | Payer: BC Managed Care – PPO | Source: Ambulatory Visit | Attending: Interventional Cardiology | Admitting: Interventional Cardiology

## 2020-09-08 DIAGNOSIS — I214 Non-ST elevation (NSTEMI) myocardial infarction: Secondary | ICD-10-CM | POA: Diagnosis not present

## 2020-09-10 ENCOUNTER — Other Ambulatory Visit: Payer: Self-pay

## 2020-09-10 ENCOUNTER — Encounter (HOSPITAL_COMMUNITY)
Admission: RE | Admit: 2020-09-10 | Discharge: 2020-09-10 | Disposition: A | Discharge status: Home / Self Care | Payer: BC Managed Care – PPO | Source: Ambulatory Visit | Attending: Interventional Cardiology | Admitting: Interventional Cardiology

## 2020-09-10 DIAGNOSIS — I2542 Coronary artery dissection: Secondary | ICD-10-CM

## 2020-09-10 DIAGNOSIS — I214 Non-ST elevation (NSTEMI) myocardial infarction: Secondary | ICD-10-CM

## 2020-09-13 ENCOUNTER — Other Ambulatory Visit: Payer: Self-pay

## 2020-09-13 ENCOUNTER — Encounter (HOSPITAL_COMMUNITY)
Admission: RE | Admit: 2020-09-13 | Discharge: 2020-09-13 | Disposition: A | Discharge status: Home / Self Care | Payer: BC Managed Care – PPO | Source: Ambulatory Visit | Attending: Interventional Cardiology | Admitting: Interventional Cardiology

## 2020-09-13 DIAGNOSIS — I2542 Coronary artery dissection: Secondary | ICD-10-CM

## 2020-09-13 DIAGNOSIS — I214 Non-ST elevation (NSTEMI) myocardial infarction: Secondary | ICD-10-CM | POA: Diagnosis not present

## 2020-09-13 NOTE — Progress Notes (Signed)
Reviewed home exercise guidelines with patient including endpoints, temperature precautions, target heart rate and rate of perceived exertion. Patient plans to walk as her mode of home exercise. Patient voices understanding of instructions given.  Barba Solt M Jessy Cybulski, MS, ACSM CEP  

## 2020-09-15 ENCOUNTER — Encounter (HOSPITAL_COMMUNITY)
Admission: RE | Admit: 2020-09-15 | Discharge: 2020-09-15 | Disposition: A | Discharge status: Home / Self Care | Payer: BC Managed Care – PPO | Source: Ambulatory Visit | Attending: Interventional Cardiology | Admitting: Interventional Cardiology

## 2020-09-15 ENCOUNTER — Other Ambulatory Visit: Payer: Self-pay

## 2020-09-15 DIAGNOSIS — I214 Non-ST elevation (NSTEMI) myocardial infarction: Secondary | ICD-10-CM

## 2020-09-15 DIAGNOSIS — I2542 Coronary artery dissection: Secondary | ICD-10-CM

## 2020-09-17 ENCOUNTER — Encounter (HOSPITAL_COMMUNITY): Payer: BC Managed Care – PPO

## 2020-09-22 ENCOUNTER — Ambulatory Visit (HOSPITAL_COMMUNITY)
Admission: RE | Admit: 2020-09-22 | Discharge: 2020-09-22 | Disposition: A | Discharge status: Home / Self Care | Payer: BC Managed Care – PPO | Source: Ambulatory Visit | Attending: Interventional Cardiology | Admitting: Interventional Cardiology

## 2020-09-22 ENCOUNTER — Encounter (HOSPITAL_COMMUNITY)
Admission: RE | Admit: 2020-09-22 | Discharge: 2020-09-22 | Disposition: A | Discharge status: Home / Self Care | Payer: BC Managed Care – PPO | Source: Ambulatory Visit | Attending: Interventional Cardiology | Admitting: Interventional Cardiology

## 2020-09-22 ENCOUNTER — Other Ambulatory Visit: Payer: Self-pay

## 2020-09-22 DIAGNOSIS — I2542 Coronary artery dissection: Secondary | ICD-10-CM | POA: Diagnosis present

## 2020-09-22 DIAGNOSIS — I214 Non-ST elevation (NSTEMI) myocardial infarction: Secondary | ICD-10-CM | POA: Insufficient documentation

## 2020-09-22 NOTE — Progress Notes (Signed)
Anita Potter reported having 4/10 chest discomfort on the treadmill. Exercise stopped. Blood pressure 122/78 heart rate 100. Anita Potter admits to not switching from a N-95 mask to a regular mask and may have gotten to hot. Telemetry rhythm Sinus rate 100. Patient taken to the treatment room. Anita Potter reported having a 2/10 continued chest discomfort. Oxygen placed on 2l/min then increased to 4l/min. 12 lead ECG obtained per standing chest pain order. Anita Potter said her chest discomfort went away. Repeat BP 128/85. Oxygen saturation 100% on room air. Vin Bhagat PAC paged and notified. Vin reviewed 12 lead ECG. Vin said that Anita Potter's 12 lead ECG look okay. Vin said that Anita Potter is okay to return to exercise on Friday and instructed to call Dr Hassell Done office if she has a re occurrence. Anita Potter left cardiac rehab without complaints or symptoms. Will continue to monitor the patient throughout  the program. Barnet Pall, RN,BSN 09/22/2020 4:56 PM

## 2020-09-24 ENCOUNTER — Encounter (HOSPITAL_COMMUNITY): Payer: BC Managed Care – PPO

## 2020-09-27 ENCOUNTER — Other Ambulatory Visit: Payer: Self-pay

## 2020-09-27 ENCOUNTER — Encounter (HOSPITAL_COMMUNITY)
Admission: RE | Admit: 2020-09-27 | Discharge: 2020-09-27 | Disposition: A | Discharge status: Home / Self Care | Payer: BC Managed Care – PPO | Source: Ambulatory Visit | Attending: Interventional Cardiology | Admitting: Interventional Cardiology

## 2020-09-27 DIAGNOSIS — I214 Non-ST elevation (NSTEMI) myocardial infarction: Secondary | ICD-10-CM | POA: Diagnosis not present

## 2020-09-27 DIAGNOSIS — I2542 Coronary artery dissection: Secondary | ICD-10-CM

## 2020-09-29 ENCOUNTER — Other Ambulatory Visit: Payer: Self-pay

## 2020-09-29 ENCOUNTER — Encounter (HOSPITAL_COMMUNITY)
Admission: RE | Admit: 2020-09-29 | Discharge: 2020-09-29 | Disposition: A | Discharge status: Home / Self Care | Payer: BC Managed Care – PPO | Source: Ambulatory Visit | Attending: Interventional Cardiology | Admitting: Interventional Cardiology

## 2020-09-29 DIAGNOSIS — I214 Non-ST elevation (NSTEMI) myocardial infarction: Secondary | ICD-10-CM | POA: Diagnosis not present

## 2020-09-29 DIAGNOSIS — I2542 Coronary artery dissection: Secondary | ICD-10-CM

## 2020-10-01 ENCOUNTER — Encounter (HOSPITAL_COMMUNITY)
Admission: RE | Admit: 2020-10-01 | Discharge: 2020-10-01 | Disposition: A | Discharge status: Home / Self Care | Payer: BC Managed Care – PPO | Source: Ambulatory Visit | Attending: Interventional Cardiology | Admitting: Interventional Cardiology

## 2020-10-01 ENCOUNTER — Other Ambulatory Visit: Payer: Self-pay

## 2020-10-01 DIAGNOSIS — I214 Non-ST elevation (NSTEMI) myocardial infarction: Secondary | ICD-10-CM

## 2020-10-01 DIAGNOSIS — I2542 Coronary artery dissection: Secondary | ICD-10-CM

## 2020-10-04 ENCOUNTER — Encounter (HOSPITAL_COMMUNITY): Payer: BC Managed Care – PPO

## 2020-10-06 ENCOUNTER — Encounter (HOSPITAL_COMMUNITY): Payer: BC Managed Care – PPO

## 2020-10-06 NOTE — Progress Notes (Signed)
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 Swallow whole., Disp: 30 tablet, Rfl: 11   NIFEdipine (ADALAT CC) 30 MG 24 hr tablet, Take 1 tablet (30 mg total) by mouth daily., Disp: 30 tablet, Rfl: 2   Prenatal Vit-Fe Fumarate-FA (MULTIVITAMIN-PRENATAL) 27-0.8 MG TABS tablet, Take 1 tablet by mouth daily at 12 noon., Disp: , Rfl:    sertraline (ZOLOFT) 100 MG tablet, Take 1 tablet by mouth daily., Disp: , Rfl:   Past Medical History: Past Medical History:  Diagnosis Date   Chronic hypertension 05/18/2019   History of gestational hypertension 06/15/2020   Hx of chlamydia infection 03/2016   Infection    UTI   Postpartum hypertension 07/09/2016   Pregnancy induced hypertension    meds PP for 6-8wks   Uterine fibroid     Tobacco Use: Social History   Tobacco Use  Smoking Status Every Day   Packs/day: 0.33   Types: Cigarettes  Smokeless Tobacco Never  Tobacco Comments   Patient given phone number for 1-800-quit-now    Labs: Recent Review Flowsheet Data     Labs for ITP  Cardiac and Pulmonary Rehab Latest Ref Rng & Units 09/22/2012 06/15/2020   Cholestrol 0 - 200 mg/dL - 182   LDLCALC 0 - 99 mg/dL - 126(H)   HDL >40 mg/dL - 41   Trlycerides <150 mg/dL - 75   Hemoglobin A1c 4.8 - 5.6 % - 5.5   TCO2 0 - 100 mmol/L 26 -       Capillary Blood Glucose: No results found for: GLUCAP   Exercise Target Goals: Exercise Program Goal: Individual exercise prescription set using results from initial 6 min walk test and THRR while considering  patient's activity barriers and safety.   Exercise Prescription Goal: Initial exercise prescription builds to 30-45 minutes a day of aerobic activity, 2-3 days per week.  Home exercise guidelines will be given to patient during program as part of exercise prescription that the participant will acknowledge.  Activity Barriers & Risk Stratification:  Activity Barriers & Cardiac Risk Stratification - 08/24/20 1100       Activity Barriers & Cardiac Risk Stratification   Activity Barriers None    Cardiac Risk Stratification High             6 Minute Walk:  6 Minute Walk     Row Name 08/24/20 1110         6 Minute Walk   Phase Initial     Distance 1400 feet     Walk Time 6 minutes     #  of Rest Breaks 0     MPH 2.65     METS 4.95     RPE 9     Perceived Dyspnea  0     VO2 Peak 17.34     Symptoms No     Resting HR 92 bpm     Resting BP 112/64     Resting Oxygen Saturation  99 %     Exercise Oxygen Saturation  during 6 min walk 97 %     Max Ex. HR 116 bpm     Max Ex. BP 128/82     2 Minute Post BP 122/76              Oxygen Initial Assessment:   Oxygen Re-Evaluation:   Oxygen Discharge (Final Oxygen Re-Evaluation):   Initial Exercise Prescription:  Initial Exercise Prescription - 08/24/20 1100       Date of Initial Exercise RX and Referring Provider   Date 08/24/20    Referring Provider Anita Booze, MD    Expected Discharge Date 10/22/20      Treadmill   MPH 2.5    Grade 0     Minutes 15    METs 2.91      NuStep   Level 3    SPM 85    Minutes 15    METs 2.5      Prescription Details   Frequency (times per week) 3    Duration Progress to 30 minutes of continuous aerobic without signs/symptoms of physical distress      Intensity   THRR 40-80% of Max Heartrate 75-150    Ratings of Perceived Exertion 11-13    Perceived Dyspnea 0-4      Progression   Progression Continue to progress workloads to maintain intensity without signs/symptoms of physical distress.      Resistance Training   Training Prescription Yes    Weight 3 lbs    Reps 10-15             Perform Capillary Blood Glucose checks as needed.  Exercise Prescription Changes:   Exercise Prescription Changes     Row Name 09/03/20 1100 09/06/20 1113 09/22/20 1104 10/01/20 1056       Response to Exercise   Blood Pressure (Admit) 124/74 126/68 116/70 126/70    Blood Pressure (Exercise) 128/80 130/78 122/78 140/60    Blood Pressure (Exit) 118/70 120/82 128/85 120/80    Heart Rate (Admit) 90 bpm 99 bpm 92 bpm 69 bpm    Heart Rate (Exercise) 106 bpm 113 bpm 115 bpm 120 bpm    Heart Rate (Exit) 81 bpm 85 bpm 75 bpm 81 bpm    Rating of Perceived Exertion (Exercise) _0 Perceived Dyspnea (Exercise) 0 -- -- --    Symptoms 0 none Chest discomfort none    Comments Pt first day of exercise -- Exercised stopped after w/u and 3 minutes of exercise due to chest discomfort. 12-lead EKG obtained. Cardiologist consulted. --    Duration Progress to 30 minutes of  aerobic without signs/symptoms of physical distress Progress to 30 minutes of  aerobic without signs/symptoms of physical distress Progress to 30 minutes of  aerobic without signs/symptoms of physical distress Progress to 30 minutes of  aerobic without signs/symptoms of physical distress    Intensity THRR unchanged THRR unchanged THRR unchanged THRR unchanged         Progression        Progression Continue to progress  workloads to  maintain intensity without signs/symptoms of physical distress. Continue to progress workloads to maintain intensity without signs/symptoms of physical distress. Continue to progress workloads to maintain intensity without signs/symptoms of physical distress. Continue to progress workloads to maintain intensity without signs/symptoms of physical distress.    Average METs 2.4 2.8 3.3 3.9         Resistance Training        Training Prescription Yes Yes No Yes    Weight 3 lbs 3 lbs -- 5 lbs    Reps 10-15 10-15 -- 10-15    Time 10 Minutes 10 Minutes -- 10 Minutes         Treadmill        MPH 2.5 2._0 Grade 0 _1 Minutes _2 METs 2.91 3.26 3.71 4.12         NuStep        Level 3 3 -- 4    SPM 85 85 -- 85    Minutes 15 15 -- 15    METs 2 2.4 -- 3.7         Home Exercise Plan        Plans to continue exercise at -- -- Home (comment)  Walking Home (comment)  Walking    Frequency -- -- Add 2 additional days to program exercise sessions. Add 2 additional days to program exercise sessions.    Initial Home Exercises Provided -- -- 09/13/20 09/13/20            Exercise Comments:   Exercise Comments     Row Name 09/03/20 1100 09/06/20 1100 09/13/20 1120 10/06/20 1131     Exercise Comments Pt first day in the CRP2 program. Pt tolerated exercise well. Will continue to monitor pt and progress workloads as tolerated without sign or symptom. Patient is progressing well with exercise. Increased incline on treadmill today. Reviewed home exercise guidelines and goals with patient. Patient absent today, unable to review METs and goals.             Exercise Goals and Review:   Exercise Goals     Row Name 08/24/20 1032             Exercise Goals   Increase Physical Activity Yes       Intervention Provide advice, education, support and counseling about physical activity/exercise needs.;Develop an individualized exercise prescription for aerobic and resistive  training based on initial evaluation findings, risk stratification, comorbidities and participant's personal goals.       Expected Outcomes Short Term: Attend rehab on a regular basis to increase amount of physical activity.;Long Term: Exercising regularly at least 3-5 days a week.;Long Term: Add in home exercise to make exercise part of routine and to increase amount of physical activity.       Increase Strength and Stamina Yes       Intervention Provide advice, education, support and counseling about physical activity/exercise needs.;Develop an individualized exercise prescription for aerobic and resistive training based on initial evaluation findings, risk stratification, comorbidities and participant's personal goals.       Expected Outcomes Short Term: Increase workloads from initial exercise prescription for resistance, speed, and METs.;Short Term: Perform resistance training exercises routinely during rehab and add in resistance training at home;Long Term: Improve cardiorespiratory fitness, muscular endurance and strength as measured by increased METs and functional capacity (6MWT)       Able to  understand and use rate of perceived exertion (RPE) scale Yes       Intervention Provide education and explanation on how to use RPE scale       Expected Outcomes Short Term: Able to use RPE daily in rehab to express subjective intensity level;Long Term:  Able to use RPE to guide intensity level when exercising independently       Knowledge and understanding of Target Heart Rate Range (THRR) Yes       Intervention Provide education and explanation of THRR including how the numbers were predicted and where they are located for reference       Expected Outcomes Short Term: Able to state/look up THRR;Long Term: Able to use THRR to govern intensity when exercising independently;Short Term: Able to use daily as guideline for intensity in rehab       Able to check pulse independently Yes       Intervention  Provide education and demonstration on how to check pulse in carotid and radial arteries.;Review the importance of being able to check your own pulse for safety during independent exercise       Expected Outcomes Short Term: Able to explain why pulse checking is important during independent exercise;Long Term: Able to check pulse independently and accurately       Understanding of Exercise Prescription Yes       Intervention Provide education, explanation, and written materials on patient's individual exercise prescription       Expected Outcomes Short Term: Able to explain program exercise prescription;Long Term: Able to explain home exercise prescription to exercise independently                Exercise Goals Re-Evaluation :  Exercise Goals Re-Evaluation     Row Name 09/03/20 1100 09/13/20 1120 10/06/20 1131         Exercise Goal Re-Evaluation   Exercise Goals Review Increase Physical Activity;Increase Strength and Stamina;Able to understand and use rate of perceived exertion (RPE) scale;Knowledge and understanding of Target Heart Rate Range (THRR);Understanding of Exercise Prescription Increase Physical Activity;Increase Strength and Stamina;Able to understand and use rate of perceived exertion (RPE) scale;Knowledge and understanding of Target Heart Rate Range (THRR);Understanding of Exercise Prescription --     Comments Pt first day in the CRP2 program. Pt tolerated exercise well and had an average MET level of 2.4. Pt understands RPE, THRR and exercise Rx. Reviewed home exercise guidelines with patient. Patient plans to walk as her mode of home exercise. Patient also has an elliptical machine at home that she can use for her execise once she builds her strength and stamina. Patient can check pulse with blood pressure cuff before and after exercise. Patient absent today, unable to review goals.     Expected Outcomes Will continue to monitor pt and progress workloads as tolerated without  sign or symptom. Patient will walk 30 minutes at least 2 days/week in addition to exercise at cardiac rehab to achieve 150 minutes of aerobic exercise per week. Patient will incorporate EFX into her exercise routine once she builds her strength and stamina. Patient will walk 30 minutes at least 2 days/week in addition to exercise at cardiac rehab to achieve 150 minutes of aerobic exercise per week. Patient will incorporate EFX into her exercise routine once she builds her strength and stamina.              Discharge Exercise Prescription (Final Exercise Prescription Changes):  Exercise Prescription Changes - 10/01/20 1056  Response to Exercise   Blood Pressure (Admit) 126/70    Blood Pressure (Exercise) 140/60    Blood Pressure (Exit) 120/80    Heart Rate (Admit) 69 bpm    Heart Rate (Exercise) 120 bpm    Heart Rate (Exit) 81 bpm    Rating of Perceived Exertion (Exercise) 12    Symptoms none    Duration Progress to 30 minutes of  aerobic without signs/symptoms of physical distress    Intensity THRR unchanged      Progression   Progression Continue to progress workloads to maintain intensity without signs/symptoms of physical distress.    Average METs 3.9      Resistance Training   Training Prescription Yes    Weight 5 lbs    Reps 10-15    Time 10 Minutes      Treadmill   MPH 3    Grade 2    Minutes 3    METs 4.12      NuStep   Level 4    SPM 85    Minutes 15    METs 3.7      Home Exercise Plan   Plans to continue exercise at Home (comment)   Walking   Frequency Add 2 additional days to program exercise sessions.    Initial Home Exercises Provided 09/13/20             Nutrition:  Target Goals: Understanding of nutrition guidelines, daily intake of sodium <1546m, cholesterol <2040m calories 30% from fat and 7% or less from saturated fats, daily to have 5 or more servings of fruits and vegetables.  Biometrics:  Pre Biometrics - 08/24/20 1030        Pre Biometrics   Waist Circumference 42 inches    Hip Circumference 46.5 inches    Waist to Hip Ratio 0.9 %    Triceps Skinfold 38 mm    % Body Fat 42.4 %    Grip Strength 39 kg    Flexibility 20 in    Single Leg Stand 30 seconds              Nutrition Therapy Plan and Nutrition Goals:  Nutrition Therapy & Goals - 10/04/20 1125       Nutrition Therapy   Diet TLC      Personal Nutrition Goals   Nutrition Goal Pt to build a healthy plate including vegetables, fruits, whole grains, and low-fat dairy products in a heart healthy meal plan.    Personal Goal #2 Pt to reduce sodium intake by reading labels and using salt substitutes      Intervention Plan   Intervention Prescribe, educate and counsel regarding individualized specific dietary modifications aiming towards targeted core components such as weight, hypertension, lipid management, diabetes, heart failure and other comorbidities.;Nutrition handout(s) given to patient.    Expected Outcomes Long Term Goal: Adherence to prescribed nutrition plan.;Short Term Goal: A plan has been developed with personal nutrition goals set during dietitian appointment.             Nutrition Assessments:  MEDIFICTS Score Key: ?70 Need to make dietary changes  40-70 Heart Healthy Diet ? 40 Therapeutic Level Cholesterol Diet   Flowsheet Row CARDIAC REHAB PHASE II EXERCISE [facility] Total Score on Admission 52      Picture Your Plate Scores: <4<23nhealthy dietary pattern with much room for improvement. 41-50 Dietary pattern unlikely to meet recommendations for good health  and room for improvement. 51-60 More healthful dietary pattern, with some room for improvement.  >60 Healthy dietary pattern, although there may be some specific behaviors that could be improved.    Nutrition Goals Re-Evaluation:  Nutrition Goals Re-Evaluation     [place] Name 10/04/20 1126              Goals   Current Weight 206 lb 2.1 oz (93.5 kg)       Nutrition Goal Pt to build a healthy plate including vegetables, fruits, whole grains, and low-fat dairy products in a heart healthy meal plan.               Personal Goal #2 Re-Evaluation     Personal Goal #2 Pt to reduce sodium intake by reading labels and using salt substitutes               Nutrition Goals Re-Evaluation:  Nutrition Goals Re-Evaluation     [place] Name 10/04/20 1126             Goals   Current Weight 206 lb 2.1 oz (93.5 kg)       Nutrition Goal Pt to build a healthy plate including vegetables, fruits, whole grains, and low-fat dairy products in a heart healthy meal plan.               Personal Goal #2 Re-Evaluation     Personal Goal #2 Pt to reduce sodium intake by reading labels and using salt substitutes               Nutrition Goals Discharge (Final Nutrition Goals Re-Evaluation):  Nutrition Goals Re-Evaluation - 10/04/20 1126       Goals   Current Weight 206 lb 2.1 oz (93.5 kg)    Nutrition Goal Pt to build a healthy plate including vegetables, fruits, whole grains, and low-fat dairy products in a heart healthy meal plan.      Personal Goal #2 Re-Evaluation   Personal Goal #2 Pt to reduce sodium intake by reading labels and using salt substitutes             Psychosocial: Target Goals: Acknowledge presence or absence of significant depression and/or stress, maximize coping skills, provide positive support system. Participant is able to verbalize types and ability to use techniques and skills needed for reducing stress and depression.  Initial Review & Psychosocial Screening:  Initial Psych Review & Screening - 08/24/20 1200       Initial Review   Current issues with Current Stress Concerns;Current Depression;Current Anxiety/Panic    Source of Stress Concerns Chronic Illness;Financial;Unable to perform yard/household activities;Unable to participate in former interests or  hobbies;Family    Comments Travonna has experienced a lot of stress, anxiety and reports having panic attacks. Kynsie is currently on an anitdepressant and is seeing a Social worker. Ligia has not had a a panic attack in over a month.      Family Dynamics   Good Support System? Yes   Erin Fulling has her husband, mother in law who is currently living with her and has a mother in law who lives near by     Barriers   Psychosocial barriers to participate in program The patient should benefit from training in stress management and relaxation.;Psychosocial barriers identified (see note)      Screening Interventions   Interventions Encouraged to exercise;To provide support and resources with identified psychosocial needs;Provide feedback about the scores to participant    Expected Outcomes Short Term goal: Identification and  review with participant of any Quality of Life or Depression concerns found by scoring the questionnaire.;Long Term Goal: Stressors or current issues are controlled or eliminated.;Short Term goal: Utilizing psychosocial counselor, staff and physician to assist with identification of specific Stressors or current issues interfering with healing process. Setting desired goal for each stressor or current issue identified.;Long Term goal: The participant improves quality of Life and PHQ9 Scores as seen by post scores and/or verbalization of changes             Quality of Life Scores:  Quality of Life - 08/24/20 1152       Quality of Life   Select Quality of Life      Quality of Life Scores   Health/Function Pre 14.83 %    Socioeconomic Pre 21.29 %    Psych/Spiritual Pre 19.64 %    Family Pre 24 %    GLOBAL Pre 18.5 %            Scores of 19 and below usually indicate a poorer quality of life in these areas.  A difference of  2-3 points is a clinically meaningful difference.  A difference of 2-3 points in the total score of the Quality of Life Index has been associated  with significant improvement in overall quality of life, self-image, physical symptoms, and general health in studies assessing change in quality of life.  PHQ-9: Recent Review Flowsheet Data     Depression screen [facility] 2/9 08/24/2020 10/30/2017 10/16/2017 09/18/2017 08/08/2017   Decreased Interest 0 0 1 1 0   Down, Depressed, Hopeless 1 0 0 0 0   PHQ - 2 Score 1 0 1 1 0   Altered sleeping - 0 2 1 0   Tired, decreased energy - 0 2 1 0   Change in appetite - 0 0 0 0   Feeling bad or failure about yourself  - 0 0 0 0   Trouble concentrating - 0 0 0 0   Moving slowly or fidgety/restless - 0 0 0 0   Suicidal thoughts - 0 0 0 0   PHQ-9 Score - 0 5 3 0      Interpretation of Total Score  Total Score Depression Severity:  1-4 = Minimal depression, 5-9 = Mild depression, 10-14 = Moderate depression, 15-19 = Moderately severe depression, 20-27 = Severe depression   Psychosocial Evaluation and Intervention:   Psychosocial Re-Evaluation:  Psychosocial Re-Evaluation     Row Name 09/07/20 1538 10/06/20 1637           Psychosocial Re-Evaluation   Current issues with Current Abuse or Neglect to Report;History of Depression;Current Anxiety/Panic Current Abuse or Neglect to Report;History of Depression;Current Anxiety/Panic      Comments Reviewed quality of life questionnaire with Jessicynt on 09/03/20 will forward to her primary care. Chia reports feeling better since she has been participating in cardiac rehab. Sherril Croon reports feeling better since she has been participating in cardiac rehab. Aliceson has not voiced having any increased stressors since participating in phase 2 cardiac rehab.      Expected Outcomes Aurielle will have decreased stressors/ depression upon completion of phase 2 cardiac rehab Dajana will have decreased stressors/ depression upon completion of phase 2 cardiac rehab      Interventions Encouraged to attend Cardiac Rehabilitation for the exercise;Stress management  education Encouraged to attend Cardiac Rehabilitation for the exercise;Stress management education      Continue Psychosocial Services  Follow up required by staff Follow up  required by staff               Psychosocial Discharge (Final Psychosocial Re-Evaluation):  Psychosocial Re-Evaluation - 10/06/20 1637       Psychosocial Re-Evaluation   Current issues with Current Abuse or Neglect to Report;History of Depression;Current Anxiety/Panic    Comments . Yarelie reports feeling better since she has been participating in cardiac rehab. Shannon has not voiced having any increased stressors since participating in phase 2 cardiac rehab.    Expected Outcomes Tikesha will have decreased stressors/ depression upon completion of phase 2 cardiac rehab    Interventions Encouraged to attend Cardiac Rehabilitation for the exercise;Stress management education    Continue Psychosocial Services  Follow up required by staff             Vocational Rehabilitation: Provide vocational rehab assistance to qualifying candidates.   Vocational Rehab Evaluation & Intervention:  Vocational Rehab - 08/24/20 1210       Initial Vocational Rehab Evaluation & Intervention   Assessment shows need for Vocational Rehabilitation No   Adrionna is cuurently on leave from her job and does not need vocational rehab at this time            Education: Education Goals: Education classes will be provided on a weekly basis, covering required topics. Participant will state understanding/return demonstration of topics presented.  Learning Barriers/Preferences:  Learning Barriers/Preferences - 08/24/20 1209       Learning Barriers/Preferences   Learning Barriers None    Learning Preferences Skilled Demonstration;Individual Instruction             Education Topics: Count Your Pulse:  -Group instruction provided by verbal instruction, demonstration, patient participation and written materials to support  subject.  Instructors address importance of being able to find your pulse and how to count your pulse when at home without a heart monitor.  Patients get hands on experience counting their pulse with staff help and individually.   Heart Attack, Angina, and Risk Factor Modification:  -Group instruction provided by verbal instruction, video, and written materials to support subject.  Instructors address signs and symptoms of angina and heart attacks.    Also discuss risk factors for heart disease and how to make changes to improve heart health risk factors.   Functional Fitness:  -Group instruction provided by verbal instruction, demonstration, patient participation, and written materials to support subject.  Instructors address safety measures for doing things around the house.  Discuss how to get up and down off the floor, how to pick things up properly, how to safely get out of a chair without assistance, and balance training.   Meditation and Mindfulness:  -Group instruction provided by verbal instruction, patient participation, and written materials to support subject.  Instructor addresses importance of mindfulness and meditation practice to help reduce stress and improve awareness.  Instructor also leads participants through a meditation exercise.    Stretching for Flexibility and Mobility:  -Group instruction provided by verbal instruction, patient participation, and written materials to support subject.  Instructors lead participants through series of stretches that are designed to increase flexibility thus improving mobility.  These stretches are additional exercise for major muscle groups that are typically performed during regular warm up and cool down.   Hands Only CPR:  -Group verbal, video, and participation provides a basic overview of AHA guidelines for community CPR. Role-play of emergencies allow participants the opportunity to practice calling for help and chest compression  technique with discussion of AED  use.   Hypertension: -Group verbal and written instruction that provides a basic overview of hypertension including the most recent diagnostic guidelines, risk factor reduction with self-care instructions and medication management.    Nutrition I class: Heart Healthy Eating:  -Group instruction provided by PowerPoint slides, verbal discussion, and written materials to support subject matter. The instructor gives an explanation and review of the Therapeutic Lifestyle Changes diet recommendations, which includes a discussion on lipid goals, dietary fat, sodium, fiber, plant stanol/sterol esters, sugar, and the components of a well-balanced, healthy diet.   Nutrition II class: Lifestyle Skills:  -Group instruction provided by PowerPoint slides, verbal discussion, and written materials to support subject matter. The instructor gives an explanation and review of label reading, grocery shopping for heart health, heart healthy recipe modifications, and ways to make healthier choices when eating out.   Diabetes Question & Answer:  -Group instruction provided by PowerPoint slides, verbal discussion, and written materials to support subject matter. The instructor gives an explanation and review of diabetes co-morbidities, pre- and post-prandial blood glucose goals, pre-exercise blood glucose goals, signs, symptoms, and treatment of hypoglycemia and hyperglycemia, and foot care basics.   Diabetes Blitz:  -Group instruction provided by PowerPoint slides, verbal discussion, and written materials to support subject matter. The instructor gives an explanation and review of the physiology behind type 1 and type 2 diabetes, diabetes medications and rational behind using different medications, pre- and post-prandial blood glucose recommendations and Hemoglobin A1c goals, diabetes diet, and exercise including blood glucose guidelines for exercising safely.    Portion Distortion:   -Group instruction provided by PowerPoint slides, verbal discussion, written materials, and food models to support subject matter. The instructor gives an explanation of serving size versus portion size, changes in portions sizes over the last 20 years, and what consists of a serving from each food group.   Stress Management:  -Group instruction provided by verbal instruction, video, and written materials to support subject matter.  Instructors review role of stress in heart disease and how to cope with stress positively.     Exercising on Your Own:  -Group instruction provided by verbal instruction, power point, and written materials to support subject.  Instructors discuss benefits of exercise, components of exercise, frequency and intensity of exercise, and end points for exercise.  Also discuss use of nitroglycerin and activating EMS.  Review options of places to exercise outside of rehab.  Review guidelines for sex with heart disease.   Cardiac Drugs I:  -Group instruction provided by verbal instruction and written materials to support subject.  Instructor reviews cardiac drug classes: antiplatelets, anticoagulants, beta blockers, and statins.  Instructor discusses reasons, side effects, and lifestyle considerations for each drug class.   Cardiac Drugs II:  -Group instruction provided by verbal instruction and written materials to support subject.  Instructor reviews cardiac drug classes: angiotensin converting enzyme inhibitors (ACE-I), angiotensin II receptor blockers (ARBs), nitrates, and calcium channel blockers.  Instructor discusses reasons, side effects, and lifestyle considerations for each drug class.   Anatomy and Physiology of the Circulatory System:  Group verbal and written instruction and models provide basic cardiac anatomy and physiology, with the coronary electrical and arterial systems. Review of: AMI, Angina, Valve disease, Heart Failure, Peripheral Artery Disease,  Cardiac Arrhythmia, Pacemakers, and the ICD.   Other Education:  -Group or individual verbal, written, or video instructions that support the educational goals of the cardiac rehab program.   Holiday Eating Survival Tips:  -Group instruction provided by PowerPoint  slides, verbal discussion, and written materials to support subject matter. The instructor gives patients tips, tricks, and techniques to help them not only survive but enjoy the holidays despite the onslaught of food that accompanies the holidays.   Knowledge Questionnaire Score:  Knowledge Questionnaire Score - 08/24/20 1152       Knowledge Questionnaire Score   Pre Score 26/28             Core Components/Risk Factors/Patient Goals at Admission:  Personal Goals and Risk Factors at Admission - 08/24/20 1150       Core Components/Risk Factors/Patient Goals on Admission    Weight Management Yes;Obesity;Weight Loss    Intervention Weight Management/Obesity: Establish reasonable short term and long term weight goals.;Obesity: Provide education and appropriate resources to help participant work on and attain dietary goals.    Admit Weight 201 lb 8 oz (91.4 kg)    Goal Weight: Short Term 195 lb (88.5 kg)    Goal Weight: Long Term 180 lb (81.6 kg)    Expected Outcomes Short Term: Continue to assess and modify interventions until short term weight is achieved;Long Term: Adherence to nutrition and physical activity/exercise program aimed toward attainment of established weight goal;Weight Loss: Understanding of general recommendations for a balanced deficit meal plan, which promotes 1-2 lb weight loss per week and includes a negative energy balance of [phone] kcal/d    Tobacco Cessation Yes    Number of packs per day 1/3    Intervention Assist the participant in steps to quit. Provide individualized education and counseling about committing to Tobacco Cessation, relapse prevention, and pharmacological support that can be  provided by physician.;Advice worker, assist with locating and accessing local/national Quit Smoking programs, and support quit date choice.    Expected Outcomes Short Term: Will demonstrate readiness to quit, by selecting a quit date.;Short Term: Will quit all tobacco product use, adhering to prevention of relapse plan.;Long Term: Complete abstinence from all tobacco products for at least 12 months from quit date.    Hypertension Yes    Intervention Provide education on lifestyle modifcations including regular physical activity/exercise, weight management, moderate sodium restriction and increased consumption of fresh fruit, vegetables, and low fat dairy, alcohol moderation, and smoking cessation.;Monitor prescription use compliance.    Expected Outcomes Short Term: Continued assessment and intervention until BP is < 140/35m HG in hypertensive participants. < 130/872mHG in hypertensive participants with diabetes, heart failure or chronic kidney disease.;Long Term: Maintenance of blood pressure at goal levels.    Stress Yes    Intervention Offer individual and/or small group education and counseling on adjustment to heart disease, stress management and health-related lifestyle change. Teach and support self-help strategies.;Refer participants experiencing significant psychosocial distress to appropriate mental health specialists for further evaluation and treatment. When possible, include family members and significant others in education/counseling sessions.    Expected Outcomes Short Term: Participant demonstrates changes in health-related behavior, relaxation and other stress management skills, ability to obtain effective social support, and compliance with psychotropic medications if prescribed.;Long Term: Emotional wellbeing is indicated by absence of clinically significant psychosocial distress or social isolation.             Core Components/Risk Factors/Patient Goals Review:    Goals and Risk Factor Review     Row Name 09/07/20 1542 10/06/20 1639           Core Components/Risk Factors/Patient Goals Review   Personal Goals Review Weight Management/Obesity;Stress;Hypertension;Lipids;Tobacco Cessation Weight Management/Obesity;Stress;Hypertension;Lipids;Tobacco Cessation  Review Gabrial is off to a great start to exercise. Vital signs have been stable. Jennipher will continue to work on smoking cessation Lillybeth continues to do well with exercise and has been able to increase her work loads. Vital signs have been stable. Declyn will continue to work on smoking cessation      Expected Outcomes Vasilisa will continue to participate in phase 2 cardiac rehab for exercise, nutrition and lifestyle modifications Joy will continue to participate in phase 2 cardiac rehab for exercise, nutrition and lifestyle modifications               Core Components/Risk Factors/Patient Goals at Discharge (Final Review):   Goals and Risk Factor Review - 10/06/20 1639       Core Components/Risk Factors/Patient Goals Review   Personal Goals Review Weight Management/Obesity;Stress;Hypertension;Lipids;Tobacco Cessation    Review Sheri continues to do well with exercise and has been able to increase her work loads. Vital signs have been stable. Marchetta will continue to work on smoking cessation    Expected Outcomes Samadhi will continue to participate in phase 2 cardiac rehab for exercise, nutrition and lifestyle modifications             ITP Comments:  ITP Comments     Row Name 08/24/20 1031 09/07/20 1537 10/06/20 1634       ITP Comments Medical Director- Dr. Fransico Him, MD 30 Day ITP Review. Leilani started exercise on 09/03/20 and is off to a good start to exercise 30 Day ITP Review. Kellen has good participation in phase 2 cardiac rehab when in attendance. Kaysee has family obligations with her family and newborn which sometimes prohibits her from  coming to exercise              Comments: See ITP Comments

## 2020-10-08 ENCOUNTER — Encounter (HOSPITAL_COMMUNITY): Payer: BC Managed Care – PPO

## 2020-10-08 ENCOUNTER — Telehealth (HOSPITAL_COMMUNITY): Payer: Self-pay | Admitting: *Deleted

## 2020-10-08 NOTE — Telephone Encounter (Signed)
Left message to call cardiac rehab 

## 2020-10-11 ENCOUNTER — Encounter (HOSPITAL_COMMUNITY): Payer: BC Managed Care – PPO

## 2020-10-11 ENCOUNTER — Telehealth (HOSPITAL_COMMUNITY): Payer: Self-pay | Admitting: *Deleted

## 2020-10-11 NOTE — Telephone Encounter (Signed)
Left message to let Anita Potter know that she may return to exercise at cardiac rehab today. I checked with infection prevention regarding her son having hand, foot and mouth disease as she has not symptoms.Barnet Pall, RN,BSN 10/11/2020 9:47 AM

## 2020-10-13 ENCOUNTER — Encounter (HOSPITAL_COMMUNITY): Payer: BC Managed Care – PPO

## 2020-10-15 ENCOUNTER — Encounter (HOSPITAL_COMMUNITY): Payer: BC Managed Care – PPO

## 2020-10-18 ENCOUNTER — Telehealth (HOSPITAL_COMMUNITY): Payer: Self-pay | Admitting: *Deleted

## 2020-10-18 ENCOUNTER — Encounter (HOSPITAL_COMMUNITY): Payer: BC Managed Care – PPO

## 2020-10-18 ENCOUNTER — Encounter (HOSPITAL_COMMUNITY): Payer: Self-pay | Admitting: *Deleted

## 2020-10-18 NOTE — Telephone Encounter (Signed)
Spoke with Anita Potter. Anita Potter and her family have all tested positive for COVID 19. Anita Potter said that she tested positive on 10/15/20. Advised the patient that she stay out for 10 days and can return to exercise at cardiac rehab as long as she is asymptomatic. Patient states understanding. Landry's cardiac rehab sessions have been extended to 11/12/20. Patient is agreeable to the plan.Barnet Pall, RN,BSN 10/18/2020 4:31 PM

## 2020-10-18 NOTE — Progress Notes (Signed)
Patient returned call regarding absence from cardiac rehab. Patient states that she and her family tested positive for COVID-19 on Friday 10/15/20. Per protocol, patient will stay out from cardiac rehab for 10 days, and may return on 10/27/20 if she is symptom free. Patient would like to extend her cardiac rehab program, and we will be able to accommodate her request.   Sol Passer, MS, ACSM CEP 10/18/2020 1626

## 2020-10-20 ENCOUNTER — Encounter (HOSPITAL_COMMUNITY): Payer: BC Managed Care – PPO

## 2020-10-22 ENCOUNTER — Encounter (HOSPITAL_COMMUNITY): Payer: BC Managed Care – PPO

## 2020-10-27 ENCOUNTER — Encounter (HOSPITAL_COMMUNITY)
Admission: RE | Admit: 2020-10-27 | Discharge: 2020-10-27 | Disposition: A | Discharge status: Home / Self Care | Payer: BC Managed Care – PPO | Source: Ambulatory Visit | Attending: Interventional Cardiology | Admitting: Interventional Cardiology

## 2020-10-27 ENCOUNTER — Other Ambulatory Visit: Payer: Self-pay

## 2020-10-27 DIAGNOSIS — I214 Non-ST elevation (NSTEMI) myocardial infarction: Secondary | ICD-10-CM

## 2020-10-27 DIAGNOSIS — Z5189 Encounter for other specified aftercare: Secondary | ICD-10-CM | POA: Diagnosis present

## 2020-10-27 DIAGNOSIS — I2542 Coronary artery dissection: Secondary | ICD-10-CM | POA: Insufficient documentation

## 2020-10-27 DIAGNOSIS — I252 Old myocardial infarction: Secondary | ICD-10-CM | POA: Insufficient documentation

## 2020-10-27 NOTE — Progress Notes (Signed)
Odyssey returned to exercise at cardiac rehab and exercised without difficulty.Barnet Pall, RN,BSN 10/27/2020 12:01 PM

## 2020-10-29 ENCOUNTER — Other Ambulatory Visit: Payer: Self-pay

## 2020-10-29 ENCOUNTER — Encounter (HOSPITAL_COMMUNITY)
Admission: RE | Admit: 2020-10-29 | Discharge: 2020-10-29 | Disposition: A | Discharge status: Home / Self Care | Payer: BC Managed Care – PPO | Source: Ambulatory Visit | Attending: Interventional Cardiology | Admitting: Interventional Cardiology

## 2020-10-29 DIAGNOSIS — Z5189 Encounter for other specified aftercare: Secondary | ICD-10-CM | POA: Diagnosis not present

## 2020-10-29 DIAGNOSIS — I214 Non-ST elevation (NSTEMI) myocardial infarction: Secondary | ICD-10-CM

## 2020-10-29 DIAGNOSIS — I2542 Coronary artery dissection: Secondary | ICD-10-CM

## 2020-11-01 ENCOUNTER — Other Ambulatory Visit: Payer: Self-pay

## 2020-11-01 ENCOUNTER — Encounter (HOSPITAL_COMMUNITY)
Admission: RE | Admit: 2020-11-01 | Discharge: 2020-11-01 | Disposition: A | Discharge status: Home / Self Care | Payer: BC Managed Care – PPO | Source: Ambulatory Visit | Attending: Interventional Cardiology | Admitting: Interventional Cardiology

## 2020-11-01 DIAGNOSIS — I214 Non-ST elevation (NSTEMI) myocardial infarction: Secondary | ICD-10-CM

## 2020-11-01 DIAGNOSIS — Z5189 Encounter for other specified aftercare: Secondary | ICD-10-CM | POA: Diagnosis not present

## 2020-11-01 DIAGNOSIS — I2542 Coronary artery dissection: Secondary | ICD-10-CM

## 2020-11-01 NOTE — Addendum Note (Signed)
Encounter addended by: Sol Passer on: 11/01/2020 4:23 PM  Actions taken: Flowsheet accepted

## 2020-11-01 NOTE — Progress Notes (Signed)
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 Swallow whole., Disp: 30 tablet, Rfl: 11   NIFEdipine (ADALAT CC) 30 MG 24 hr tablet, Take 1 tablet (30 mg total) by mouth daily., Disp: 30 tablet, Rfl: 2   Prenatal Vit-Fe Fumarate-FA (MULTIVITAMIN-PRENATAL) 27-0.8 MG TABS tablet, Take 1 tablet by mouth daily at 12 noon., Disp: , Rfl:    sertraline (ZOLOFT) 100 MG tablet, Take 1 tablet by mouth daily., Disp: , Rfl:   Past Medical History: Past Medical History:  Diagnosis Date   Chronic hypertension 05/18/2019   History of gestational hypertension 06/15/2020   Hx of chlamydia infection 03/2016   Infection    UTI   Postpartum hypertension 07/09/2016   Pregnancy induced hypertension    meds PP for 6-8wks   Uterine fibroid     Tobacco Use: Social History   Tobacco Use  Smoking Status Every Day   Packs/day: 0.33   Types: Cigarettes  Smokeless Tobacco Never  Tobacco Comments   Patient given phone number for 1-800-quit-now    Labs: Recent Review Flowsheet Data     Labs for ITP  Cardiac and Pulmonary Rehab Latest Ref Rng & Units 09/22/2012 06/15/2020   Cholestrol 0 - 200 mg/dL - 182   LDLCALC 0 - 99 mg/dL - 126(H)   HDL >40 mg/dL - 41   Trlycerides <150 mg/dL - 75   Hemoglobin A1c 4.8 - 5.6 % - 5.5   TCO2 0 - 100 mmol/L 26 -       Capillary Blood Glucose: No results found for: GLUCAP   Exercise Target Goals: Exercise Program Goal: Individual exercise prescription set using results from initial 6 min walk test and THRR while considering  patient's activity barriers and safety.   Exercise Prescription Goal: Initial exercise prescription builds to 30-45 minutes a day of aerobic activity, 2-3 days per week.  Home exercise guidelines will be given to patient during program as part of exercise prescription that the participant will acknowledge.  Activity Barriers & Risk Stratification:  Activity Barriers & Cardiac Risk Stratification - 08/24/20 1100       Activity Barriers & Cardiac Risk Stratification   Activity Barriers None    Cardiac Risk Stratification High             6 Minute Walk:  6 Minute Walk     Row Name 08/24/20 1110         6 Minute Walk   Phase Initial     Distance 1400 feet     Walk Time 6 minutes     #  of Rest Breaks 0     MPH 2.65     METS 4.95     RPE 9     Perceived Dyspnea  0     VO2 Peak 17.34     Symptoms No     Resting HR 92 bpm     Resting BP 112/64     Resting Oxygen Saturation  99 %     Exercise Oxygen Saturation  during 6 min walk 97 %     Max Ex. HR 116 bpm     Max Ex. BP 128/82     2 Minute Post BP 122/76              Oxygen Initial Assessment:   Oxygen Re-Evaluation:   Oxygen Discharge (Final Oxygen Re-Evaluation):   Initial Exercise Prescription:  Initial Exercise Prescription - 08/24/20 1100       Date of Initial Exercise RX and Referring Provider   Date 08/24/20    Referring Provider Jettie Booze, MD    Expected Discharge Date 10/22/20      Treadmill   MPH 2.5    Grade 0     Minutes 15    METs 2.91      NuStep   Level 3    SPM 85    Minutes 15    METs 2.5      Prescription Details   Frequency (times per week) 3    Duration Progress to 30 minutes of continuous aerobic without signs/symptoms of physical distress      Intensity   THRR 40-80% of Max Heartrate 75-150    Ratings of Perceived Exertion 11-13    Perceived Dyspnea 0-4      Progression   Progression Continue to progress workloads to maintain intensity without signs/symptoms of physical distress.      Resistance Training   Training Prescription Yes    Weight 3 lbs    Reps 10-15             Perform Capillary Blood Glucose checks as needed.  Exercise Prescription Changes:   Exercise Prescription Changes     Row Name 09/03/20 1100 09/06/20 1113 09/22/20 1104 10/01/20 1056 10/27/20 1059     Response to Exercise   Blood Pressure (Admit) 124/74 126/68 116/70 126/70 122/64   Blood Pressure (Exercise) 128/80 130/78 122/78 140/60 128/72   Blood Pressure (Exit) 118/70 120/82 128/85 120/80 130/70   Heart Rate (Admit) 90 bpm 99 bpm 92 bpm 69 bpm 83 bpm   Heart Rate (Exercise) 106 bpm 113 bpm 115 bpm 120 bpm 128 bpm   Heart Rate (Exit) 81 bpm 85 bpm 75 bpm 81 bpm 92 bpm   Rating of Perceived Exertion (Exercise) 9 11 11 12 12    Perceived Dyspnea (Exercise) 0 -- -- -- --   Symptoms 0 none Chest discomfort none none   Comments Pt first day of exercise -- Exercised stopped after w/u and 3 minutes of exercise due to chest discomfort. 12-lead EKG obtained. Cardiologist consulted. -- First session back after 3.5 weeks. Decreased workloads, tolerated fair.   Duration Progress to 30 minutes of  aerobic without signs/symptoms of physical distress Progress to 30 minutes of  aerobic without signs/symptoms of physical distress Progress to 30 minutes of  aerobic without signs/symptoms of physical distress Progress to 30 minutes of  aerobic without signs/symptoms of physical distress Progress to 30 minutes  of  aerobic without signs/symptoms of physical distress   Intensity THRR unchanged THRR unchanged  THRR unchanged THRR unchanged THRR unchanged     Progression   Progression Continue to progress workloads to maintain intensity without signs/symptoms of physical distress. Continue to progress workloads to maintain intensity without signs/symptoms of physical distress. Continue to progress workloads to maintain intensity without signs/symptoms of physical distress. Continue to progress workloads to maintain intensity without signs/symptoms of physical distress. Continue to progress workloads to maintain intensity without signs/symptoms of physical distress.   Average METs 2.4 2.8 3.3 3.9 3.4     Resistance Training   Training Prescription Yes Yes No Yes No   Weight 3 lbs 3 lbs -- 5 lbs --   Reps 10-15 10-15 -- 10-15 --   Time 10 Minutes 10 Minutes -- 10 Minutes --     Treadmill   MPH 2.5 2.$RemoveB'5 3 3 3   'rpSSooPq$ Grade 0 $Remov'1 1 2 1   'XiTufA$ Minutes $Remo'15 15 3 15 15   'FiJvN$ METs 2.91 3.26 3.71 4.12 3.71     NuStep   Level 3 3 -- 4 4   SPM 85 85 -- 85 85   Minutes 15 15 -- 15 15   METs 2 2.4 -- 3.7 3.2     Home Exercise Plan   Plans to continue exercise at -- -- Home (comment)  Walking Home (comment)  Walking Home (comment)  Walking   Frequency -- -- Add 2 additional days to program exercise sessions. Add 2 additional days to program exercise sessions. Add 2 additional days to program exercise sessions.   Initial Home Exercises Provided -- -- 09/13/20 09/13/20 09/13/20    Row Name 11/01/20 1100             Response to Exercise   Blood Pressure (Admit) 118/68       Blood Pressure (Exercise) 130/80       Blood Pressure (Exit) 122/80       Heart Rate (Admit) 91 bpm       Heart Rate (Exercise) 137 bpm       Heart Rate (Exit) 91 bpm       Rating of Perceived Exertion (Exercise) 11       Symptoms none       Duration Progress to 30 minutes of  aerobic without signs/symptoms of physical distress       Intensity THRR  unchanged               Progression   Progression Continue to progress workloads to maintain intensity without signs/symptoms of physical distress.       Average METs 4.2               Resistance Training   Training Prescription Yes       Weight 5 lbs       Reps 10-15       Time 10 Minutes               Treadmill   MPH 3.2       Grade 2       Minutes 15       METs 4.33               NuStep   Level 5       SPM 85       Minutes 15       METs 4               Home Exercise Plan   Plans to continue exercise at Home (comment)  Walking  Frequency Add 2 additional days to program exercise sessions.       Initial Home Exercises Provided 09/13/20                Exercise Comments:   Exercise Comments     Row Name 09/03/20 1100 09/06/20 1100 09/13/20 1120 10/06/20 1131 10/27/20 1200   Exercise Comments Pt first day in the CRP2 program. Pt tolerated exercise well. Will continue to monitor pt and progress workloads as tolerated without sign or symptom. Patient is progressing well with exercise. Increased incline on treadmill today. Reviewed home exercise guidelines and goals with patient. Patient absent today, unable to review METs and goals. Patient returned to exercise today and tolerated fairly well without symptoms. Decreased incline on treadmill, vital signs within normal limits.    [place] Name 11/01/20 1101           Exercise Comments Reviewed METs and goals with patient. Increased workload on TM.                Exercise Goals and Review:   Exercise Goals     Row Name 08/24/20 1032             Exercise Goals   Increase Physical Activity Yes       Intervention Provide advice, education, support and counseling about physical activity/exercise needs.;Develop an individualized exercise prescription for aerobic and resistive training based on initial evaluation findings, risk stratification, comorbidities and participant's personal goals.       Expected  Outcomes Short Term: Attend rehab on a regular basis to increase amount of physical activity.;Long Term: Exercising regularly at least 3-5 days a week.;Long Term: Add in home exercise to make exercise part of routine and to increase amount of physical activity.       Increase Strength and Stamina Yes       Intervention Provide advice, education, support and counseling about physical activity/exercise needs.;Develop an individualized exercise prescription for aerobic and resistive training based on initial evaluation findings, risk stratification, comorbidities and participant's personal goals.       Expected Outcomes Short Term: Increase workloads from initial exercise prescription for resistance, speed, and METs.;Short Term: Perform resistance training exercises routinely during rehab and add in resistance training at home;Long Term: Improve cardiorespiratory fitness, muscular endurance and strength as measured by increased METs and functional capacity (6MWT)       Able to understand and use rate of perceived exertion (RPE) scale Yes       Intervention Provide education and explanation on how to use RPE scale       Expected Outcomes Short Term: Able to use RPE daily in rehab to express subjective intensity level;Long Term:  Able to use RPE to guide intensity level when exercising independently       Knowledge and understanding of Target Heart Rate Range (THRR) Yes       Intervention Provide education and explanation of THRR including how the numbers were predicted and where they are located for reference       Expected Outcomes Short Term: Able to state/look up THRR;Long Term: Able to use THRR to govern intensity when exercising independently;Short Term: Able to use daily as guideline for intensity in rehab       Able to check pulse independently Yes       Intervention Provide education and demonstration on how to check pulse in carotid and radial arteries.;Review the importance of being able to check  your own pulse for safety during  independent exercise       Expected Outcomes Short Term: Able to explain why pulse checking is important during independent exercise;Long Term: Able to check pulse independently and accurately       Understanding of Exercise Prescription Yes       Intervention Provide education, explanation, and written materials on patient's individual exercise prescription       Expected Outcomes Short Term: Able to explain program exercise prescription;Long Term: Able to explain home exercise prescription to exercise independently                Exercise Goals Re-Evaluation :  Exercise Goals Re-Evaluation     Row Name 09/03/20 1100 09/13/20 1120 10/06/20 1131 10/27/20 1200 11/01/20 1101     Exercise Goal Re-Evaluation   Exercise Goals Review Increase Physical Activity;Increase Strength and Stamina;Able to understand and use rate of perceived exertion (RPE) scale;Knowledge and understanding of Target Heart Rate Range (THRR);Understanding of Exercise Prescription Increase Physical Activity;Increase Strength and Stamina;Able to understand and use rate of perceived exertion (RPE) scale;Knowledge and understanding of Target Heart Rate Range (THRR);Understanding of Exercise Prescription -- Increase Physical Activity;Increase Strength and Stamina;Able to understand and use rate of perceived exertion (RPE) scale;Knowledge and understanding of Target Heart Rate Range (THRR);Understanding of Exercise Prescription Increase Physical Activity;Increase Strength and Stamina;Able to understand and use rate of perceived exertion (RPE) scale;Knowledge and understanding of Target Heart Rate Range (THRR);Understanding of Exercise Prescription   Comments Pt first day in the CRP2 program. Pt tolerated exercise well and had an average MET level of 2.4. Pt understands RPE, THRR and exercise Rx. Reviewed home exercise guidelines with patient. Patient plans to walk as her mode of home exercise. Patient  also has an elliptical machine at home that she can use for her execise once she builds her strength and stamina. Patient can check pulse with blood pressure cuff before and after exercise. Patient absent today, unable to review goals. Patient returned to exercise today after being out sick. Patient tolerated exercise fairly well though she felt exercise was a little "rough". Decreased incline on treadmill. Vital signs within normal limits. Patient is progressing well since return to exericse. Increasing workloads appropriately. Patient is getting out and walking more at home. Patient currently has no questions or concerns about her exercise routine.   Expected Outcomes Will continue to monitor pt and progress workloads as tolerated without sign or symptom. Patient will walk 30 minutes at least 2 days/week in addition to exercise at cardiac rehab to achieve 150 minutes of aerobic exercise per week. Patient will incorporate EFX into her exercise routine once she builds her strength and stamina. Patient will walk 30 minutes at least 2 days/week in addition to exercise at cardiac rehab to achieve 150 minutes of aerobic exercise per week. Patient will incorporate EFX into her exercise routine once she builds her strength and stamina. Gradually progress workloads to rebuild stamina. Patient will increase walking at home in addition to exercise at cardiac rehab.            Discharge Exercise Prescription (Final Exercise Prescription Changes):  Exercise Prescription Changes - 11/01/20 1100       Response to Exercise   Blood Pressure (Admit) 118/68    Blood Pressure (Exercise) 130/80    Blood Pressure (Exit) 122/80    Heart Rate (Admit) 91 bpm    Heart Rate (Exercise) 137 bpm    Heart Rate (Exit) 91 bpm    Rating of Perceived Exertion (Exercise) 11  Symptoms none    Duration Progress to 30 minutes of  aerobic without signs/symptoms of physical distress    Intensity THRR unchanged      Progression    Progression Continue to progress workloads to maintain intensity without signs/symptoms of physical distress.    Average METs 4.2      Resistance Training   Training Prescription Yes    Weight 5 lbs    Reps 10-15    Time 10 Minutes      Treadmill   MPH 3.2    Grade 2    Minutes 15    METs 4.33      NuStep   Level 5    SPM 85    Minutes 15    METs 4      Home Exercise Plan   Plans to continue exercise at Home (comment)   Walking   Frequency Add 2 additional days to program exercise sessions.    Initial Home Exercises Provided 09/13/20             Nutrition:  Target Goals: Understanding of nutrition guidelines, daily intake of sodium '1500mg'$ , cholesterol '200mg'$ , calories 30% from fat and 7% or less from saturated fats, daily to have 5 or more servings of fruits and vegetables.  Biometrics:  Pre Biometrics - 08/24/20 1030       Pre Biometrics   Waist Circumference 42 inches    Hip Circumference 46.5 inches    Waist to Hip Ratio 0.9 %    Triceps Skinfold 38 mm    % Body Fat 42.4 %    Grip Strength 39 kg    Flexibility 20 in    Single Leg Stand 30 seconds              Nutrition Therapy Plan and Nutrition Goals:  Nutrition Therapy & Goals - 10/04/20 1125       Nutrition Therapy   Diet TLC      Personal Nutrition Goals   Nutrition Goal Pt to build a healthy plate including vegetables, fruits, whole grains, and low-fat dairy products in a heart healthy meal plan.    Personal Goal #2 Pt to reduce sodium intake by reading labels and using salt substitutes      Intervention Plan   Intervention Prescribe, educate and counsel regarding individualized specific dietary modifications aiming towards targeted core components such as weight, hypertension, lipid management, diabetes, heart failure and other comorbidities.;Nutrition handout(s) given to patient.    Expected Outcomes Long Term Goal: Adherence to prescribed nutrition plan.;Short Term Goal: A plan has  been developed with personal nutrition goals set during dietitian appointment.             Nutrition Assessments:  MEDIFICTS Score Key: ?70 Need to make dietary changes  40-70 Heart Healthy Diet ? 40 Therapeutic Level Cholesterol Diet   Flowsheet Row CARDIAC REHAB PHASE II EXERCISE from 09/03/2020 in [place]  Picture Your Plate Total Score on Admission 52      Picture Your Plate Scores: <14 Unhealthy dietary pattern with much room for improvement. 41-50 Dietary pattern unlikely to meet recommendations for good health and room for improvement. 51-60 More healthful dietary pattern, with some room for improvement.  >60 Healthy dietary pattern, although there may be some specific behaviors that could be improved.    Nutrition Goals Re-Evaluation:  Nutrition Goals Re-Evaluation     [place] Name 10/04/20 1126  Goals   Current Weight 206 lb 2.1 oz (93.5 kg)       Nutrition Goal Pt to build a healthy plate including vegetables, fruits, whole grains, and low-fat dairy products in a heart healthy meal plan.               Personal Goal #2 Re-Evaluation   Personal Goal #2 Pt to reduce sodium intake by reading labels and using salt substitutes                Nutrition Goals Re-Evaluation:  Nutrition Goals Re-Evaluation     [place] Name 10/04/20 1126             Goals   Current Weight 206 lb 2.1 oz (93.5 kg)       Nutrition Goal Pt to build a healthy plate including vegetables, fruits, whole grains, and low-fat dairy products in a heart healthy meal plan.               Personal Goal #2 Re-Evaluation   Personal Goal #2 Pt to reduce sodium intake by reading labels and using salt substitutes                Nutrition Goals Discharge (Final Nutrition Goals Re-Evaluation):  Nutrition Goals Re-Evaluation - 10/04/20 1126       Goals   Current Weight 206 lb 2.1 oz (93.5 kg)    Nutrition Goal Pt to build a healthy plate  including vegetables, fruits, whole grains, and low-fat dairy products in a heart healthy meal plan.      Personal Goal #2 Re-Evaluation   Personal Goal #2 Pt to reduce sodium intake by reading labels and using salt substitutes             Psychosocial: Target Goals: Acknowledge presence or absence of significant depression and/or stress, maximize coping skills, provide positive support system. Participant is able to verbalize types and ability to use techniques and skills needed for reducing stress and depression.  Initial Review & Psychosocial Screening:  Initial Psych Review & Screening - 08/24/20 1200       Initial Review   Current issues with Current Stress Concerns;Current Depression;Current Anxiety/Panic    Source of Stress Concerns Chronic Illness;Financial;Unable to perform yard/household activities;Unable to participate in former interests or hobbies;Family    Comments Tylee has experienced a lot of stress, anxiety and reports having panic attacks. Keyarra is currently on an anitdepressant and is seeing a Social worker. Lanelle has not had a a panic attack in over a month.      Family Dynamics   Good Support System? Yes   Erin Fulling has her husband, mother in law who is currently living with her and has a mother in law who lives near by     Barriers   Psychosocial barriers to participate in program The patient should benefit from training in stress management and relaxation.;Psychosocial barriers identified (see note)      Screening Interventions   Interventions Encouraged to exercise;To provide support and resources with identified psychosocial needs;Provide feedback about the scores to participant    Expected Outcomes Short Term goal: Identification and review with participant of any Quality of Life or Depression concerns found by scoring the questionnaire.;Long Term Goal: Stressors or current issues are controlled or eliminated.;Short Term goal: Utilizing psychosocial  counselor, staff and physician to assist with identification of specific Stressors or current issues interfering with healing process. Setting desired goal for each stressor or current issue identified.;Long Term goal: The participant  improves quality of Life and PHQ9 Scores as seen by post scores and/or verbalization of changes             Quality of Life Scores:  Quality of Life - 08/24/20 1152       Quality of Life   Select Quality of Life      Quality of Life Scores   Health/Function Pre 14.83 %    Socioeconomic Pre 21.29 %    Psych/Spiritual Pre 19.64 %    Family Pre 24 %    GLOBAL Pre 18.5 %            Scores of 19 and below usually indicate a poorer quality of life in these areas.  A difference of  2-3 points is a clinically meaningful difference.  A difference of 2-3 points in the total score of the Quality of Life Index has been associated with significant improvement in overall quality of life, self-image, physical symptoms, and general health in studies assessing change in quality of life.  PHQ-9: Recent Review Flowsheet Data     Depression screen [facility] 2/9 08/24/2020 10/30/2017 10/16/2017 09/18/2017 08/08/2017   Decreased Interest 0 0 1 1 0   Down, Depressed, Hopeless 1 0 0 0 0   PHQ - 2 Score 1 0 1 1 0   Altered sleeping - 0 2 1 0   Tired, decreased energy - 0 2 1 0   Change in appetite - 0 0 0 0   Feeling bad or failure about yourself  - 0 0 0 0   Trouble concentrating - 0 0 0 0   Moving slowly or fidgety/restless - 0 0 0 0   Suicidal thoughts - 0 0 0 0   PHQ-9 Score - 0 5 3 0      Interpretation of Total Score  Total Score Depression Severity:  1-4 = Minimal depression, 5-9 = Mild depression, 10-14 = Moderate depression, 15-19 = Moderately severe depression, 20-27 = Severe depression   Psychosocial Evaluation and Intervention:   Psychosocial Re-Evaluation:  Psychosocial Re-Evaluation     Row Name 09/07/20 1538 10/06/20 1637 10/28/20 1547          Psychosocial Re-Evaluation   Current issues with Current Abuse or Neglect to Report;History of Depression;Current Anxiety/Panic Current Abuse or Neglect to Report;History of Depression;Current Anxiety/Panic History of Depression;Current Anxiety/Panic     Comments Reviewed quality of life questionnaire with Jessicynt on 09/03/20 will forward to her primary care. Heiress reports feeling better since she has been participating in cardiac rehab. Sherril Croon reports feeling better since she has been participating in cardiac rehab. Faatimah has not voiced having any increased stressors since participating in phase 2 cardiac rehab. Sherril Croon reports feeling better since she has been participating in cardiac rehab. Harly has not voiced having any increased stressors since participating in phase 2 cardiac rehab. Despite being out recently to take care of her family who had the COVID 19 infection     Expected Outcomes Mardell will have decreased stressors/ depression upon completion of phase 2 cardiac rehab [place] will have decreased stressors/ depression upon completion of phase 2 cardiac rehab Murdis will have decreased stressors/ depression upon completion of phase 2 cardiac rehab     Interventions Encouraged to attend Cardiac Rehabilitation for the exercise;Stress management education Encouraged to attend Cardiac Rehabilitation for the exercise;Stress management education Encouraged to attend Cardiac Rehabilitation for the exercise;Stress management education     Continue Psychosocial Services  Follow up required  by staff Follow up required by staff Follow up required by staff              Psychosocial Discharge (Final Psychosocial Re-Evaluation):  Psychosocial Re-Evaluation - 10/28/20 1547       Psychosocial Re-Evaluation   Current issues with History of Depression;Current Anxiety/Panic    Comments . Toria reports feeling better since she has been participating in cardiac rehab. Marda has  not voiced having any increased stressors since participating in phase 2 cardiac rehab. Despite being out recently to take care of her family who had the COVID 19 infection    Expected Outcomes Brucha will have decreased stressors/ depression upon completion of phase 2 cardiac rehab    Interventions Encouraged to attend Cardiac Rehabilitation for the exercise;Stress management education    Continue Psychosocial Services  Follow up required by staff             Vocational Rehabilitation: Provide vocational rehab assistance to qualifying candidates.   Vocational Rehab Evaluation & Intervention:  Vocational Rehab - 08/24/20 1210       Initial Vocational Rehab Evaluation & Intervention   Assessment shows need for Vocational Rehabilitation No   Jorryn is cuurently on leave from her job and does not need vocational rehab at this time            Education: Education Goals: Education classes will be provided on a weekly basis, covering required topics. Participant will state understanding/return demonstration of topics presented.  Learning Barriers/Preferences:  Learning Barriers/Preferences - 08/24/20 1209       Learning Barriers/Preferences   Learning Barriers None    Learning Preferences Skilled Demonstration;Individual Instruction             Education Topics: Count Your Pulse:  -Group instruction provided by verbal instruction, demonstration, patient participation and written materials to support subject.  Instructors address importance of being able to find your pulse and how to count your pulse when at home without a heart monitor.  Patients get hands on experience counting their pulse with staff help and individually.   Heart Attack, Angina, and Risk Factor Modification:  -Group instruction provided by verbal instruction, video, and written materials to support subject.  Instructors address signs and symptoms of angina and heart attacks.    Also discuss risk  factors for heart disease and how to make changes to improve heart health risk factors.   Functional Fitness:  -Group instruction provided by verbal instruction, demonstration, patient participation, and written materials to support subject.  Instructors address safety measures for doing things around the house.  Discuss how to get up and down off the floor, how to pick things up properly, how to safely get out of a chair without assistance, and balance training.   Meditation and Mindfulness:  -Group instruction provided by verbal instruction, patient participation, and written materials to support subject.  Instructor addresses importance of mindfulness and meditation practice to help reduce stress and improve awareness.  Instructor also leads participants through a meditation exercise.    Stretching for Flexibility and Mobility:  -Group instruction provided by verbal instruction, patient participation, and written materials to support subject.  Instructors lead participants through series of stretches that are designed to increase flexibility thus improving mobility.  These stretches are additional exercise for major muscle groups that are typically performed during regular warm up and cool down.   Hands Only CPR:  -Group verbal, video, and participation provides a basic overview of AHA guidelines for community CPR. Role-play  of emergencies allow participants the opportunity to practice calling for help and chest compression technique with discussion of AED use.   Hypertension: -Group verbal and written instruction that provides a basic overview of hypertension including the most recent diagnostic guidelines, risk factor reduction with self-care instructions and medication management.    Nutrition I class: Heart Healthy Eating:  -Group instruction provided by PowerPoint slides, verbal discussion, and written materials to support subject matter. The instructor gives an explanation and  review of the Therapeutic Lifestyle Changes diet recommendations, which includes a discussion on lipid goals, dietary fat, sodium, fiber, plant stanol/sterol esters, sugar, and the components of a well-balanced, healthy diet.   Nutrition II class: Lifestyle Skills:  -Group instruction provided by PowerPoint slides, verbal discussion, and written materials to support subject matter. The instructor gives an explanation and review of label reading, grocery shopping for heart health, heart healthy recipe modifications, and ways to make healthier choices when eating out.   Diabetes Question & Answer:  -Group instruction provided by PowerPoint slides, verbal discussion, and written materials to support subject matter. The instructor gives an explanation and review of diabetes co-morbidities, pre- and post-prandial blood glucose goals, pre-exercise blood glucose goals, signs, symptoms, and treatment of hypoglycemia and hyperglycemia, and foot care basics.   Diabetes Blitz:  -Group instruction provided by PowerPoint slides, verbal discussion, and written materials to support subject matter. The instructor gives an explanation and review of the physiology behind type 1 and type 2 diabetes, diabetes medications and rational behind using different medications, pre- and post-prandial blood glucose recommendations and Hemoglobin A1c goals, diabetes diet, and exercise including blood glucose guidelines for exercising safely.    Portion Distortion:  -Group instruction provided by PowerPoint slides, verbal discussion, written materials, and food models to support subject matter. The instructor gives an explanation of serving size versus portion size, changes in portions sizes over the last 20 years, and what consists of a serving from each food group.   Stress Management:  -Group instruction provided by verbal instruction, video, and written materials to support subject matter.  Instructors review role of stress  in heart disease and how to cope with stress positively.     Exercising on Your Own:  -Group instruction provided by verbal instruction, power point, and written materials to support subject.  Instructors discuss benefits of exercise, components of exercise, frequency and intensity of exercise, and end points for exercise.  Also discuss use of nitroglycerin and activating EMS.  Review options of places to exercise outside of rehab.  Review guidelines for sex with heart disease.   Cardiac Drugs I:  -Group instruction provided by verbal instruction and written materials to support subject.  Instructor reviews cardiac drug classes: antiplatelets, anticoagulants, beta blockers, and statins.  Instructor discusses reasons, side effects, and lifestyle considerations for each drug class.   Cardiac Drugs II:  -Group instruction provided by verbal instruction and written materials to support subject.  Instructor reviews cardiac drug classes: angiotensin converting enzyme inhibitors (ACE-I), angiotensin II receptor blockers (ARBs), nitrates, and calcium channel blockers.  Instructor discusses reasons, side effects, and lifestyle considerations for each drug class.   Anatomy and Physiology of the Circulatory System:  Group verbal and written instruction and models provide basic cardiac anatomy and physiology, with the coronary electrical and arterial systems. Review of: AMI, Angina, Valve disease, Heart Failure, Peripheral Artery Disease, Cardiac Arrhythmia, Pacemakers, and the ICD.   Other Education:  -Group or individual verbal, written, or video instructions that support the  educational goals of the cardiac rehab program.   Holiday Eating Survival Tips:  -Group instruction provided by PowerPoint slides, verbal discussion, and written materials to support subject matter. The instructor gives patients tips, tricks, and techniques to help them not only survive but enjoy the holidays despite the onslaught  of food that accompanies the holidays.   Knowledge Questionnaire Score:  Knowledge Questionnaire Score - 08/24/20 1152       Knowledge Questionnaire Score   Pre Score 26/28             Core Components/Risk Factors/Patient Goals at Admission:  Personal Goals and Risk Factors at Admission - 08/24/20 1150       Core Components/Risk Factors/Patient Goals on Admission    Weight Management Yes;Obesity;Weight Loss    Intervention Weight Management/Obesity: Establish reasonable short term and long term weight goals.;Obesity: Provide education and appropriate resources to help participant work on and attain dietary goals.    Admit Weight 201 lb 8 oz (91.4 kg)    Goal Weight: Short Term 195 lb (88.5 kg)    Goal Weight: Long Term 180 lb (81.6 kg)    Expected Outcomes Short Term: Continue to assess and modify interventions until short term weight is achieved;Long Term: Adherence to nutrition and physical activity/exercise program aimed toward attainment of established weight goal;Weight Loss: Understanding of general recommendations for a balanced deficit meal plan, which promotes 1-2 lb weight loss per week and includes a negative energy balance of [phone] kcal/d    Tobacco Cessation Yes    Number of packs per day 1/3    Intervention Assist the participant in steps to quit. Provide individualized education and counseling about committing to Tobacco Cessation, relapse prevention, and pharmacological support that can be provided by physician.;Advice worker, assist with locating and accessing local/national Quit Smoking programs, and support quit date choice.    Expected Outcomes Short Term: Will demonstrate readiness to quit, by selecting a quit date.;Short Term: Will quit all tobacco product use, adhering to prevention of relapse plan.;Long Term: Complete abstinence from all tobacco products for at least 12 months from quit date.    Hypertension Yes    Intervention Provide  education on lifestyle modifcations including regular physical activity/exercise, weight management, moderate sodium restriction and increased consumption of fresh fruit, vegetables, and low fat dairy, alcohol moderation, and smoking cessation.;Monitor prescription use compliance.    Expected Outcomes Short Term: Continued assessment and intervention until BP is < 140/71mm HG in hypertensive participants. < 130/48mm HG in hypertensive participants with diabetes, heart failure or chronic kidney disease.;Long Term: Maintenance of blood pressure at goal levels.    Stress Yes    Intervention Offer individual and/or small group education and counseling on adjustment to heart disease, stress management and health-related lifestyle change. Teach and support self-help strategies.;Refer participants experiencing significant psychosocial distress to appropriate mental health specialists for further evaluation and treatment. When possible, include family members and significant others in education/counseling sessions.    Expected Outcomes Short Term: Participant demonstrates changes in health-related behavior, relaxation and other stress management skills, ability to obtain effective social support, and compliance with psychotropic medications if prescribed.;Long Term: Emotional wellbeing is indicated by absence of clinically significant psychosocial distress or social isolation.             Core Components/Risk Factors/Patient Goals Review:   Goals and Risk Factor Review     Row Name 09/07/20 1542 10/06/20 1639 10/28/20 1548         Core Components/Risk  Factors/Patient Goals Review   Personal Goals Review Weight Management/Obesity;Stress;Hypertension;Lipids;Tobacco Cessation Weight Management/Obesity;Stress;Hypertension;Lipids;Tobacco Cessation Weight Management/Obesity;Stress;Hypertension;Lipids;Tobacco Cessation     Review Toneka is off to a great start to exercise. Vital signs have been stable.  Osha will continue to work on smoking cessation Saleena continues to do well with exercise and has been able to increase her work loads. Vital signs have been stable. Catie will continue to work on smoking cessation Gradie continues to do well with exercise and has been able to increase her work loads. Vital signs have been stable. Aniesha will continue to work on smoking cessation. Markia returned to exercise on 10/27/20     Expected Outcomes Temia will continue to participate in phase 2 cardiac rehab for exercise, nutrition and lifestyle modifications Roshaunda will continue to participate in phase 2 cardiac rehab for exercise, nutrition and lifestyle modifications Astrid will continue to participate in phase 2 cardiac rehab for exercise, nutrition and lifestyle modifications              Core Components/Risk Factors/Patient Goals at Discharge (Final Review):   Goals and Risk Factor Review - 10/28/20 1548       Core Components/Risk Factors/Patient Goals Review   Personal Goals Review Weight Management/Obesity;Stress;Hypertension;Lipids;Tobacco Cessation    Review Annaleia continues to do well with exercise and has been able to increase her work loads. Vital signs have been stable. Kissy will continue to work on smoking cessation. Goldy returned to exercise on 10/27/20    Expected Outcomes Giovannina will continue to participate in phase 2 cardiac rehab for exercise, nutrition and lifestyle modifications             ITP Comments:  ITP Comments     Row Name 08/24/20 1031 09/07/20 1537 10/06/20 1634 10/28/20 1545     ITP Comments Medical Director- Dr. Fransico Him, MD 30 Day ITP Review. Kherington started exercise on 09/03/20 and is off to a good start to exercise 30 Day ITP Review. Farra has good participation in phase 2 cardiac rehab when in attendance. Safiyya has family obligations with her family and newborn which sometimes prohibits her from coming to exercise 30  Day ITP Review. Akeylah returned to exercise at cardiac rehab on 10/27/20 and did well wiht exercise after being absent to take care of her family/ children who had COVID 19.             Comments: See ITP Comments

## 2020-11-03 ENCOUNTER — Encounter (HOSPITAL_COMMUNITY)
Admission: RE | Admit: 2020-11-03 | Discharge: 2020-11-03 | Disposition: A | Discharge status: Home / Self Care | Payer: BC Managed Care – PPO | Source: Ambulatory Visit | Attending: Interventional Cardiology | Admitting: Interventional Cardiology

## 2020-11-03 ENCOUNTER — Other Ambulatory Visit: Payer: Self-pay

## 2020-11-03 DIAGNOSIS — I214 Non-ST elevation (NSTEMI) myocardial infarction: Secondary | ICD-10-CM

## 2020-11-03 DIAGNOSIS — I2542 Coronary artery dissection: Secondary | ICD-10-CM

## 2020-11-03 DIAGNOSIS — Z5189 Encounter for other specified aftercare: Secondary | ICD-10-CM | POA: Diagnosis not present

## 2020-11-03 NOTE — Progress Notes (Signed)
Devita's weight is 97.5 kg which is up 5.7 kg from when she first started cardiac rehab on 09/03/20.Marland Kitchen No peripheral edema noted. Oxygen saturation 100% on room air. Lung fields clear upon ausculation. Denies shortness of breath. Kathyjo was absent from cardiac rehab for more than two weeks due to her family having COVID 19 infection. Devorah says that she has been snacking more on nuts and has cut down to 4-5 cigarettes a day. Encouraged continued smoking cessation and to cut down on sugary drinks. Nataline says that she has been drinking more soda's and juice. Tenia just celebrated her 33 year old's birthday party this past weekend. Trenese says that she exercises 2 additional days a week besides cardiac rehab. Will ask the dietitian to talk with Nataleah next week.Will fax exercise flow sheets to Dr. Hassell Done  office for review.Barnet Pall, RN,BSN 11/03/2020 12:14 PM

## 2020-11-05 ENCOUNTER — Encounter (HOSPITAL_COMMUNITY)
Admission: RE | Admit: 2020-11-05 | Discharge: 2020-11-05 | Disposition: A | Discharge status: Home / Self Care | Payer: BC Managed Care – PPO | Source: Ambulatory Visit | Attending: Interventional Cardiology | Admitting: Interventional Cardiology

## 2020-11-05 ENCOUNTER — Other Ambulatory Visit: Payer: Self-pay

## 2020-11-05 DIAGNOSIS — Z5189 Encounter for other specified aftercare: Secondary | ICD-10-CM | POA: Diagnosis not present

## 2020-11-08 ENCOUNTER — Other Ambulatory Visit: Payer: Self-pay

## 2020-11-08 ENCOUNTER — Encounter (HOSPITAL_COMMUNITY)
Admission: RE | Admit: 2020-11-08 | Discharge: 2020-11-08 | Disposition: A | Discharge status: Home / Self Care | Payer: BC Managed Care – PPO | Source: Ambulatory Visit | Attending: Interventional Cardiology | Admitting: Interventional Cardiology

## 2020-11-08 DIAGNOSIS — Z5189 Encounter for other specified aftercare: Secondary | ICD-10-CM | POA: Diagnosis not present

## 2020-11-08 DIAGNOSIS — I214 Non-ST elevation (NSTEMI) myocardial infarction: Secondary | ICD-10-CM

## 2020-11-08 DIAGNOSIS — I2542 Coronary artery dissection: Secondary | ICD-10-CM

## 2020-11-08 NOTE — Progress Notes (Signed)
Nutrition Note  Spoke with pt per RN request due to weight gain of 5.7 kg. Pt is breastfeeding and trying to quit smoking. She has been snacking on nuts more often in effort to keep her hands busy so she does not smoke. We discussed snacks like popcorn and rice cakes that would be lower calorie for snacking. Encouraged pt to continue eating small portions of nuts for heart health. Pt also reports that she could reduce soda intake. She is focusing on drinking 64 oz water daily.  She continues to avoid salt shaker and is using DASH seasonings. She is not eating restaurant foods often.   Nutrition Diagnosis    Excessive mineral intake (sodium) related to lack of exposure to information as evidenced intake of high sodium foods   Nutrition Intervention  Pt's individual nutrition plan reviewed with pt. 1500-2000 mg sodium, plate method, adequate potassium intake, 2600 kcals to support breastfeeding, 118 g protein                        Pt given handouts for: ?HTN nutrition therapy Continue client-centered nutrition education by RD, as part of interdisciplinary care.   Goal(s) Pt to build a healthy plate including vegetables, fruits, whole grains, and low-fat dairy products in a heart healthy meal plan. Pt to reduce sodium intake by reading labels and using salt substitutes    Plan:    Will provide client-centered nutrition education as part of interdisciplinary care Monitor and evaluate progress toward nutrition goal with team.   Michaele Offer, MS, RDN, LDN, CDCES

## 2020-11-10 ENCOUNTER — Other Ambulatory Visit: Payer: Self-pay

## 2020-11-10 ENCOUNTER — Encounter (HOSPITAL_COMMUNITY)
Admission: RE | Admit: 2020-11-10 | Discharge: 2020-11-10 | Disposition: A | Discharge status: Home / Self Care | Payer: BC Managed Care – PPO | Source: Ambulatory Visit | Attending: Interventional Cardiology | Admitting: Interventional Cardiology

## 2020-11-10 DIAGNOSIS — I214 Non-ST elevation (NSTEMI) myocardial infarction: Secondary | ICD-10-CM

## 2020-11-10 DIAGNOSIS — Z5189 Encounter for other specified aftercare: Secondary | ICD-10-CM | POA: Diagnosis not present

## 2020-11-10 DIAGNOSIS — I2542 Coronary artery dissection: Secondary | ICD-10-CM

## 2020-11-10 NOTE — Progress Notes (Signed)
Discharge Progress Report  Patient Details  Name: Anita Potter MRN: [id number] Date of Birth: [birth year] Referring Provider:   Flowsheet Row CARDIAC REHAB PHASE II ORIENTATION from 08/24/2020 in [facility]  Referring Provider Jettie Booze, MD        Number of Visits: 18   Reason for Discharge:  Patient reached a stable level of exercise. Patient independent in their exercise. Patient has met program and personal goals.  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  ADL UCSD:   Initial Exercise Prescription:  Initial Exercise Prescription - 08/24/20 1100       Date of Initial Exercise RX and Referring Provider   Date 08/24/20    Referring Provider Jettie Booze, MD    Expected Discharge Date 10/22/20      Treadmill   MPH 2.5    Grade 0    Minutes 15    METs 2.91      NuStep   Level 3    SPM 85    Minutes 15    METs 2.5      Prescription Details   Frequency (times per week) 3    Duration Progress to 30 minutes of continuous aerobic without signs/symptoms of physical distress      Intensity   THRR 40-80% of Max Heartrate 75-150    Ratings of Perceived Exertion 11-13    Perceived Dyspnea 0-4      Progression   Progression Continue to progress workloads to maintain intensity without signs/symptoms of physical distress.      Resistance Training   Training Prescription Yes    Weight 3 lbs    Reps 10-15             Discharge Exercise Prescription (Final Exercise Prescription Changes):  Exercise Prescription Changes - 11/12/20 1108       Response to Exercise   Blood Pressure (Admit) 120/60    Blood Pressure (Exercise) 118/78    Blood Pressure (Exit)  112/62    Heart Rate (Admit) 90 bpm    Heart Rate (Exercise) 109 bpm    Heart Rate (Exit) 71 bpm    Rating of Perceived Exertion (Exercise) 11    Symptoms none    Duration Progress to 30 minutes of  aerobic without signs/symptoms of physical distress    Intensity THRR unchanged      Progression   Progression Continue to progress workloads to maintain intensity without signs/symptoms of physical distress.    Average METs 3.6      Resistance Training   Training Prescription Yes    Weight 5 lbs    Reps 10-15    Time 10 Minutes      NuStep   Level 5    SPM 85    Minutes 15    METs 3.3      Track   Laps 25    Minutes 15    METs 3.9      Home Exercise Plan   Plans to continue exercise at Home (comment)   Walking   Frequency Add 2 additional days to program exercise sessions.    Initial Home Exercises Provided 09/13/20  Functional Capacity:  6 Minute Walk     Row Name 08/24/20 1110 11/10/20 1104       6 Minute Walk   Phase Initial Discharge    Distance 1400 feet 1800 feet    Distance % Change -- 28.57 %    Distance Feet Change -- 400 ft    Walk Time 6 minutes 6 minutes    # of Rest Breaks 0 0    MPH 2.65 3.41    METS 4.95 5.32    RPE 9 9    Perceived Dyspnea  0 0    VO2 Peak 17.34 18.61    Symptoms No No    Resting HR 92 bpm 88 bpm    Resting BP 112/64 116/68    Resting Oxygen Saturation  99 % --    Exercise Oxygen Saturation  during 6 min walk 97 % --    Max Ex. HR 116 bpm 94 bpm    Max Ex. BP 128/82 122/78    2 Minute Post BP 122/76 104/76             Psychological, QOL, Others - Outcomes: PHQ 2/9: Depression screen [facility] 2/9 11/10/2020 08/24/2020 10/30/2017 10/16/2017 09/18/2017  Decreased Interest 0 0 0 1 1  Down, Depressed, Hopeless 0 1 0 0 0  PHQ - 2 Score 0 1 0 1 1  Altered sleeping - - 0 2 1  Tired, decreased energy - - 0 2 1  Change in appetite - - 0 0 0  Feeling bad or failure about yourself  - - 0 0 0  Trouble concentrating - -  0 0 0  Moving slowly or fidgety/restless - - 0 0 0  Suicidal thoughts - - 0 0 0  PHQ-9 Score - - 0 5 3  Some recent data might be hidden    Quality of Life:  Quality of Life - 11/10/20 1627       Quality of Life   Select Quality of Life      Quality of Life Scores   Health/Function Pre 14.83 %    Health/Function Post 23.4 %    Health/Function % Change 57.79 %    Socioeconomic Pre 21.29 %    Socioeconomic Post 23.93 %    Socioeconomic % Change  12.4 %    Psych/Spiritual Pre 19.64 %    Psych/Spiritual Post 25.5 %    Psych/Spiritual % Change 29.84 %    Family Pre 24 %    Family Post 28.8 %    Family % Change 20 %    GLOBAL Pre 18.5 %    GLOBAL Post 24.74 %    GLOBAL % Change 33.73 %             Personal Goals: Goals established at orientation with interventions provided to work toward goal.  Personal Goals and Risk Factors at Admission - 08/24/20 1150       Core Components/Risk Factors/Patient Goals on Admission    Weight Management Yes;Obesity;Weight Loss    Intervention Weight Management/Obesity: Establish reasonable short term and long term weight goals.;Obesity: Provide education and appropriate resources to help participant work on and attain dietary goals.    Admit Weight 201 lb 8 oz (91.4 kg)    Goal Weight: Short Term 195 lb (88.5 kg)    Goal Weight: Long Term 180 lb (81.6 kg)    Expected Outcomes Short Term: Continue to assess and modify interventions until short term weight is achieved;Long Term:  Adherence to nutrition and physical activity/exercise program aimed toward attainment of established weight goal;Weight Loss: Understanding of general recommendations for a balanced deficit meal plan, which promotes 1-2 lb weight loss per week and includes a negative energy balance of [phone] kcal/d    Tobacco Cessation Yes    Number of packs per day 1/3    Intervention Assist the participant in steps to quit. Provide individualized education and counseling about  committing to Tobacco Cessation, relapse prevention, and pharmacological support that can be provided by physician.;Advice worker, assist with locating and accessing local/national Quit Smoking programs, and support quit date choice.    Expected Outcomes Short Term: Will demonstrate readiness to quit, by selecting a quit date.;Short Term: Will quit all tobacco product use, adhering to prevention of relapse plan.;Long Term: Complete abstinence from all tobacco products for at least 12 months from quit date.    Hypertension Yes    Intervention Provide education on lifestyle modifcations including regular physical activity/exercise, weight management, moderate sodium restriction and increased consumption of fresh fruit, vegetables, and low fat dairy, alcohol moderation, and smoking cessation.;Monitor prescription use compliance.    Expected Outcomes Short Term: Continued assessment and intervention until BP is < 140/77m HG in hypertensive participants. < 130/837mHG in hypertensive participants with diabetes, heart failure or chronic kidney disease.;Long Term: Maintenance of blood pressure at goal levels.    Stress Yes    Intervention Offer individual and/or small group education and counseling on adjustment to heart disease, stress management and health-related lifestyle change. Teach and support self-help strategies.;Refer participants experiencing significant psychosocial distress to appropriate mental health specialists for further evaluation and treatment. When possible, include family members and significant others in education/counseling sessions.    Expected Outcomes Short Term: Participant demonstrates changes in health-related behavior, relaxation and other stress management skills, ability to obtain effective social support, and compliance with psychotropic medications if prescribed.;Long Term: Emotional wellbeing is indicated by absence of clinically significant psychosocial distress  or social isolation.              Personal Goals Discharge:  Goals and Risk Factor Review     Row Name 09/07/20 1542 10/06/20 1639 10/28/20 1548 11/23/20 1126       Core Components/Risk Factors/Patient Goals Review   Personal Goals Review Weight Management/Obesity;Stress;Hypertension;Lipids;Tobacco Cessation Weight Management/Obesity;Stress;Hypertension;Lipids;Tobacco Cessation Weight Management/Obesity;Stress;Hypertension;Lipids;Tobacco Cessation Weight Management/Obesity;Stress;Hypertension;Lipids;Tobacco Cessation    Review Gabriell is off to a great start to exercise. Vital signs have been stable. Jenney will continue to work on smoking cessation Adah continues to do well with exercise and has been able to increase her work loads. Vital signs have been stable. Mariadel will continue to work on smoking cessation Alfonso continues to do well with exercise and has been able to increase her work loads. Vital signs have been stable. Mikesha will continue to work on smoking cessation. Nesha returned to exercise on 10/27/20 Aneka continues to do well with exercise and has been able to increase her work loads. Vital signs have been stable. Leotha will continue to work on smoking cessation. Fayth completed cardiac rerhab on 11/12/20.    Expected Outcomes Kalissa will continue to participate in phase 2 cardiac rehab for exercise, nutrition and lifestyle modifications Dena will continue to participate in phase 2 cardiac rehab for exercise, nutrition and lifestyle modifications Tifani will continue to participate in phase 2 cardiac rehab for exercise, nutrition and lifestyle modifications Mical will continue to  exercise, nutrition and lifestyle modifications upon completion of phase 2 cardiac  rehab.             Exercise Goals and Review:  Exercise Goals     Row Name 08/24/20 1032             Exercise Goals   Increase Physical Activity Yes       Intervention  Provide advice, education, support and counseling about physical activity/exercise needs.;Develop an individualized exercise prescription for aerobic and resistive training based on initial evaluation findings, risk stratification, comorbidities and participant's personal goals.       Expected Outcomes Short Term: Attend rehab on a regular basis to increase amount of physical activity.;Long Term: Exercising regularly at least 3-5 days a week.;Long Term: Add in home exercise to make exercise part of routine and to increase amount of physical activity.       Increase Strength and Stamina Yes       Intervention Provide advice, education, support and counseling about physical activity/exercise needs.;Develop an individualized exercise prescription for aerobic and resistive training based on initial evaluation findings, risk stratification, comorbidities and participant's personal goals.       Expected Outcomes Short Term: Increase workloads from initial exercise prescription for resistance, speed, and METs.;Short Term: Perform resistance training exercises routinely during rehab and add in resistance training at home;Long Term: Improve cardiorespiratory fitness, muscular endurance and strength as measured by increased METs and functional capacity (6MWT)       Able to understand and use rate of perceived exertion (RPE) scale Yes       Intervention Provide education and explanation on how to use RPE scale       Expected Outcomes Short Term: Able to use RPE daily in rehab to express subjective intensity level;Long Term:  Able to use RPE to guide intensity level when exercising independently       Knowledge and understanding of Target Heart Rate Range (THRR) Yes       Intervention Provide education and explanation of THRR including how the numbers were predicted and where they are located for reference       Expected Outcomes Short Term: Able to state/look up THRR;Long Term: Able to use THRR to govern intensity  when exercising independently;Short Term: Able to use daily as guideline for intensity in rehab       Able to check pulse independently Yes       Intervention Provide education and demonstration on how to check pulse in carotid and radial arteries.;Review the importance of being able to check your own pulse for safety during independent exercise       Expected Outcomes Short Term: Able to explain why pulse checking is important during independent exercise;Long Term: Able to check pulse independently and accurately       Understanding of Exercise Prescription Yes       Intervention Provide education, explanation, and written materials on patient's individual exercise prescription       Expected Outcomes Short Term: Able to explain program exercise prescription;Long Term: Able to explain home exercise prescription to exercise independently                Exercise Goals Re-Evaluation:  Exercise Goals Re-Evaluation     Row Name 09/03/20 1100 09/13/20 1120 10/06/20 1131 10/27/20 1200 11/01/20 1101     Exercise Goal Re-Evaluation   Exercise Goals Review Increase Physical Activity;Increase Strength and Stamina;Able to understand and use rate of perceived exertion (RPE) scale;Knowledge and understanding of Target Heart Rate Range (THRR);Understanding of Exercise Prescription Increase Physical Activity;Increase  Strength and Stamina;Able to understand and use rate of perceived exertion (RPE) scale;Knowledge and understanding of Target Heart Rate Range (THRR);Understanding of Exercise Prescription -- Increase Physical Activity;Increase Strength and Stamina;Able to understand and use rate of perceived exertion (RPE) scale;Knowledge and understanding of Target Heart Rate Range (THRR);Understanding of Exercise Prescription Increase Physical Activity;Increase Strength and Stamina;Able to understand and use rate of perceived exertion (RPE) scale;Knowledge and understanding of Target Heart Rate Range  (THRR);Understanding of Exercise Prescription   Comments Pt first day in the [facility] program. Pt tolerated exercise well and had an average MET level of 2.4. Pt understands RPE, THRR and exercise Rx. Reviewed home exercise guidelines with patient. Patient plans to walk as her mode of home exercise. Patient also has an elliptical machine at home that she can use for her execise once she builds her strength and stamina. Patient can check pulse with blood pressure cuff before and after exercise. Patient absent today, unable to review goals. Patient returned to exercise today after being out sick. Patient tolerated exercise fairly well though she felt exercise was a little "rough". Decreased incline on treadmill. Vital signs within normal limits. Patient is progressing well since return to exericse. Increasing workloads appropriately. Patient is getting out and walking more at home. Patient currently has no questions or concerns about her exercise routine.   Expected Outcomes Will continue to monitor pt and progress workloads as tolerated without sign or symptom. Patient will walk 30 minutes at least 2 days/week in addition to exercise at cardiac rehab to achieve 150 minutes of aerobic exercise per week. Patient will incorporate EFX into her exercise routine once she builds her strength and stamina. Patient will walk 30 minutes at least 2 days/week in addition to exercise at cardiac rehab to achieve 150 minutes of aerobic exercise per week. Patient will incorporate EFX into her exercise routine once she builds her strength and stamina. Gradually progress workloads to rebuild stamina. Patient will increase walking at home in addition to exercise at cardiac rehab.    [place] Name 11/10/20 1117 11/12/20 1200           Exercise Goal Re-Evaluation   Exercise Goals Review Increase Physical Activity;Increase Strength and Stamina;Able to understand and use rate of perceived exertion (RPE) scale;Knowledge and understanding of  Target Heart Rate Range (THRR);Understanding of Exercise Prescription Increase Physical Activity;Increase Strength and Stamina;Able to understand and use rate of perceived exertion (RPE) scale;Knowledge and understanding of Target Heart Rate Range (THRR);Understanding of Exercise Prescription      Comments Patient is scheduled to complete the cardiac rehab program on Friday 11/12/20. Patient's functional capacity increased 28% as measured by 6MWT, flexibility increased 2.5%. Patient feels her strength is about the same. Patient has 5 lbs hand weights at home and exercise bands at home that she plans to use for her resistance training. Patient is also looking into getting a kettle bell. Patient does yoga workouts from YouTube and walks as her mode of exericse. Patient completed the cardiac rehab program and progressed well achieving 4.2 METs with exercise. Patient will continue walking at home and has 5 lb hand weights to use for resistance training.      Expected Outcomes Patient will continue aerobic, resistance and yoga exercises to maintain health and fitness gains. Patient will continue aerobic, resistance and yoga exercises to maintain health and fitness gains.               Nutrition & Weight - Outcomes:  Pre Biometrics - 08/24/20 1030  Pre Biometrics   Waist Circumference 42 inches    Hip Circumference 46.5 inches    Waist to Hip Ratio 0.9 %    Triceps Skinfold 38 mm    % Body Fat 42.4 %    Grip Strength 39 kg    Flexibility 20 in    Single Leg Stand 30 seconds             Post Biometrics - 11/12/20 1103        Post  Biometrics   Height 5' 6.5" (1.689 m)    Waist Circumference 40 inches    Hip Circumference 47 inches    Waist to Hip Ratio 0.85 %    BMI (Calculated) 33.76    Triceps Skinfold 48 mm    % Body Fat 43.8 %    Grip Strength 33.5 kg   *Different dynamometer used   Flexibility 20.5 in    Single Leg Stand 30 seconds             Nutrition:   Nutrition Therapy & Goals - 10/04/20 1125       Nutrition Therapy   Diet TLC      Personal Nutrition Goals   Nutrition Goal Pt to build a healthy plate including vegetables, fruits, whole grains, and low-fat dairy products in a heart healthy meal plan.    Personal Goal #2 Pt to reduce sodium intake by reading labels and using salt substitutes      Intervention Plan   Intervention Prescribe, educate and counsel regarding individualized specific dietary modifications aiming towards targeted core components such as weight, hypertension, lipid management, diabetes, heart failure and other comorbidities.;Nutrition handout(s) given to patient.    Expected Outcomes Long Term Goal: Adherence to prescribed nutrition plan.;Short Term Goal: A plan has been developed with personal nutrition goals set during dietitian appointment.             Nutrition Discharge:   Education Questionnaire Score:  Knowledge Questionnaire Score - 11/10/20 1627       Knowledge Questionnaire Score   Pre Score 26/28    Post Score 28/28             Goals reviewed with patient; copy given to patient.Pt graduated from cardiac rehab program today with completion of  exercise sessions in Phase II. Pt maintained good attendance and progressed nicely during his participation in rehab as evidenced by increased MET level.   Medication list reconciled. Repeat  PHQ score- 0 .  Pt has made significant lifestyle changes and should be commended for her success. Pt feels she has achieved his goals during cardiac rehab.   Pt plans to continue exercise by using hand weights, using her elliptical and doing yoga. Salah says she feels better and is eating healthier. Frank increased her distance on her post exercise walk test by 400 feet and reports feeling stronger. Lyssa does continue to smoke and did gain weight while in the program. Dewayne says she plans to cut back on cigarettes and sugary drinks. We are proud of  Quilla's progress!Barnet Pall, RN,BSN 11/23/2020 11:34 AM

## 2020-11-12 ENCOUNTER — Other Ambulatory Visit: Payer: Self-pay

## 2020-11-12 ENCOUNTER — Encounter (HOSPITAL_COMMUNITY)
Admission: RE | Admit: 2020-11-12 | Discharge: 2020-11-12 | Disposition: A | Discharge status: Home / Self Care | Payer: BC Managed Care – PPO | Source: Ambulatory Visit | Attending: Interventional Cardiology | Admitting: Interventional Cardiology

## 2020-11-12 VITALS — BP 120/60 | HR 90 | Ht 66.5 in | Wt 212.3 lb

## 2020-11-12 DIAGNOSIS — I214 Non-ST elevation (NSTEMI) myocardial infarction: Secondary | ICD-10-CM

## 2020-11-12 DIAGNOSIS — Z5189 Encounter for other specified aftercare: Secondary | ICD-10-CM | POA: Diagnosis not present

## 2020-11-12 DIAGNOSIS — I2542 Coronary artery dissection: Secondary | ICD-10-CM

## 2021-05-20 ENCOUNTER — Emergency Department (HOSPITAL_COMMUNITY)
Admission: EM | Admit: 2021-05-20 | Discharge: 2021-05-20 | Discharge status: Against Medical Advice | Payer: BC Managed Care – PPO

## 2021-05-20 NOTE — ED Triage Notes (Signed)
[place] , tripped and fell on her knees this evening reports left knee pain with swelling . No LOC , pain increases with movement /weight bearing .  ?

## 2021-05-20 NOTE — ED Notes (Signed)
Pt husband stated she wants to leave and just go home, husband rolled pt out in wheelchair. ?

## 2021-10-05 ENCOUNTER — Emergency Department (HOSPITAL_BASED_OUTPATIENT_CLINIC_OR_DEPARTMENT_OTHER)
Admission: EM | Admit: 2021-10-05 | Discharge: 2021-10-05 | Disposition: A | Discharge status: Home / Self Care | Payer: Managed Care, Other (non HMO) | Attending: Emergency Medicine | Admitting: Emergency Medicine

## 2021-10-05 ENCOUNTER — Encounter (HOSPITAL_BASED_OUTPATIENT_CLINIC_OR_DEPARTMENT_OTHER): Payer: Self-pay | Admitting: Obstetrics and Gynecology

## 2021-10-05 ENCOUNTER — Other Ambulatory Visit: Payer: Self-pay

## 2021-10-05 ENCOUNTER — Emergency Department (HOSPITAL_BASED_OUTPATIENT_CLINIC_OR_DEPARTMENT_OTHER): Payer: Managed Care, Other (non HMO) | Admitting: Radiology

## 2021-10-05 DIAGNOSIS — Z7982 Long term (current) use of aspirin: Secondary | ICD-10-CM | POA: Diagnosis not present

## 2021-10-05 DIAGNOSIS — R0789 Other chest pain: Secondary | ICD-10-CM | POA: Insufficient documentation

## 2021-10-05 DIAGNOSIS — Z79899 Other long term (current) drug therapy: Secondary | ICD-10-CM | POA: Diagnosis not present

## 2021-10-05 DIAGNOSIS — I10 Essential (primary) hypertension: Secondary | ICD-10-CM | POA: Diagnosis not present

## 2021-10-05 DIAGNOSIS — R079 Chest pain, unspecified: Secondary | ICD-10-CM | POA: Diagnosis present

## 2021-10-05 LAB — CBC WITH DIFFERENTIAL/PLATELET
Abs Immature Granulocytes: 0.03 10*3/uL (ref 0.00–0.07)
Basophils Absolute: 0 10*3/uL (ref 0.0–0.1)
Basophils Relative: 0 %
Eosinophils Absolute: 0.2 10*3/uL (ref 0.0–0.5)
Eosinophils Relative: 2 %
HCT: 39.6 % (ref 36.0–46.0)
Hemoglobin: 13.6 g/dL (ref 12.0–15.0)
Immature Granulocytes: 0 %
Lymphocytes Relative: 32 %
Lymphs Abs: 3.2 10*3/uL (ref 0.7–4.0)
MCH: 30.4 pg (ref 26.0–34.0)
MCHC: 34.3 g/dL (ref 30.0–36.0)
MCV: 88.6 fL (ref 80.0–100.0)
Monocytes Absolute: 0.6 10*3/uL (ref 0.1–1.0)
Monocytes Relative: 6 %
Neutro Abs: 5.9 10*3/uL (ref 1.7–7.7)
Neutrophils Relative %: 60 %
Platelets: 247 10*3/uL (ref 150–400)
RBC: 4.47 MIL/uL (ref 3.87–5.11)
RDW: 12.2 % (ref 11.5–15.5)
WBC: 10 10*3/uL (ref 4.0–10.5)
nRBC: 0 % (ref 0.0–0.2)

## 2021-10-05 LAB — COMPREHENSIVE METABOLIC PANEL
ALT: 10 U/L (ref 0–44)
AST: 14 U/L — ABNORMAL LOW (ref 15–41)
Albumin: 4.6 g/dL (ref 3.5–5.0)
Alkaline Phosphatase: 78 U/L (ref 38–126)
Anion gap: 9 (ref 5–15)
BUN: 6 mg/dL (ref 6–20)
CO2: 27 mmol/L (ref 22–32)
Calcium: 9.8 mg/dL (ref 8.9–10.3)
Chloride: 101 mmol/L (ref 98–111)
Creatinine, Ser: 0.64 mg/dL (ref 0.44–1.00)
GFR, Estimated: >60 mL/min (ref >60)
Glucose, Bld: 111 mg/dL — ABNORMAL HIGH (ref 70–99)
Potassium: 3.8 mmol/L (ref 3.5–5.1)
Sodium: 137 mmol/L (ref 135–145)
Total Bilirubin: 0.2 mg/dL — ABNORMAL LOW (ref 0.3–1.2)
Total Protein: 7.7 g/dL (ref 6.5–8.1)

## 2021-10-05 LAB — LIPASE, BLOOD: Lipase: 25 U/L (ref 11–51)

## 2021-10-05 LAB — TROPONIN I (HIGH SENSITIVITY)
Troponin I (High Sensitivity): 2 ng/L (ref <18)
Troponin I (High Sensitivity): 2 ng/L (ref <18)

## 2021-10-05 LAB — HCG, SERUM, QUALITATIVE: Preg, Serum: NEGATIVE

## 2021-10-05 MED ORDER — LORAZEPAM 2 MG/ML IJ SOLN: 1.0000 mg | Freq: Once | INTRAMUSCULAR | Status: DC

## 2021-10-05 NOTE — ED Triage Notes (Signed)
Patient reports to the ER for chest pain that she reports started this morning. Patient reports she has a dull pain that is not going away. Denies shortness of breath

## 2021-10-05 NOTE — Discharge Instructions (Signed)
Your work-up today was reassuring.  As discussed take 1 mg of Xanax tonight.  Your symptoms did improve after you took Xanax this morning.  You did not want a dose of anxiety medication in the emergency room today.  Your heart enzyme, EKG did not show any evidence of heart attack.  Recommend you follow-up with your cardiologist.  I have put in a referral to cardiology clinic to the cardiologist you saw after your episode last year.  They should give you a call in the next day or 2 however if you do not receive this call please call their clinic to schedule this appointment.  Return to the emergency room for any worsening symptoms.

## 2021-10-05 NOTE — ED Provider Notes (Signed)
[facility] Provider Note   CSN: [id number] Arrival date & time: 10/05/21  1214     History  Chief Complaint  Patient presents with   Chest Pain    Anita Potter is a 36 y.o. female.  36 year old female with past medical history significant for hypertension, SCAD presents today for evaluation of chest pain.  States she has had intermittent chest pain since Saturday that was mild but significantly worsened while she was at work today.  She states the pain was 10/10 this morning.  She took Xanax and ibuprofen and had slight improvement to 7/10.  Pain is remained at that level since then.  Pain does not radiate, and is not associated with shortness of breath, lightheadedness, or palpitations.  States this does not feel similar to when she had her NSTEMI last year.  States with that she felt quite nauseous along with her chest pain.  States this feels similar to when her anxiety kicks in.   The history is provided by the patient. No language interpreter was used.       Home Medications Prior to Admission medications   Medication Sig Start Date End Date Taking? Authorizing Provider  acetaminophen (TYLENOL) 325 MG tablet Take 2 tablets (650 mg total) by mouth every 4 (four) hours as needed (for pain scale < 4). 11/02/17   Aletha Halim, MD  ALPRAZolam Duanne Moron) 0.5 MG tablet Take 0.5 mg by mouth 3 (three) times daily as needed. 07/01/20   [provider]  aspirin EC 81 MG EC tablet Take 1 tablet (81 mg total) by mouth daily. Swallow whole. 06/17/20   Barb Merino, MD  NIFEdipine (ADALAT CC) 30 MG 24 hr tablet Take 1 tablet (30 mg total) by mouth daily. 06/16/20   Barb Merino, MD  Prenatal Vit-Fe Fumarate-FA (MULTIVITAMIN-PRENATAL) 27-0.8 MG TABS tablet Take 1 tablet by mouth daily at 12 noon.    [provider]  sertraline (ZOLOFT) 100 MG tablet Take 1 tablet by mouth daily. 07/27/20   [provider]      Allergies    Patient  has no known allergies.    Review of Systems   Review of Systems  Constitutional:  Negative for chills and fever.  Respiratory:  Negative for shortness of breath.   Cardiovascular:  Positive for chest pain. Negative for palpitations and leg swelling.  Gastrointestinal:  Negative for abdominal pain, nausea and vomiting.  Neurological:  Negative for syncope and light-headedness.  All other systems reviewed and are negative.   Physical Exam Updated Vital Signs BP 131/83 (BP Location: Left Arm)   Pulse 74   Temp 98.6 F (37 C) (Oral)   Resp (!) 22   Ht '5\' 5"'$  (1.651 m)   Wt 97.5 kg   LMP 09/05/2021 (Approximate)   SpO2 97%   Breastfeeding Yes   BMI 35.78 kg/m  Physical Exam Vitals and nursing note reviewed.  Constitutional:      General: She is not in acute distress.    Appearance: Normal appearance. She is not ill-appearing.  HENT:     Head: Normocephalic and atraumatic.     Nose: Nose normal.  Eyes:     General: No scleral icterus.    Extraocular Movements: Extraocular movements intact.     Conjunctiva/sclera: Conjunctivae normal.  Cardiovascular:     Rate and Rhythm: Normal rate and regular rhythm.     Pulses: Normal pulses.  Pulmonary:     Effort: Pulmonary effort is normal. No respiratory distress.  Breath sounds: Normal breath sounds. No wheezing or rales.  Abdominal:     General: There is no distension.     Palpations: Abdomen is soft.     Tenderness: There is no abdominal tenderness. There is no guarding.  Musculoskeletal:        General: Normal range of motion.     Cervical back: Normal range of motion.     Right lower leg: No edema.     Left lower leg: No edema.  Skin:    General: Skin is warm and dry.  Neurological:     General: No focal deficit present.     Mental Status: She is alert. Mental status is at baseline.     ED Results / Procedures / Treatments   Labs (all labs ordered are listed, but only abnormal results are displayed) Labs  Reviewed  CBC WITH DIFFERENTIAL/PLATELET  COMPREHENSIVE METABOLIC PANEL  LIPASE, BLOOD  HCG, SERUM, QUALITATIVE  TROPONIN I (HIGH SENSITIVITY)    EKG EKG Interpretation  Date/Time:  Wednesday October 05 2021 12:22:57 EDT Ventricular Rate:  70 PR Interval:  180 QRS Duration: 82 QT Interval:  374 QTC Calculation: 404 R Axis:   89 Text Interpretation: Sinus rhythm Anterior infarct, old Borderline ST elevation, lateral leads Confirmed by Octaviano Glow [phone]) on 10/05/2021 12:32:17 PM  Radiology No results found.  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  This has been ongoing intermittently for several days.  She does report that since her coronary dissection, she has had episodes that she attributes to anxiety, but she will often have chest pain, and it rapidly improves after taking Xanax at home.  She did take Xanax today and the pain came down from a 10 to a 6 intensity, but it is still present.  She denies that this pain feels like her SCAD presentation.  She says that this is an epigastric intense pressure.  It is quite tolerable at the time.  On exam she is afebrile with normal vital signs.  She has no audible cardiac murmurs.  No epigastric tenderness.  She is well-appearing.  Does not appear to be in significant physical distress.  Her EKG per my interpretation shows a normal sinus rhythm no acute ischemic findings.  Her lab work has been unremarkable.  Initial troponin is 2.  Repeat troponin is pending. [MT]  0630 I suspect this presentation could be consistent with anxiety attack, which she has suffered from since her emotionally traumatic hospitalization for SCAD last year.  I  will lower suspicion for recurring SCAD, given that the chest pain does not feel the same to her, her troponins are normal this time and were elevated in the thousands last time, given that she responded favorably to benzos.  I did offer her lorazepam in the ED as I wonder whether she may be experiencing either vasospasms or anxiety, but she did not want to take this medication at this time. [MT]  1601 I have a low suspicion for aortic dissection, pneumonia, pulmonary embolism based on her clinical work-up.  Her x-ray per my review looks unremarkable. [MT]  1440 Repeat trop negative [MT]    Clinical Course User Index [MT]  Wyvonnia Dusky, MD                           Medical Decision Making Amount and/or Complexity of Data Reviewed Labs: ordered. Radiology: ordered.   Medical Decision Making / ED Course   This patient presents to the ED for concern of chest pain, this involves an extensive number of treatment options, and is a complaint that carries with it a high risk of complications and morbidity.  The differential diagnosis includes ACS, anxiety, pneumonia, MSK pain, PE  MDM: 36 year old female with past medical history significant for hypertension, SCAD presents today for evaluation of chest pain.  Worsened while at work today.  Reports pain improved after taking Xanax.  Patient during previous episode of scad and elevations in troponin.  Reports the chest pain feels different compared to that episode.  Work-up today reveals CBC which is unremarkable, CMP without acute concerns.  Lipase within normal limits.  Troponin negative x2.  Chest x-ray without acute cardiopulmonary process.  EKG without acute ischemic changes.  Unlikely to be ACS.  Offered Ativan which patient defers.  Discussed importance of follow-up with cardiologist and PCP.  Plan discussed with attending Dr. Langston Masker, who agrees low suspicion for ACS, scad.  Recommends increasing benzo dose tonight and follow-up with cardiology.   Patient is in agreement with his plan.   Additional history obtained: -Additional history obtained from admission from last year where patient was admitted for NSTEMI.  She was started on CCB.  Patient is compliant with this therapy. -External records from outside source obtained and reviewed including: Chart review including previous notes, labs, imaging, consultation notes   Lab Tests: -I ordered, reviewed, and interpreted labs.   The pertinent results include:   Labs Reviewed  COMPREHENSIVE METABOLIC PANEL - Abnormal; Notable for the following components:      Result Value   Glucose, Bld 111 (*)    AST 14 (*)    Total Bilirubin 0.2 (*)    All other components within normal limits  CBC WITH DIFFERENTIAL/PLATELET  LIPASE, BLOOD  HCG, SERUM, QUALITATIVE  TROPONIN I (HIGH SENSITIVITY)  TROPONIN I (HIGH SENSITIVITY)      EKG  EKG Interpretation  Date/Time:  Wednesday October 05 2021 12:22:57 EDT Ventricular Rate:  70 PR Interval:  180 QRS Duration: 82 QT Interval:  374 QTC Calculation: 404 R Axis:   89 Text Interpretation: Sinus rhythm Anterior infarct, old Borderline ST elevation, lateral leads Confirmed by Octaviano Glow [phone]) on 10/05/2021 12:32:17 PM         Imaging Studies ordered: I ordered imaging studies including chest x-ray I independently visualized and interpreted imaging. I agree with the radiologist interpretation   Medicines ordered and prescription drug management: Meds ordered this encounter  Medications   DISCONTD: LORazepam (ATIVAN) injection 1 mg    -I have reviewed the patients home medicines and have made adjustments as needed  Cardiac Monitoring: The patient was maintained on a cardiac monitor.  I personally viewed and interpreted the cardiac monitored which showed an underlying rhythm of: Normal sinus rhythm   Co morbidities that complicate the patient evaluation  Past Medical History:  Diagnosis Date   Chronic hypertension  05/18/2019   History of gestational hypertension 06/15/2020   Hx of chlamydia infection 03/2016   Infection    UTI   Postpartum hypertension 07/09/2016   Pregnancy induced hypertension    meds PP for 6-8wks   Uterine fibroid  Dispostion: Patient is appropriate for discharge.  Discharged in stable condition.  Return precautions discussed.   Final Clinical Impression(s) / ED Diagnoses Final diagnoses:  Atypical chest pain    Rx / DC Orders ED Discharge Orders          Ordered    Ambulatory referral to Cardiology       Comments: If you have not heard from the Cardiology office within the next 72 hours please call [phone].   10/05/21 1503              Evlyn Courier, PA-C 10/05/21 1503    Wyvonnia Dusky, MD 10/05/21 [phone]

## 2022-09-28 ENCOUNTER — Encounter (HOSPITAL_COMMUNITY): Payer: Self-pay | Admitting: Emergency Medicine

## 2022-09-28 ENCOUNTER — Other Ambulatory Visit: Payer: Self-pay

## 2022-09-28 ENCOUNTER — Emergency Department (HOSPITAL_COMMUNITY)
Admission: EM | Admit: 2022-09-28 | Discharge: 2022-09-28 | Disposition: A | Discharge status: Home / Self Care | Payer: Managed Care, Other (non HMO) | Attending: Emergency Medicine | Admitting: Emergency Medicine

## 2022-09-28 ENCOUNTER — Emergency Department (HOSPITAL_COMMUNITY): Payer: Managed Care, Other (non HMO)

## 2022-09-28 DIAGNOSIS — I10 Essential (primary) hypertension: Secondary | ICD-10-CM | POA: Diagnosis not present

## 2022-09-28 DIAGNOSIS — R079 Chest pain, unspecified: Secondary | ICD-10-CM | POA: Diagnosis not present

## 2022-09-28 DIAGNOSIS — Z7982 Long term (current) use of aspirin: Secondary | ICD-10-CM | POA: Insufficient documentation

## 2022-09-28 DIAGNOSIS — F419 Anxiety disorder, unspecified: Secondary | ICD-10-CM | POA: Diagnosis not present

## 2022-09-28 DIAGNOSIS — R072 Precordial pain: Secondary | ICD-10-CM | POA: Diagnosis present

## 2022-09-28 LAB — CBC
HCT: 39.1 % (ref 36.0–46.0)
Hemoglobin: 13.2 g/dL (ref 12.0–15.0)
MCH: 29.2 pg (ref 26.0–34.0)
MCHC: 33.8 g/dL (ref 30.0–36.0)
MCV: 86.5 fL (ref 80.0–100.0)
Platelets: 256 10*3/uL (ref 150–400)
RBC: 4.52 MIL/uL (ref 3.87–5.11)
RDW: 12.1 % (ref 11.5–15.5)
WBC: 9.9 10*3/uL (ref 4.0–10.5)
nRBC: 0 % (ref 0.0–0.2)

## 2022-09-28 LAB — TROPONIN I (HIGH SENSITIVITY)
Troponin I (High Sensitivity): 4 ng/L (ref <18)
Troponin I (High Sensitivity): 4 ng/L (ref <18)

## 2022-09-28 LAB — BASIC METABOLIC PANEL
Anion gap: 10 (ref 5–15)
BUN: 8 mg/dL (ref 6–20)
CO2: 25 mmol/L (ref 22–32)
Calcium: 8.9 mg/dL (ref 8.9–10.3)
Chloride: 102 mmol/L (ref 98–111)
Creatinine, Ser: 0.65 mg/dL (ref 0.44–1.00)
GFR, Estimated: >60 mL/min (ref >60)
Glucose, Bld: 140 mg/dL — ABNORMAL HIGH (ref 70–99)
Potassium: 3.7 mmol/L (ref 3.5–5.1)
Sodium: 137 mmol/L (ref 135–145)

## 2022-09-28 LAB — HCG, SERUM, QUALITATIVE: Preg, Serum: NEGATIVE

## 2022-09-28 MED ORDER — ALPRAZOLAM 0.5 MG PO TABS: 0.5000 mg | ORAL_TABLET | Freq: Three times a day (TID) | ORAL | 0 refills | Status: AC | PRN

## 2022-09-28 MED ORDER — ALPRAZOLAM 0.25 MG PO TABS
0.5000 mg | ORAL_TABLET | Freq: Once | ORAL | Status: AC
  Administered 2022-09-28: 0.5 mg via ORAL
  Filled 2022-09-28: qty 2

## 2022-09-28 NOTE — Discharge Instructions (Addendum)
Thank you for allowing [place] to be a part of your care today.  You were evaluated in the ED for chest pain.   Your workup is overall reassuring and does not show evidence of a heart attack or damage to your heart.  I do recommend you follow up with your cardiologist and your primary care provider.  Ask your provider about your blood pressure medication and changing it to something else.   I have sent over a prescription for alprazolam (Xanax) to your pharmacy for you to use when you have feelings of panic or anxiety.  Please follow up with your psychiatrist/prescribing provider as scheduled later this month.   Return to the ED if you develop sudden worsening of your symptoms or if you have any new concerns.

## 2022-09-28 NOTE — ED Provider Notes (Signed)
[facility] AT [facility] Provider Note   CSN: [id number] Arrival date & time: 09/28/22  1428     History  Chief Complaint  Patient presents with   Chest Pain    Anita Potter is a 37 y.o. female with past medical history significant for hypertension, NSTEMI, obesity, anxiety, PTSD presents to the ED complaining of substernal chest pain and pressure that has been going on for the past week.  Patient states that she experiences this pain intermittently, but she has had it every day.  She has associated palpitations.  She states it "feels like something is sitting on my chest".  Patient reports she has felt increase in her anxiety and has been having to take more of her prescribed Xanax.  She does report increased stress in her life also.  Patient states she becomes more anxious with chest pain due to her previous MI.  Denies shortness of breath, chest tightness, leg swelling, syncope, nausea, vomiting, diarrhea, dizziness, lightheadedness.         Home Medications Prior to Admission medications   Medication Sig Start Date End Date Taking? Authorizing Provider  acetaminophen (TYLENOL) 325 MG tablet Take 2 tablets (650 mg total) by mouth every 4 (four) hours as needed (for pain scale < 4). 11/02/17   Paragonah Bing, MD  ALPRAZolam Prudy Feeler) 0.5 MG tablet Take 1 tablet (0.5 mg total) by mouth 3 (three) times daily as needed. 09/28/22   Melton Alar R, PA-C  aspirin EC 81 MG EC tablet Take 1 tablet (81 mg total) by mouth daily. Swallow whole. 06/17/20   Dorcas Carrow, MD  NIFEdipine (ADALAT CC) 30 MG 24 hr tablet Take 1 tablet (30 mg total) by mouth daily. 06/16/20   Dorcas Carrow, MD  Prenatal Vit-Fe Fumarate-FA (MULTIVITAMIN-PRENATAL) 27-0.8 MG TABS tablet Take 1 tablet by mouth daily at 12 noon.    [provider]  sertraline (ZOLOFT) 100 MG tablet Take 1 tablet by mouth daily. 07/27/20   [provider]      Allergies    Patient has  no known allergies.    Review of Systems   Review of Systems  Respiratory:  Negative for chest tightness and shortness of breath.   Cardiovascular:  Positive for chest pain and palpitations. Negative for leg swelling.  Gastrointestinal:  Negative for diarrhea, nausea and vomiting.  Neurological:  Negative for dizziness, syncope and light-headedness.    Physical Exam Updated Vital Signs BP 125/85   Pulse 70   Temp 98.6 F (37 C) (Oral)   Resp 15   Ht 5\' 5"  (1.651 m)   Wt 97.5 kg   LMP 09/03/2022   SpO2 97%   BMI 35.78 kg/m  Physical Exam Vitals and nursing note reviewed.  Constitutional:      General: She is not in acute distress.    Appearance: Normal appearance. She is not ill-appearing or diaphoretic.  Cardiovascular:     Rate and Rhythm: Normal rate and regular rhythm.     Pulses: Normal pulses.     Heart sounds: Normal heart sounds.  Pulmonary:     Effort: Pulmonary effort is normal. No tachypnea or respiratory distress.     Breath sounds: Normal breath sounds and air entry.  Musculoskeletal:     Right lower leg: No edema.     Left lower leg: No edema.  Skin:    General: Skin is warm and dry.     Capillary Refill: Capillary refill takes less than 2  seconds.  Neurological:     Mental Status: She is alert. Mental status is at baseline.  Psychiatric:        Mood and Affect: Mood normal.        Behavior: Behavior normal.     ED Results / Procedures / Treatments   Labs (all labs ordered are listed, but only abnormal results are displayed) Labs Reviewed  BASIC METABOLIC PANEL - Abnormal; Notable for the following components:      Result Value   Glucose, Bld 140 (*)    All other components within normal limits  CBC  HCG, SERUM, QUALITATIVE  TROPONIN I (HIGH SENSITIVITY)  TROPONIN I (HIGH SENSITIVITY)    EKG None  Radiology DG Chest 2 View  Result Date: 09/28/2022 CLINICAL DATA:  Chest pain for 1 week. EXAM: CHEST - 2 VIEW COMPARISON:  Chest radiograph  10/05/2021 FINDINGS: The heart size and mediastinal contours are within normal limits. Both lungs are clear. The visualized skeletal structures are unremarkable. IMPRESSION: No active cardiopulmonary disease. Electronically Signed   By: Neita Garnet M.D.   On: 09/28/2022 16:04    Procedures Procedures    Medications Ordered in ED Medications  ALPRAZolam Prudy Feeler) tablet 0.5 mg (0.5 mg Oral Given 09/28/22 1643)    ED Course/ Medical Decision Making/ A&P                             Medical Decision Making Amount and/or Complexity of Data Reviewed Labs: ordered. Radiology: ordered.  Risk Prescription drug management.   This patient presents to the ED with chief complaint(s) of chest pain and pressure, palpitations with pertinent past medical history of NSTEMI, anxiety, PTSD, obesity.  The complaint involves an extensive differential diagnosis and also carries with it a high risk of complications and morbidity.    The differential diagnosis includes ACS, PE, mediastinitis, myocarditis, pericarditis, pneumothorax, panic attack, costochondritis, stable angina, muscle sprain, somatic chest pain   The initial plan is to obtain chest pain workup  Additional history obtained: Records reviewed  OBGYN visit from 09/07/22.  Patient seen for annual exam.  She stopped breastfeeding.  Patient currently on nifedipine for blood pressure.  Initial Assessment:   Exam significant for overall well appearing patient who is not in acute distress. Heart rate is normal in the 70s with regular rhythm.  Normal sinus on the monitor.  Lungs clear to auscultation bilaterally.  Patient is speaking in full sentences.  No tenderness to palpation of anterior chest wall.  No pedal edema.  Vitals are stable.   Independent ECG/labs interpretation:  The following labs were independently interpreted:  CBC without leukocytosis or anemia.  Metabolic panel without electrolyte disturbance.  Renal function is normal.   Troponins both 4, delta of 0.  Independent visualization and interpretation of imaging: I independently visualized the following imaging with scope of interpretation limited to determining acute life threatening conditions related to emergency care: chest x-ray, which revealed normal cardiac silhouette, no evidence of pneumothorax, infiltrate, or pleural effusion.   Treatment and Reassessment: Patient given 0.5 mg of xanax with improvement in her symptoms.    Disposition:   Patient's workup is overall reassuring and likelihood of ACS very low.  Advised patient to schedule a follow-up appointment with her cardiologist and her primary care provider.  Patient expressed wanting to change her blood pressure medication now that she was no longer breastfeeding.  Recommended she discuss this with either PCP or cardiology.  Patient expresses concern about nearly being out of Xanax and having an increase in her anxiety.  She has appointment scheduled for later this month.  Will send patient home on short course of Xanax to help with anxiety/PTSD related symptoms.   The patient has been appropriately medically screened and/or stabilized in the ED. I have low suspicion for any other emergent medical condition which would require further screening, evaluation or treatment in the ED or require inpatient management. At time of discharge the patient is hemodynamically stable and in no acute distress. I have discussed work-up results and diagnosis with patient and answered all questions. Patient is agreeable with discharge plan. We discussed strict return precautions for returning to the emergency department and they verbalized understanding.            Final Clinical Impression(s) / ED Diagnoses Final diagnoses:  Chest pain, unspecified type  Anxiety    Rx / DC Orders ED Discharge Orders          Ordered    ALPRAZolam (XANAX) 0.5 MG tablet  3 times daily PRN        09/28/22 1840               Lenard Simmer, PA-C 09/28/22 1844    Vanetta Mulders, MD 09/28/22 2251

## 2022-09-28 NOTE — ED Triage Notes (Signed)
Patient arrives ambulatory by POV c/o mid chest pain pressure intermittent over the past week. Pain does not radiate. States it feels like something sitting on her chest. States she feels very anxious and restless.

## 2022-09-28 NOTE — ED Notes (Signed)
Two different nurses attempted access for IV. Unable to get access. IV team consult placed

## 2023-01-18 ENCOUNTER — Ambulatory Visit (HOSPITAL_COMMUNITY)
Admission: EM | Admit: 2023-01-18 | Discharge: 2023-01-18 | Disposition: A | Discharge status: Home / Self Care | Payer: Managed Care, Other (non HMO) | Attending: Family Medicine | Admitting: Family Medicine

## 2023-01-18 ENCOUNTER — Encounter (HOSPITAL_COMMUNITY): Payer: Self-pay | Admitting: Emergency Medicine

## 2023-01-18 ENCOUNTER — Ambulatory Visit (HOSPITAL_COMMUNITY): Payer: Managed Care, Other (non HMO)

## 2023-01-18 DIAGNOSIS — M25531 Pain in right wrist: Secondary | ICD-10-CM

## 2023-01-18 MED ORDER — PREDNISONE 20 MG PO TABS: 40.0000 mg | ORAL_TABLET | Freq: Every day | ORAL | 0 refills | Status: AC

## 2023-01-18 MED ORDER — TRAMADOL HCL 50 MG PO TABS: 50.0000 mg | ORAL_TABLET | Freq: Four times a day (QID) | ORAL | 0 refills | Status: DC | PRN

## 2023-01-18 MED ORDER — KETOROLAC TROMETHAMINE 30 MG/ML IJ SOLN
INTRAMUSCULAR | Status: AC
  Filled 2023-01-18: qty 1

## 2023-01-18 MED ORDER — KETOROLAC TROMETHAMINE 30 MG/ML IJ SOLN
30.0000 mg | Freq: Once | INTRAMUSCULAR | Status: AC
  Administered 2023-01-18: 30 mg via INTRAMUSCULAR

## 2023-01-18 NOTE — Discharge Instructions (Signed)
Here x-rays were negative for broken bones by my review. The radiologist will also read your x-ray, and if their interpretation differs significantly from mine, we will call you.  You have been given a shot of Toradol 30 mg today.  Take prednisone 20 mg--2 daily for 5 days  Take tramadol 50 mg-- 1 tablet every 6 hours as needed for pain.  This medication can make you sleepy or dizzy You can also take Tylenol with the tramadol as needed for pain.

## 2023-01-18 NOTE — ED Provider Notes (Signed)
[facility]    CSN: [id number] Arrival date & time: 01/18/23  1749      History   Chief Complaint Chief Complaint  Patient presents with   Wrist Pain    HPI Anita Potter is a 37 y.o. female.    Wrist Pain  Here for right wrist pain. It began about 10/29, and has worsened since first noted. She wonders if it is from lifting her heavy 35 yr old when he had an ankle sprain, but her toddler did kick her in this wrist accidentally also. No other fall/trauma.  She has been taking tylenol and ibuprofen as needed.  Past Medical History:  Diagnosis Date   Chronic hypertension 05/18/2019   History of gestational hypertension 06/15/2020   Hx of chlamydia infection 03/2016   Infection    UTI   Postpartum hypertension 07/09/2016   Pregnancy induced hypertension    meds PP for 6-8wks   Uterine fibroid     Patient Active Problem List   Diagnosis Date Noted   NSTEMI (non-ST elevated myocardial infarction) (HCC) 06/15/2020   History of gestational hypertension 06/15/2020   Nicotine dependence, cigarettes, uncomplicated 06/15/2020   Chest pain 06/15/2020   Elevated troponin    Spontaneous dissection of coronary artery    Encounter for induction of labor 04/09/2020   SVD (spontaneous vaginal delivery) 04/09/2020   Essential hypertension 05/18/2019   Hypertension in pregnancy, antepartum 10/31/2017   Obesity (BMI 35.0-39.9 without comorbidity) 04/25/2017   Vertigo 08/15/2013    Past Surgical History:  Procedure Laterality Date   CARDIAC CATHETERIZATION  06/15/2020   LEFT HEART CATH AND CORONARY ANGIOGRAPHY N/A 06/15/2020   Procedure: LEFT HEART CATH AND CORONARY ANGIOGRAPHY;  Surgeon: Corky Crafts, MD;  Location: [facility];  Service: Cardiovascular;  Laterality: N/A;   NO PAST SURGERIES      OB History     Gravida  4   Para  3   Term  3   Preterm      AB  1   Living  3      SAB  1   IAB      Ectopic      Multiple  0    Live Births  3            Home Medications    Prior to Admission medications   Medication Sig Start Date End Date Taking? Authorizing Provider  amLODipine (NORVASC) 5 MG tablet Take 5 mg by mouth daily. 01/17/23  Yes [provider]  predniSONE (DELTASONE) 20 MG tablet Take 2 tablets (40 mg total) by mouth daily with breakfast for 5 days. 01/18/23 01/23/23 Yes Lindsey Demonte, Janace Aris, MD  traMADol (ULTRAM) 50 MG tablet Take 1 tablet (50 mg total) by mouth every 6 (six) hours as needed (pain). 01/18/23  Yes Zenia Resides, MD  acetaminophen (TYLENOL) 325 MG tablet Take 2 tablets (650 mg total) by mouth every 4 (four) hours as needed (for pain scale < 4). 11/02/17    Bing, MD  ALPRAZolam Prudy Feeler) 0.5 MG tablet Take 1 tablet (0.5 mg total) by mouth 3 (three) times daily as needed. 09/28/22   Melton Alar R, PA-C  aspirin EC 81 MG EC tablet Take 1 tablet (81 mg total) by mouth daily. Swallow whole. 06/17/20   Dorcas Carrow, MD  NIFEdipine (ADALAT CC) 30 MG 24 hr tablet Take 1 tablet (30 mg total) by mouth daily. 06/16/20   Dorcas Carrow, MD  Prenatal Vit-Fe Fumarate-FA (  MULTIVITAMIN-PRENATAL) 27-0.8 MG TABS tablet Take 1 tablet by mouth daily at 12 noon.    [provider]  sertraline (ZOLOFT) 100 MG tablet Take 1 tablet by mouth daily. 07/27/20   [provider]    Family History Family History  Problem Relation Age of Onset   Hypertension Mother    Lung cancer Father     Social History Social History   Tobacco Use   Smoking status: Every Day    Current packs/day: 0.33    Types: Cigarettes   Smokeless tobacco: Never   Tobacco comments:    Patient given phone number for 1-800-quit-now  Vaping Use   Vaping status: Never Used  Substance Use Topics   Alcohol use: Not Currently    Comment: none since pregnancy   Drug use: No     Allergies   Patient has no known allergies.   Review of Systems Review of Systems   Physical Exam Triage  Vital Signs ED Triage Vitals  Encounter Vitals Group     BP 01/18/23 1832 131/82     Systolic BP Percentile --      Diastolic BP Percentile --      Pulse Rate 01/18/23 1832 79     Resp 01/18/23 1832 17     Temp 01/18/23 1832 98.9 F (37.2 C)     Temp Source 01/18/23 1832 Oral     SpO2 01/18/23 1832 98 %     Weight --      Height --      Head Circumference --      Peak Flow --      Pain Score 01/18/23 1830 10     Pain Loc --      Pain Education --      Exclude from Growth Chart --    No data found.  Updated Vital Signs BP 131/82 (BP Location: Left Arm)   Pulse 79   Temp 98.9 F (37.2 C) (Oral)   Resp 17   LMP 01/12/2023   SpO2 98%   Breastfeeding No   Visual Acuity Right Eye Distance:   Left Eye Distance:   Bilateral Distance:    Right Eye Near:   Left Eye Near:    Bilateral Near:     Physical Exam Vitals reviewed.  Constitutional:      General: She is not in acute distress.    Appearance: She is not ill-appearing, toxic-appearing or diaphoretic.  Musculoskeletal:     Comments: There is tenderness and swelling over the dorsal surface of the right wrist. No ecchymosis or erythema  Skin:    Coloration: Skin is not pale.  Neurological:     Mental Status: She is alert and oriented to person, place, and time.  Psychiatric:        Behavior: Behavior normal.      UC Treatments / Results  Labs (all labs ordered are listed, but only abnormal results are displayed) Labs Reviewed - No data to display  EKG   Radiology No results found.  Procedures Procedures (including critical care time)  Medications Ordered in UC Medications  ketorolac (TORADOL) 30 MG/ML injection 30 mg (has no administration in time range)    Initial Impression / Assessment and Plan / UC Course  I have reviewed the triage vital signs and the nursing notes.  Pertinent labs & imaging results that were available during my care of the patient were reviewed by me and considered in my  medical decision making (see chart  for details).     My review there is no fracture on the x-ray.  She is advised of radiology overread.  A wrist brace is supplied from here, and Toradol injections given.  5-day burst of prednisone is sent in for possible tendinitis, and tramadol was sent in for pain Final Clinical Impressions(s) / UC Diagnoses   Final diagnoses:  Right wrist pain     Discharge Instructions      Here x-rays were negative for broken bones by my review. The radiologist will also read your x-ray, and if their interpretation differs significantly from mine, we will call you.  You have been given a shot of Toradol 30 mg today.  Take prednisone 20 mg--2 daily for 5 days  Take tramadol 50 mg-- 1 tablet every 6 hours as needed for pain.  This medication can make you sleepy or dizzy You can also take Tylenol with the tramadol as needed for pain.     ED Prescriptions     Medication Sig Dispense Auth. Provider   traMADol (ULTRAM) 50 MG tablet Take 1 tablet (50 mg total) by mouth every 6 (six) hours as needed (pain). 12 tablet Lucus Lambertson, Janace Aris, MD   predniSONE (DELTASONE) 20 MG tablet Take 2 tablets (40 mg total) by mouth daily with breakfast for 5 days. 10 tablet Marlinda Mike Janace Aris, MD      I have reviewed the PDMP during this encounter.   Zenia Resides, MD 01/18/23 [phone]

## 2023-01-18 NOTE — ED Triage Notes (Signed)
Pt reports that Monday had to lift her son who is 66 pounds. Tuesday right wrist started hurting. Wearing a wrist brace starting today but not helped. Tried icing area and ibuprofen.

## 2023-03-12 ENCOUNTER — Encounter (HOSPITAL_COMMUNITY): Payer: Self-pay

## 2023-03-12 ENCOUNTER — Ambulatory Visit (HOSPITAL_COMMUNITY)
Admission: EM | Admit: 2023-03-12 | Discharge: 2023-03-12 | Disposition: A | Discharge status: Home / Self Care | Payer: Self-pay | Attending: Internal Medicine | Admitting: Internal Medicine

## 2023-03-12 DIAGNOSIS — F411 Generalized anxiety disorder: Secondary | ICD-10-CM | POA: Insufficient documentation

## 2023-03-12 DIAGNOSIS — U071 COVID-19: Secondary | ICD-10-CM | POA: Insufficient documentation

## 2023-03-12 DIAGNOSIS — D259 Leiomyoma of uterus, unspecified: Secondary | ICD-10-CM | POA: Insufficient documentation

## 2023-03-12 DIAGNOSIS — R399 Unspecified symptoms and signs involving the genitourinary system: Secondary | ICD-10-CM | POA: Insufficient documentation

## 2023-03-12 DIAGNOSIS — M549 Dorsalgia, unspecified: Secondary | ICD-10-CM | POA: Insufficient documentation

## 2023-03-12 LAB — POCT URINALYSIS DIP (MANUAL ENTRY)
Bilirubin, UA: NEGATIVE
Glucose, UA: NEGATIVE mg/dL
Ketones, POC UA: NEGATIVE mg/dL
Leukocytes, UA: NEGATIVE
Nitrite, UA: NEGATIVE
Protein Ur, POC: 30 mg/dL — AB
Spec Grav, UA: >=1.03 — AB (ref 1.010–1.025)
Urobilinogen, UA: 0.2 U/dL
pH, UA: 5.5 (ref 5.0–8.0)

## 2023-03-12 MED ORDER — NITROFURANTOIN MONOHYD MACRO 100 MG PO CAPS: 100.0000 mg | ORAL_CAPSULE | Freq: Two times a day (BID) | ORAL | 0 refills | Status: AC

## 2023-03-12 NOTE — ED Triage Notes (Signed)
"  I have been having a lot of dysuria, urinary discomfort, cramping, some vaginal irritaiton, no vaginal discharge, just came off cycle yesterday but symptoms seem to be a lot now". No concern for Sti.

## 2023-03-12 NOTE — ED Provider Notes (Signed)
[facility]    CSN: [id number] Arrival date & time: 03/12/23  1925      History   Chief Complaint Chief Complaint  Patient presents with   UTI Symptoms    HPI Anita Potter is a 37 y.o. female.   Patient complains of dysuria, mild abdominal cramping that started last week.  Patient initially attributed symptoms to her menstrual cycle but now has completed her menstrual cycle and is still experiencing increased urinary frequency and feeling like she cannot empty her bladder fully.  She denies fever, chills.  Denies increased vaginal discharge.  She reports some vaginal itching that started today today but this is very mild.  She reports today symptoms feel similar to previous UTIs in the past.    Past Medical History:  Diagnosis Date   Chronic hypertension 05/18/2019   History of gestational hypertension 06/15/2020   Hx of chlamydia infection 03/2016   Infection    UTI   Postpartum hypertension 07/09/2016   Pregnancy induced hypertension    meds PP for 6-8wks   Uterine fibroid     Patient Active Problem List   Diagnosis Date Noted   Anxiety state 03/12/2023   Back pain complicating pregnancy 03/12/2023   COVID-19 03/12/2023   Uterine leiomyoma 03/12/2023   NSTEMI (non-ST elevated myocardial infarction) (HCC) 06/15/2020   History of gestational hypertension 06/15/2020   Nicotine dependence, cigarettes, uncomplicated 06/15/2020   Chest pain 06/15/2020   Elevated troponin    Spontaneous dissection of coronary artery    Encounter for induction of labor 04/09/2020   SVD (spontaneous vaginal delivery) 04/09/2020   Exposure to severe acute respiratory syndrome coronavirus 2 (SARS-CoV-2) 03/15/2020   Essential hypertension 05/18/2019   Hypertension in pregnancy, antepartum 10/31/2017   Obesity (BMI 35.0-39.9 without comorbidity) 04/25/2017   Uterine fibroids affecting pregnancy 04/19/2016   Pregnancy 12/15/2015   Vertigo 08/15/2013    Past Surgical  History:  Procedure Laterality Date   CARDIAC CATHETERIZATION  06/15/2020   LEFT HEART CATH AND CORONARY ANGIOGRAPHY N/A 06/15/2020   Procedure: LEFT HEART CATH AND CORONARY ANGIOGRAPHY;  Surgeon: Corky Crafts, MD;  Location: [facility];  Service: Cardiovascular;  Laterality: N/A;   NO PAST SURGERIES      OB History     Gravida  4   Para  3   Term  3   Preterm      AB  1   Living  3      SAB  1   IAB      Ectopic      Multiple  0   Live Births  3            Home Medications    Prior to Admission medications   Medication Sig Start Date End Date Taking? Authorizing Provider  amLODipine (NORVASC) 5 MG tablet Take 5 mg by mouth daily. 01/17/23  Yes [provider]  cyclobenzaprine (FLEXERIL) 5 MG tablet Take 5 mg by mouth 3 (three) times daily as needed. 01/30/23  Yes [provider]  naproxen (NAPROSYN) 500 MG tablet Take 500 mg by mouth 2 (two) times daily. 01/30/23  Yes [provider]  nitrofurantoin, macrocrystal-monohydrate, (MACROBID) 100 MG capsule Take 1 capsule (100 mg total) by mouth 2 (two) times daily for 5 days. 03/12/23 03/17/23 Yes Ward, Tylene Fantasia, PA-C  sertraline (ZOLOFT) 100 MG tablet Take 1 tablet by mouth daily. 07/27/20  Yes [provider]  acetaminophen (TYLENOL) 325 MG tablet Take 2  tablets (650 mg total) by mouth every 4 (four) hours as needed (for pain scale < 4). 11/02/17   Kinsman Bing, MD  ALPRAZolam Prudy Feeler) 0.5 MG tablet Take 1 tablet (0.5 mg total) by mouth 3 (three) times daily as needed. 09/28/22   Melton Alar R, PA-C  aspirin EC 81 MG EC tablet Take 1 tablet (81 mg total) by mouth daily. Swallow whole. 06/17/20   Dorcas Carrow, MD  aspirin EC 81 MG tablet Take 1 tablet by mouth 5 (five) times daily.    [provider]  ibuprofen (ADVIL) 600 MG tablet Take 1 tablet by mouth every 6 (six) hours.    [provider]  NIFEdipine (ADALAT CC) 30 MG 24 hr tablet Take 1  tablet (30 mg total) by mouth daily. 06/16/20   Dorcas Carrow, MD  NIFEdipine (PROCARDIA-XL/NIFEDICAL-XL) 30 MG 24 hr tablet Take 30 mg by mouth daily.    [provider]  Prenatal Vit-Fe Fumarate-FA (MULTIVITAMIN-PRENATAL) 27-0.8 MG TABS tablet Take 1 tablet by mouth daily at 12 noon.    [provider]  traMADol (ULTRAM) 50 MG tablet Take 1 tablet (50 mg total) by mouth every 6 (six) hours as needed (pain). 01/18/23   Zenia Resides, MD    Family History Family History  Problem Relation Age of Onset   Hypertension Mother    Lung cancer Father     Social History Social History   Tobacco Use   Smoking status: Every Day    Current packs/day: 0.33    Types: Cigarettes   Smokeless tobacco: Never   Tobacco comments:    Patient given phone number for 1-800-quit-now  Vaping Use   Vaping status: Never Used  Substance Use Topics   Alcohol use: Not Currently   Drug use: No     Allergies   Patient has no known allergies.   Review of Systems Review of Systems  Constitutional:  Negative for chills and fever.  HENT:  Negative for ear pain and sore throat.   Eyes:  Negative for pain and visual disturbance.  Respiratory:  Negative for cough and shortness of breath.   Cardiovascular:  Negative for chest pain and palpitations.  Gastrointestinal:  Negative for abdominal pain and vomiting.  Genitourinary:  Positive for decreased urine volume, dysuria and frequency. Negative for hematuria.  Musculoskeletal:  Negative for arthralgias and back pain.  Skin:  Negative for color change and rash.  Neurological:  Negative for seizures and syncope.  All other systems reviewed and are negative.    Physical Exam Triage Vital Signs ED Triage Vitals  Encounter Vitals Group     BP 03/12/23 2010 118/82     Systolic BP Percentile --      Diastolic BP Percentile --      Pulse Rate 03/12/23 2010 80     Resp 03/12/23 2010 18     Temp 03/12/23 2010 98.2 F (36.8 C)      Temp Source 03/12/23 2010 Oral     SpO2 03/12/23 2010 99 %     Weight 03/12/23 2007 240 lb (108.9 kg)     Height 03/12/23 2007 5\' 5"  (1.651 m)     Head Circumference --      Peak Flow --      Pain Score 03/12/23 2003 5     Pain Loc --      Pain Education --      Exclude from Growth Chart --    No data found.  Updated  Vital Signs BP 118/82 (BP Location: Right Arm)   Pulse 80   Temp 98.2 F (36.8 C) (Oral)   Resp 18   Ht 5\' 5"  (1.651 m)   Wt 240 lb (108.9 kg)   LMP 03/07/2023 (Exact Date)   SpO2 99%   BMI 39.94 kg/m   Visual Acuity Right Eye Distance:   Left Eye Distance:   Bilateral Distance:    Right Eye Near:   Left Eye Near:    Bilateral Near:     Physical Exam Vitals and nursing note reviewed.  Constitutional:      General: She is not in acute distress.    Appearance: She is well-developed.  HENT:     Head: Normocephalic and atraumatic.  Eyes:     Conjunctiva/sclera: Conjunctivae normal.  Cardiovascular:     Rate and Rhythm: Normal rate and regular rhythm.     Heart sounds: No murmur heard. Pulmonary:     Effort: Pulmonary effort is normal. No respiratory distress.     Breath sounds: Normal breath sounds.  Abdominal:     Palpations: Abdomen is soft.     Tenderness: There is no abdominal tenderness.  Musculoskeletal:        General: No swelling.     Cervical back: Neck supple.  Skin:    General: Skin is warm and dry.     Capillary Refill: Capillary refill takes less than 2 seconds.  Neurological:     Mental Status: She is alert.  Psychiatric:        Mood and Affect: Mood normal.      UC Treatments / Results  Labs (all labs ordered are listed, but only abnormal results are displayed) Labs Reviewed  POCT URINALYSIS DIP (MANUAL ENTRY) - Abnormal; Notable for the following components:      Result Value   Spec Grav, UA >=1.030 (*)    Blood, UA moderate (*)    Protein Ur, POC =30 (*)    All other components within normal limits  URINE CULTURE   CERVICOVAGINAL ANCILLARY ONLY    EKG   Radiology No results found.  Procedures Procedures (including critical care time)  Medications Ordered in UC Medications - No data to display  Initial Impression / Assessment and Plan / UC Course  I have reviewed the triage vital signs and the nursing notes.  Pertinent labs & imaging results that were available during my care of the patient were reviewed by me and considered in my medical decision making (see chart for details).     Will treat for UTI based on symptoms, UA is not abnormal will send culture.  Cervicovaginal self swab in clinic today as well.  Will change treatment plan if indicated based on these test results.  Patient agrees with treatment plan and will start Macrobid.  Return precautions discussed. Final Clinical Impressions(s) / UC Diagnoses   Final diagnoses:  UTI symptoms     Discharge Instructions      Take antibiotic as prescribed Will call with lab results and change treatment plan if indicated Drink plenty of fluids   ED Prescriptions     Medication Sig Dispense Auth. Provider   nitrofurantoin, macrocrystal-monohydrate, (MACROBID) 100 MG capsule Take 1 capsule (100 mg total) by mouth 2 (two) times daily for 5 days. 10 capsule Ward, Tylene Fantasia, PA-C      PDMP not reviewed this encounter.   Ward, Tylene Fantasia, PA-C 03/12/23 2029

## 2023-03-12 NOTE — Discharge Instructions (Signed)
Take antibiotic as prescribed Will call with lab results and change treatment plan if indicated Drink plenty of fluids

## 2023-03-13 LAB — CERVICOVAGINAL ANCILLARY ONLY
Bacterial Vaginitis (gardnerella): NEGATIVE
Candida Glabrata: NEGATIVE
Candida Vaginitis: NEGATIVE
Chlamydia: NEGATIVE
Comment: NEGATIVE
Comment: NEGATIVE
Comment: NEGATIVE
Comment: NEGATIVE
Comment: NEGATIVE
Comment: NORMAL
Neisseria Gonorrhea: NEGATIVE
Trichomonas: NEGATIVE

## 2023-03-14 LAB — URINE CULTURE

## 2023-05-27 ENCOUNTER — Encounter (HOSPITAL_COMMUNITY): Payer: Self-pay | Admitting: Emergency Medicine

## 2023-05-27 ENCOUNTER — Other Ambulatory Visit: Payer: Self-pay

## 2023-05-27 ENCOUNTER — Ambulatory Visit (HOSPITAL_COMMUNITY)
Admission: EM | Admit: 2023-05-27 | Discharge: 2023-05-27 | Disposition: A | Discharge status: Home / Self Care | Attending: Family Medicine | Admitting: Family Medicine

## 2023-05-27 DIAGNOSIS — R102 Pelvic and perineal pain: Secondary | ICD-10-CM | POA: Diagnosis not present

## 2023-05-27 DIAGNOSIS — Z3202 Encounter for pregnancy test, result negative: Secondary | ICD-10-CM | POA: Diagnosis not present

## 2023-05-27 LAB — POCT URINE PREGNANCY: Preg Test, Ur: NEGATIVE

## 2023-05-27 NOTE — ED Provider Notes (Signed)
 [facility]    CSN: [id number] Arrival date & time: 05/27/23  1218      History   Chief Complaint Chief Complaint  Patient presents with   Abdominal Pain    HPI Anita Potter is a 38 y.o. female.    Abdominal Pain Here for lower abdominal pain that began yesterday.  She started having some vaginal bleeding yesterday, when her menstrual cycle was supposed to start.  It is lighter than usual at this time; usually by this time she is having heavier menstrual flow.  No dysuria and prior to the bleeding, she did not have any vaginal discharge or itching.  She usually does not have cramps with her menstrual cycle.  No fever or vomiting.  NKDA  Past Medical History:  Diagnosis Date   Chronic hypertension 05/18/2019   History of gestational hypertension 06/15/2020   Hx of chlamydia infection 03/2016   Infection    UTI   Postpartum hypertension 07/09/2016   Pregnancy induced hypertension    meds PP for 6-8wks   Uterine fibroid     Patient Active Problem List   Diagnosis Date Noted   Anxiety state 03/12/2023   Back pain complicating pregnancy 03/12/2023   COVID-19 03/12/2023   Uterine leiomyoma 03/12/2023   NSTEMI (non-ST elevated myocardial infarction) (HCC) 06/15/2020   History of gestational hypertension 06/15/2020   Nicotine dependence, cigarettes, uncomplicated 06/15/2020   Chest pain 06/15/2020   Elevated troponin    Spontaneous dissection of coronary artery    Encounter for induction of labor 04/09/2020   SVD (spontaneous vaginal delivery) 04/09/2020   Exposure to severe acute respiratory syndrome coronavirus 2 (SARS-CoV-2) 03/15/2020   Essential hypertension 05/18/2019   Hypertension in pregnancy, antepartum 10/31/2017   Obesity (BMI 35.0-39.9 without comorbidity) 04/25/2017   Uterine fibroids affecting pregnancy 04/19/2016   Pregnancy 12/15/2015   Vertigo 08/15/2013    Past Surgical History:  Procedure Laterality Date   CARDIAC  CATHETERIZATION  06/15/2020   LEFT HEART CATH AND CORONARY ANGIOGRAPHY N/A 06/15/2020   Procedure: LEFT HEART CATH AND CORONARY ANGIOGRAPHY;  Surgeon: Corky Crafts, MD;  Location: [facility];  Service: Cardiovascular;  Laterality: N/A;   NO PAST SURGERIES      OB History     Gravida  4   Para  3   Term  3   Preterm      AB  1   Living  3      SAB  1   IAB      Ectopic      Multiple  0   Live Births  3            Home Medications    Prior to Admission medications   Medication Sig Start Date End Date Taking? Authorizing Provider  acetaminophen (TYLENOL) 325 MG tablet Take 2 tablets (650 mg total) by mouth every 4 (four) hours as needed (for pain scale < 4). 11/02/17   [place] Bing, MD  ALPRAZolam Prudy Feeler) 0.5 MG tablet Take 1 tablet (0.5 mg total) by mouth 3 (three) times daily as needed. 09/28/22   Clark, Meghan R, PA-C  amLODipine (NORVASC) 5 MG tablet Take 5 mg by mouth daily. 01/17/23   [provider]  aspirin EC 81 MG EC tablet Take 1 tablet (81 mg total) by mouth daily. Swallow whole. 06/17/20   Dorcas Carrow, MD  aspirin EC 81 MG tablet Take 1 tablet by mouth 5 (five) times daily. Patient not taking: Reported  on 05/27/2023    [provider]  Prenatal Vit-Fe Fumarate-FA (MULTIVITAMIN-PRENATAL) 27-0.8 MG TABS tablet Take 1 tablet by mouth daily at 12 noon.    [provider]  sertraline (ZOLOFT) 100 MG tablet Take 1 tablet by mouth daily. 07/27/20   [provider]    Family History Family History  Problem Relation Age of Onset   Hypertension Mother    Lung cancer Father     Social History Social History   Tobacco Use   Smoking status: Every Day    Current packs/day: 0.33    Types: Cigarettes   Smokeless tobacco: Never   Tobacco comments:    Patient given phone number for 1-800-quit-now  Vaping Use   Vaping status: Never Used  Substance Use Topics   Alcohol use: Not Currently   Drug use: No      Allergies   Patient has no known allergies.   Review of Systems Review of Systems  Gastrointestinal:  Positive for abdominal pain.     Physical Exam Triage Vital Signs ED Triage Vitals  Encounter Vitals Group     BP 05/27/23 1248 125/83     Systolic BP Percentile --      Diastolic BP Percentile --      Pulse Rate 05/27/23 1248 81     Resp 05/27/23 1248 18     Temp 05/27/23 1248 98.2 F (36.8 C)     Temp Source 05/27/23 1248 Oral     SpO2 05/27/23 1248 96 %     Weight --      Height --      Head Circumference --      Peak Flow --      Pain Score 05/27/23 1242 7     Pain Loc --      Pain Education --      Exclude from Growth Chart --    No data found.  Updated Vital Signs BP 125/83 (BP Location: Left Arm) Comment (BP Location): large cuff  Pulse 81   Temp 98.2 F (36.8 C) (Oral)   Resp 18   LMP 04/30/2023 Comment: 04/30/2023 was one week late.  today, period is on time, but not normal presentation  SpO2 96%   Visual Acuity Right Eye Distance:   Left Eye Distance:   Bilateral Distance:    Right Eye Near:   Left Eye Near:    Bilateral Near:     Physical Exam Vitals reviewed.  Constitutional:      General: She is not in acute distress.    Appearance: She is not ill-appearing, toxic-appearing or diaphoretic.  HENT:     Mouth/Throat:     Mouth: Mucous membranes are moist.  Cardiovascular:     Rate and Rhythm: Normal rate and regular rhythm.     Heart sounds: No murmur heard. Pulmonary:     Effort: Pulmonary effort is normal.     Breath sounds: Normal breath sounds.  Abdominal:     General: There is no distension.     Palpations: Abdomen is soft.     Tenderness: There is no guarding.     Comments: There is some mild tenderness in both lower quadrants.  Musculoskeletal:     Cervical back: Neck supple.  Lymphadenopathy:     Cervical: No cervical adenopathy.  Skin:    Capillary Refill: Capillary refill takes less than 2 seconds.     Coloration:  Skin is not jaundiced or pale.  Neurological:  General: No focal deficit present.     Mental Status: She is alert and oriented to person, place, and time.  Psychiatric:        Behavior: Behavior normal.      UC Treatments / Results  Labs (all labs ordered are listed, but only abnormal results are displayed) Labs Reviewed  POCT URINE PREGNANCY - Normal  CERVICOVAGINAL ANCILLARY ONLY    EKG   Radiology No results found.  Procedures Procedures (including critical care time)  Medications Ordered in UC Medications - No data to display  Initial Impression / Assessment and Plan / UC Course  I have reviewed the triage vital signs and the nursing notes.  Pertinent labs & imaging results that were available during my care of the patient were reviewed by me and considered in my medical decision making (see chart for details).     She declines my offer of a Toradol injection.  Ibuprofen 800 mg is sent in for pain.  Vaginal self swab is done, and we will notify of any positives on that and treat per protocol.  She currently does not have a primary care due to some insurance changes.  She is given instructions on how to set up primary care on the [facility] health website. She is also given contact information for OB/GYN  I have asked her to please go to the emergency room if her pain intensifies.  Final Clinical Impressions(s) / UC Diagnoses   Final diagnoses:  Pelvic pain in female     Discharge Instructions      The pregnancy test was negative.  Take ibuprofen 800 mg--1 tab every 8 hours as needed for pain.  Staff will notify you if there is anything positive on the swab.  Please go to the emergency room if you worsen  You can use the QR code/website at the back of the summary paperwork to schedule yourself a new patient appointment with primary care      ED Prescriptions   None    PDMP not reviewed this encounter.   Zenia Resides, MD 05/27/23  1320

## 2023-05-27 NOTE — ED Triage Notes (Signed)
 Lower abdominal pain today.  Reports menstrual cycle started yesterday.  Blood is dark brown, cramping worse than usual.  This is the correct time for cycle, this is just different presentation than usual.  Denies urinary symptoms  Patient is not on any birth control and has NOT recently stopped any birth control

## 2023-05-27 NOTE — Discharge Instructions (Signed)
 The pregnancy test was negative.  Take ibuprofen 800 mg--1 tab every 8 hours as needed for pain.  Staff will notify you if there is anything positive on the swab.  Please go to the emergency room if you worsen  You can use the QR code/website at the back of the summary paperwork to schedule yourself a new patient appointment with primary care

## 2023-05-28 LAB — CERVICOVAGINAL ANCILLARY ONLY
Chlamydia: NEGATIVE
Comment: NEGATIVE
Comment: NEGATIVE
Comment: NORMAL
Neisseria Gonorrhea: NEGATIVE
Trichomonas: NEGATIVE

## 2023-06-06 DIAGNOSIS — R635 Abnormal weight gain: Secondary | ICD-10-CM | POA: Diagnosis not present

## 2023-07-23 DIAGNOSIS — Z01411 Encounter for gynecological examination (general) (routine) with abnormal findings: Secondary | ICD-10-CM | POA: Diagnosis not present

## 2023-07-23 DIAGNOSIS — N898 Other specified noninflammatory disorders of vagina: Secondary | ICD-10-CM | POA: Diagnosis not present

## 2023-07-23 DIAGNOSIS — Z113 Encounter for screening for infections with a predominantly sexual mode of transmission: Secondary | ICD-10-CM | POA: Diagnosis not present

## 2023-07-23 DIAGNOSIS — Z124 Encounter for screening for malignant neoplasm of cervix: Secondary | ICD-10-CM | POA: Diagnosis not present

## 2023-08-04 DIAGNOSIS — K529 Noninfective gastroenteritis and colitis, unspecified: Secondary | ICD-10-CM | POA: Diagnosis not present

## 2023-08-05 ENCOUNTER — Emergency Department (HOSPITAL_BASED_OUTPATIENT_CLINIC_OR_DEPARTMENT_OTHER)
Admission: EM | Admit: 2023-08-05 | Discharge: 2023-08-05 | Disposition: A | Discharge status: Home / Self Care | Attending: Emergency Medicine | Admitting: Emergency Medicine

## 2023-08-05 ENCOUNTER — Other Ambulatory Visit: Payer: Self-pay

## 2023-08-05 ENCOUNTER — Emergency Department (HOSPITAL_BASED_OUTPATIENT_CLINIC_OR_DEPARTMENT_OTHER)

## 2023-08-05 ENCOUNTER — Encounter (HOSPITAL_BASED_OUTPATIENT_CLINIC_OR_DEPARTMENT_OTHER): Payer: Self-pay

## 2023-08-05 DIAGNOSIS — Z7982 Long term (current) use of aspirin: Secondary | ICD-10-CM | POA: Diagnosis not present

## 2023-08-05 DIAGNOSIS — I10 Essential (primary) hypertension: Secondary | ICD-10-CM | POA: Diagnosis not present

## 2023-08-05 DIAGNOSIS — Z79899 Other long term (current) drug therapy: Secondary | ICD-10-CM | POA: Insufficient documentation

## 2023-08-05 DIAGNOSIS — R1032 Left lower quadrant pain: Secondary | ICD-10-CM | POA: Diagnosis not present

## 2023-08-05 DIAGNOSIS — R109 Unspecified abdominal pain: Secondary | ICD-10-CM

## 2023-08-05 LAB — COMPREHENSIVE METABOLIC PANEL WITH GFR
ALT: 10 U/L (ref 0–44)
AST: 16 U/L (ref 15–41)
Albumin: 4.2 g/dL (ref 3.5–5.0)
Alkaline Phosphatase: 95 U/L (ref 38–126)
Anion gap: 12 (ref 5–15)
BUN: 6 mg/dL (ref 6–20)
CO2: 24 mmol/L (ref 22–32)
Calcium: 9.3 mg/dL (ref 8.9–10.3)
Chloride: 103 mmol/L (ref 98–111)
Creatinine, Ser: 0.61 mg/dL (ref 0.44–1.00)
GFR, Estimated: >60 mL/min (ref >60)
Glucose, Bld: 82 mg/dL (ref 70–99)
Potassium: 3.8 mmol/L (ref 3.5–5.1)
Sodium: 140 mmol/L (ref 135–145)
Total Bilirubin: 0.3 mg/dL (ref 0.0–1.2)
Total Protein: 7.5 g/dL (ref 6.5–8.1)

## 2023-08-05 LAB — URINALYSIS, ROUTINE W REFLEX MICROSCOPIC
Bacteria, UA: NONE SEEN
Bilirubin Urine: NEGATIVE
Glucose, UA: NEGATIVE mg/dL
Ketones, ur: NEGATIVE mg/dL
Leukocytes,Ua: NEGATIVE
Nitrite: NEGATIVE
Protein, ur: NEGATIVE mg/dL
Specific Gravity, Urine: 1.027 (ref 1.005–1.030)
pH: 5.5 (ref 5.0–8.0)

## 2023-08-05 LAB — LIPASE, BLOOD: Lipase: 16 U/L (ref 11–51)

## 2023-08-05 LAB — CBC
HCT: 40.3 % (ref 36.0–46.0)
Hemoglobin: 13.4 g/dL (ref 12.0–15.0)
MCH: 28.5 pg (ref 26.0–34.0)
MCHC: 33.3 g/dL (ref 30.0–36.0)
MCV: 85.6 fL (ref 80.0–100.0)
Platelets: 269 10*3/uL (ref 150–400)
RBC: 4.71 MIL/uL (ref 3.87–5.11)
RDW: 12.8 % (ref 11.5–15.5)
WBC: 8.1 10*3/uL (ref 4.0–10.5)
nRBC: 0 % (ref 0.0–0.2)

## 2023-08-05 LAB — PREGNANCY, URINE: Preg Test, Ur: NEGATIVE

## 2023-08-05 MED ORDER — ONDANSETRON HCL 4 MG/2ML IJ SOLN
4.0000 mg | Freq: Once | INTRAMUSCULAR | Status: AC
  Administered 2023-08-05: 4 mg via INTRAVENOUS
  Filled 2023-08-05: qty 2

## 2023-08-05 MED ORDER — SODIUM CHLORIDE 0.9 % IV BOLUS
1000.0000 mL | Freq: Once | INTRAVENOUS | Status: AC
  Administered 2023-08-05: 1000 mL via INTRAVENOUS

## 2023-08-05 MED ORDER — IOHEXOL 300 MG/ML  SOLN
100.0000 mL | Freq: Once | INTRAMUSCULAR | Status: AC | PRN
  Administered 2023-08-05: 100 mL via INTRAVENOUS

## 2023-08-05 MED ORDER — ONDANSETRON HCL 4 MG PO TABS: 4.0000 mg | ORAL_TABLET | Freq: Four times a day (QID) | ORAL | 0 refills | Status: AC

## 2023-08-05 MED ORDER — MORPHINE SULFATE (PF) 4 MG/ML IV SOLN
4.0000 mg | Freq: Once | INTRAVENOUS | Status: AC
  Administered 2023-08-05: 4 mg via INTRAVENOUS
  Filled 2023-08-05: qty 1

## 2023-08-05 NOTE — ED Notes (Signed)
 Pt unable to obtain a urine sample at this time, pt is aware that one is needed will try again later.

## 2023-08-05 NOTE — ED Provider Notes (Signed)
 [facility] EMERGENCY DEPARTMENT AT [facility] Provider Note   CSN: [id number] Arrival date & time: 08/05/23  1401     History  Chief Complaint  Patient presents with   Abdominal Pain    Anita Potter is a 38 y.o. female.  With a history of uterine fibroids, hypertension and obesity presents to the ED for abdominal pain.  Persistent lower abdominal pain for 1 week.  Was seen in urgent care and treated symptomatically for suspected viral illness.  Symptoms have persisted with cramping sensation over lower abdomen.  Last menstrual period ended on May 5.  No vaginal bleeding or urinary symptoms.  Some nausea but no vomiting ongoing diarrhea for the last several days.   Abdominal Pain      Home Medications Prior to Admission medications   Medication Sig Start Date End Date Taking? Authorizing Provider  dicyclomine  (BENTYL ) 20 MG tablet Take 20 mg by mouth 4 (four) times daily -  before meals and at bedtime. 08/04/23 08/11/23 Yes [provider]  ondansetron  (ZOFRAN ) 4 MG tablet Take 1 tablet (4 mg total) by mouth every 6 (six) hours. 08/05/23  Yes Sallyanne Creamer, DO  acetaminophen  (TYLENOL ) 325 MG tablet Take 2 tablets (650 mg total) by mouth every 4 (four) hours as needed (for pain scale < 4). 11/02/17   Raynell Caller, MD  ALPRAZolam  (XANAX ) 0.5 MG tablet Take 1 tablet (0.5 mg total) by mouth 3 (three) times daily as needed. 09/28/22   Clark, Meghan R, PA-C  amLODipine  (NORVASC ) 5 MG tablet Take 5 mg by mouth daily. 01/17/23   [provider]  aspirin  EC 81 MG EC tablet Take 1 tablet (81 mg total) by mouth daily. Swallow whole. 06/17/20   Vada Garibaldi, MD  aspirin  EC 81 MG tablet Take 1 tablet by mouth 5 (five) times daily. Patient not taking: Reported on 05/27/2023    [provider]  Prenatal Vit-Fe Fumarate-FA (MULTIVITAMIN-PRENATAL) 27-0.8 MG TABS tablet Take 1 tablet by mouth daily at 12 noon.    [provider]  sertraline (ZOLOFT)  100 MG tablet Take 1 tablet by mouth daily. 07/27/20   [provider]      Allergies    Patient has no known allergies.    Review of Systems   Review of Systems  Gastrointestinal:  Positive for abdominal pain.    Physical Exam Updated Vital Signs BP 114/75 (BP Location: Right Arm)   Pulse 68   Temp 98.9 F (37.2 C)   Resp 18   Ht 5\' 5"  (1.651 m)   Wt 111.1 kg   SpO2 96%   BMI 40.77 kg/m  Physical Exam Vitals and nursing note reviewed.  HENT:     Head: Normocephalic and atraumatic.  Eyes:     Pupils: Pupils are equal, round, and reactive to light.  Cardiovascular:     Rate and Rhythm: Normal rate and regular rhythm.  Pulmonary:     Effort: Pulmonary effort is normal.     Breath sounds: Normal breath sounds.  Abdominal:     Palpations: Abdomen is soft.     Tenderness: There is abdominal tenderness in the suprapubic area and left lower quadrant.  Skin:    General: Skin is warm and dry.  Neurological:     Mental Status: She is alert.  Psychiatric:        Mood and Affect: Mood normal.     ED Results / Procedures / Treatments   Labs (all labs ordered are  listed, but only abnormal results are displayed) Labs Reviewed  URINALYSIS, ROUTINE W REFLEX MICROSCOPIC - Abnormal; Notable for the following components:      Result Value   Hgb urine dipstick MODERATE (*)    All other components within normal limits  LIPASE, BLOOD  COMPREHENSIVE METABOLIC PANEL WITH GFR  CBC  PREGNANCY, URINE    EKG None  Radiology CT ABDOMEN PELVIS W CONTRAST Result Date: 08/05/2023 CLINICAL DATA:  Left lower quadrant pain EXAM: CT ABDOMEN AND PELVIS WITH CONTRAST TECHNIQUE: Multidetector CT imaging of the abdomen and pelvis was performed using the standard protocol following bolus administration of intravenous contrast. RADIATION DOSE REDUCTION: This exam was performed according to the departmental dose-optimization program which includes automated exposure control, adjustment  of the mA and/or kV according to patient size and/or use of iterative reconstruction technique. CONTRAST:  OMNIPAQUE  IOHEXOL  300 MG/ML  SOLN COMPARISON:  None Available. FINDINGS: Lower chest: No acute abnormality Hepatobiliary: No focal hepatic abnormality. Gallbladder unremarkable. Pancreas: No focal abnormality or ductal dilatation. Spleen: No focal abnormality.  Normal size. Adrenals/Urinary Tract: No adrenal abnormality. No focal renal abnormality. No stones or hydronephrosis. Urinary bladder is unremarkable. Stomach/Bowel: Normal appendix. Left colonic diverticulosis. No active diverticulitis. Stomach and small bowel decompressed. No bowel obstruction or inflammatory process. Vascular/Lymphatic: No evidence of aneurysm or adenopathy. Reproductive: No visible focal abnormality. Other: No free fluid or free air. Musculoskeletal: No acute bony abnormality. IMPRESSION: No acute findings in the abdomen or pelvis. Left colonic diverticulosis.  No active diverticulitis. Electronically Signed   By: Janeece Mechanic M.D.   On: 08/05/2023 18:09    Procedures Procedures    Medications Ordered in ED Medications  sodium chloride  0.9 % bolus 1,000 mL (0 mLs Intravenous Stopped 08/05/23 1806)  ondansetron  (ZOFRAN ) injection 4 mg (4 mg Intravenous Given 08/05/23 1706)  morphine  (PF) 4 MG/ML injection 4 mg (4 mg Intravenous Given 08/05/23 1708)  iohexol  (OMNIPAQUE ) 300 MG/ML solution 100 mL (100 mLs Intravenous Contrast Given 08/05/23 1724)    ED Course/ Medical Decision Making/ A&P Clinical Course as of 08/05/23 2118  Sun Aug 05, 2023  2117 Laboratory workup notable for microscopic hematuria.  No leukocytosis.  No elevation in LFTs.  Renal function at baseline.  Pregnancy test negative.  CT abdomen pelvis shows no acute findings.  Patient reports relief in her symptoms after fluids morphine  Zofran  here.  She will follow-up with her PCP.  Unclear cause of her abdominal pain but may be related to viral enteritis  [MP]    Clinical Course User Index [MP] Sallyanne Creamer, DO                                 Medical Decision Making 38 year old female with history as above presenting for 1 week of abdominal pain.  Afebrile.  Appears uncomfortable.  Exam notable for suprapubic left lower quadrant tenderness.    Differential diagnosis includes: Acute intra-abdominal infectious/inflammatory process such as appendicitis, diverticulitis, pancreatitis and cholecystitis Urinary tract infection Viral gastroenteritis Uterine fibroids  Will obtain laboratory workup including CBC with differential, metabolic panel, lipase and urinalysis along with CT abdomen pelvis Will give IV fluids Zofran  morphine  for rehydration symptomatic management  Amount and/or Complexity of Data Reviewed Labs: ordered. Radiology: ordered.  Risk Prescription drug management.           Final Clinical Impression(s) / ED Diagnoses Final diagnoses:  Abdominal pain, unspecified abdominal location  Rx / DC Orders ED Discharge Orders          Ordered    ondansetron  (ZOFRAN ) 4 MG tablet  Every 6 hours        08/05/23 2118              Sallyanne Creamer, DO 08/05/23 2118

## 2023-08-05 NOTE — Discharge Instructions (Addendum)
 You were seen in the emergency room for abdominal pain The blood work looked okay The CAT scan did not show any findings that explain your pain Your urine did have a small amount of blood in it but no infection It is unclear what is causing your pain It may be related to a diarrheal viral illness We have called in a prescription for morphine  for you to take as needed for nausea vomiting Follow-up with your primary care doctor in 1 week for reevaluation Return to the emergency department for severe pain or any other concerns

## 2023-08-05 NOTE — ED Triage Notes (Signed)
 Patient arrives POV with complaints of worsening abdominal pain x1 week. Seen by Urgent Care and started on Bentyl  with no relief. Rates pain a 10/10.

## 2023-08-06 DIAGNOSIS — A09 Infectious gastroenteritis and colitis, unspecified: Secondary | ICD-10-CM | POA: Diagnosis not present

## 2023-08-06 DIAGNOSIS — R1084 Generalized abdominal pain: Secondary | ICD-10-CM | POA: Diagnosis not present

## 2023-08-10 DIAGNOSIS — E669 Obesity, unspecified: Secondary | ICD-10-CM | POA: Diagnosis not present

## 2023-08-10 DIAGNOSIS — Z713 Dietary counseling and surveillance: Secondary | ICD-10-CM | POA: Diagnosis not present

## 2023-08-21 DIAGNOSIS — N926 Irregular menstruation, unspecified: Secondary | ICD-10-CM | POA: Diagnosis not present

## 2023-08-21 DIAGNOSIS — I252 Old myocardial infarction: Secondary | ICD-10-CM | POA: Diagnosis not present

## 2023-08-21 DIAGNOSIS — Z6841 Body Mass Index (BMI) 40.0 and over, adult: Secondary | ICD-10-CM | POA: Diagnosis not present

## 2023-08-21 DIAGNOSIS — N939 Abnormal uterine and vaginal bleeding, unspecified: Secondary | ICD-10-CM | POA: Diagnosis not present

## 2023-08-21 DIAGNOSIS — R87618 Other abnormal cytological findings on specimens from cervix uteri: Secondary | ICD-10-CM | POA: Diagnosis not present

## 2023-09-28 DIAGNOSIS — I2542 Coronary artery dissection: Secondary | ICD-10-CM | POA: Diagnosis not present

## 2023-09-28 DIAGNOSIS — I214 Non-ST elevation (NSTEMI) myocardial infarction: Secondary | ICD-10-CM | POA: Diagnosis not present

## 2023-10-08 DIAGNOSIS — E669 Obesity, unspecified: Secondary | ICD-10-CM | POA: Diagnosis not present

## 2023-10-08 DIAGNOSIS — Z713 Dietary counseling and surveillance: Secondary | ICD-10-CM | POA: Diagnosis not present

## 2023-10-25 DIAGNOSIS — E669 Obesity, unspecified: Secondary | ICD-10-CM | POA: Diagnosis not present

## 2023-10-25 DIAGNOSIS — R7303 Prediabetes: Secondary | ICD-10-CM | POA: Diagnosis not present

## 2023-10-25 DIAGNOSIS — I214 Non-ST elevation (NSTEMI) myocardial infarction: Secondary | ICD-10-CM | POA: Diagnosis not present

## 2023-11-01 DIAGNOSIS — I10 Essential (primary) hypertension: Secondary | ICD-10-CM | POA: Diagnosis not present

## 2023-11-01 DIAGNOSIS — F17200 Nicotine dependence, unspecified, uncomplicated: Secondary | ICD-10-CM | POA: Diagnosis not present

## 2023-11-01 DIAGNOSIS — I2542 Coronary artery dissection: Secondary | ICD-10-CM | POA: Diagnosis not present

## 2023-11-01 DIAGNOSIS — I214 Non-ST elevation (NSTEMI) myocardial infarction: Secondary | ICD-10-CM | POA: Diagnosis not present

## 2023-11-08 DIAGNOSIS — N939 Abnormal uterine and vaginal bleeding, unspecified: Secondary | ICD-10-CM | POA: Diagnosis not present

## 2023-11-08 DIAGNOSIS — N920 Excessive and frequent menstruation with regular cycle: Secondary | ICD-10-CM | POA: Diagnosis not present

## 2023-11-08 DIAGNOSIS — Z01818 Encounter for other preprocedural examination: Secondary | ICD-10-CM | POA: Diagnosis not present

## 2023-11-08 DIAGNOSIS — N841 Polyp of cervix uteri: Secondary | ICD-10-CM | POA: Diagnosis not present

## 2023-11-08 DIAGNOSIS — R87619 Unspecified abnormal cytological findings in specimens from cervix uteri: Secondary | ICD-10-CM | POA: Diagnosis not present

## 2023-11-22 DIAGNOSIS — Z713 Dietary counseling and surveillance: Secondary | ICD-10-CM | POA: Diagnosis not present

## 2023-12-25 DIAGNOSIS — G4733 Obstructive sleep apnea (adult) (pediatric): Secondary | ICD-10-CM | POA: Diagnosis not present

## 2023-12-28 DIAGNOSIS — G4733 Obstructive sleep apnea (adult) (pediatric): Secondary | ICD-10-CM | POA: Diagnosis not present

## 2024-01-11 DIAGNOSIS — R7303 Prediabetes: Secondary | ICD-10-CM | POA: Diagnosis not present

## 2024-01-11 DIAGNOSIS — I10 Essential (primary) hypertension: Secondary | ICD-10-CM | POA: Diagnosis not present

## 2024-01-11 DIAGNOSIS — E559 Vitamin D deficiency, unspecified: Secondary | ICD-10-CM | POA: Diagnosis not present

## 2024-01-16 DIAGNOSIS — E66812 Obesity, class 2: Secondary | ICD-10-CM | POA: Diagnosis not present

## 2024-01-16 DIAGNOSIS — Z6839 Body mass index (BMI) 39.0-39.9, adult: Secondary | ICD-10-CM | POA: Diagnosis not present

## 2024-01-16 DIAGNOSIS — Z713 Dietary counseling and surveillance: Secondary | ICD-10-CM | POA: Diagnosis not present

## 2024-02-12 DIAGNOSIS — Z713 Dietary counseling and surveillance: Secondary | ICD-10-CM | POA: Diagnosis not present
